# Patient Record
Sex: Female | Born: 1935 | Race: White | Hispanic: No | Smoking: Never smoker
Health system: Southern US, Community
[De-identification: ages and names within clinical notes are randomized; demographics above are authoritative.]

## PROBLEM LIST (undated history)

## (undated) DIAGNOSIS — N183 Chronic kidney disease, stage 3 (moderate): Secondary | ICD-10-CM

## (undated) DIAGNOSIS — I2510 Atherosclerotic heart disease of native coronary artery without angina pectoris: Secondary | ICD-10-CM

## (undated) DIAGNOSIS — M179 Osteoarthritis of knee, unspecified: Secondary | ICD-10-CM

## (undated) DIAGNOSIS — I255 Ischemic cardiomyopathy: Secondary | ICD-10-CM

## (undated) DIAGNOSIS — E119 Type 2 diabetes mellitus without complications: Secondary | ICD-10-CM

## (undated) DIAGNOSIS — D631 Anemia in chronic kidney disease: Secondary | ICD-10-CM

## (undated) DIAGNOSIS — D638 Anemia in other chronic diseases classified elsewhere: Secondary | ICD-10-CM

## (undated) DIAGNOSIS — C9110 Chronic lymphocytic leukemia of B-cell type not having achieved remission: Secondary | ICD-10-CM

## (undated) DIAGNOSIS — Z5189 Encounter for other specified aftercare: Secondary | ICD-10-CM

## (undated) DIAGNOSIS — D649 Anemia, unspecified: Secondary | ICD-10-CM

## (undated) DIAGNOSIS — R413 Other amnesia: Secondary | ICD-10-CM

## (undated) DIAGNOSIS — E039 Hypothyroidism, unspecified: Secondary | ICD-10-CM

## (undated) DIAGNOSIS — E7800 Pure hypercholesterolemia, unspecified: Secondary | ICD-10-CM

## (undated) DIAGNOSIS — IMO0001 Reserved for inherently not codable concepts without codable children: Secondary | ICD-10-CM

## (undated) DIAGNOSIS — I509 Heart failure, unspecified: Secondary | ICD-10-CM

## (undated) DIAGNOSIS — M1710 Unilateral primary osteoarthritis, unspecified knee: Secondary | ICD-10-CM

## (undated) DIAGNOSIS — I10 Essential (primary) hypertension: Secondary | ICD-10-CM

## (undated) DIAGNOSIS — R0602 Shortness of breath: Secondary | ICD-10-CM

## (undated) HISTORY — DX: Hypothyroidism, unspecified: E03.9

## (undated) HISTORY — DX: Anemia, unspecified: D64.9

## (undated) HISTORY — PX: OTHER SURGICAL HISTORY: SHX169

## (undated) HISTORY — DX: Osteoarthritis of knee, unspecified: M17.9

## (undated) HISTORY — DX: Chronic lymphocytic leukemia of B-cell type not having achieved remission: C91.10

## (undated) HISTORY — DX: Ischemic cardiomyopathy: I25.5

## (undated) HISTORY — DX: Type 2 diabetes mellitus without complications: E11.9

## (undated) HISTORY — DX: Other amnesia: R41.3

## (undated) HISTORY — PX: THYROIDECTOMY: SHX17

## (undated) HISTORY — DX: Unilateral primary osteoarthritis, unspecified knee: M17.10

## (undated) HISTORY — DX: Anemia in chronic kidney disease: D63.1

## (undated) HISTORY — DX: Atherosclerotic heart disease of native coronary artery without angina pectoris: I25.10

## (undated) HISTORY — DX: Anemia in other chronic diseases classified elsewhere: D63.8

## (undated) HISTORY — DX: Pure hypercholesterolemia, unspecified: E78.00

## (undated) HISTORY — PX: EYE SURGERY: SHX253

## (undated) HISTORY — PX: CATARACT EXTRACTION, BILATERAL: SHX1313

## (undated) HISTORY — DX: Chronic kidney disease, stage 3 (moderate): N18.3

## (undated) HISTORY — DX: Essential (primary) hypertension: I10

## (undated) HISTORY — PX: REPLACEMENT TOTAL KNEE BILATERAL: SUR1225

---

## 2000-01-27 HISTORY — PX: COLONOSCOPY: SHX174

## 2007-04-02 ENCOUNTER — Ambulatory Visit: Payer: Self-pay | Admitting: Family Medicine

## 2007-04-02 DIAGNOSIS — H269 Unspecified cataract: Secondary | ICD-10-CM | POA: Insufficient documentation

## 2007-04-02 DIAGNOSIS — E1165 Type 2 diabetes mellitus with hyperglycemia: Secondary | ICD-10-CM

## 2007-04-02 DIAGNOSIS — I10 Essential (primary) hypertension: Secondary | ICD-10-CM

## 2007-04-02 DIAGNOSIS — M129 Arthropathy, unspecified: Secondary | ICD-10-CM | POA: Insufficient documentation

## 2007-04-02 DIAGNOSIS — M545 Low back pain: Secondary | ICD-10-CM

## 2007-04-02 DIAGNOSIS — F329 Major depressive disorder, single episode, unspecified: Secondary | ICD-10-CM

## 2007-04-02 DIAGNOSIS — E039 Hypothyroidism, unspecified: Secondary | ICD-10-CM | POA: Insufficient documentation

## 2007-04-02 DIAGNOSIS — E785 Hyperlipidemia, unspecified: Secondary | ICD-10-CM | POA: Insufficient documentation

## 2007-04-02 LAB — CONVERTED CEMR LAB: Hgb A1c MFr Bld: 8 %

## 2007-04-03 ENCOUNTER — Telehealth (INDEPENDENT_AMBULATORY_CARE_PROVIDER_SITE_OTHER): Payer: Self-pay | Admitting: *Deleted

## 2007-04-03 ENCOUNTER — Encounter (INDEPENDENT_AMBULATORY_CARE_PROVIDER_SITE_OTHER): Payer: Self-pay | Admitting: Family Medicine

## 2007-04-03 DIAGNOSIS — C9110 Chronic lymphocytic leukemia of B-cell type not having achieved remission: Secondary | ICD-10-CM

## 2007-04-03 HISTORY — DX: Chronic lymphocytic leukemia of B-cell type not having achieved remission: C91.10

## 2007-04-03 LAB — CONVERTED CEMR LAB
Basophils Absolute: 0 10*3/uL (ref 0.0–0.1)
Basophils Relative: 0 % (ref 0–1)
Eosinophils Absolute: 0.2 10*3/uL (ref 0.0–0.7)
HCT: 32.7 % — ABNORMAL LOW (ref 36.0–46.0)
Lymphocytes Relative: 76 % — ABNORMAL HIGH (ref 12–46)
Lymphs Abs: 9.6 10*3/uL — ABNORMAL HIGH (ref 0.7–3.3)
MCHC: 31.2 g/dL (ref 30.0–36.0)
Monocytes Absolute: 0.2 10*3/uL (ref 0.2–0.7)
Neutro Abs: 2.6 10*3/uL (ref 1.7–7.7)
RDW: 14.4 % — ABNORMAL HIGH (ref 11.5–14.0)
Retic Ct Pct: 0.9 % (ref 0.4–3.1)
TIBC: 313 ug/dL (ref 250–470)
UIBC: 256 ug/dL
WBC: 12.6 10*3/uL — ABNORMAL HIGH (ref 4.0–10.5)

## 2007-04-08 ENCOUNTER — Encounter (INDEPENDENT_AMBULATORY_CARE_PROVIDER_SITE_OTHER): Payer: Self-pay | Admitting: Family Medicine

## 2007-04-09 ENCOUNTER — Encounter (INDEPENDENT_AMBULATORY_CARE_PROVIDER_SITE_OTHER): Payer: Self-pay | Admitting: Family Medicine

## 2007-04-10 ENCOUNTER — Encounter (INDEPENDENT_AMBULATORY_CARE_PROVIDER_SITE_OTHER): Payer: Self-pay | Admitting: Family Medicine

## 2007-04-15 ENCOUNTER — Ambulatory Visit: Payer: Self-pay | Admitting: Family Medicine

## 2007-04-15 ENCOUNTER — Telehealth (INDEPENDENT_AMBULATORY_CARE_PROVIDER_SITE_OTHER): Payer: Self-pay | Admitting: *Deleted

## 2007-05-14 ENCOUNTER — Encounter (HOSPITAL_COMMUNITY): Payer: Self-pay | Admitting: Oncology

## 2007-05-14 ENCOUNTER — Ambulatory Visit (HOSPITAL_COMMUNITY): Payer: Self-pay | Admitting: Oncology

## 2007-05-14 ENCOUNTER — Encounter (INDEPENDENT_AMBULATORY_CARE_PROVIDER_SITE_OTHER): Payer: Self-pay | Admitting: Family Medicine

## 2007-05-14 ENCOUNTER — Encounter (HOSPITAL_COMMUNITY): Admission: RE | Admit: 2007-05-14 | Discharge: 2007-05-28 | Payer: Self-pay | Admitting: Oncology

## 2007-05-27 ENCOUNTER — Ambulatory Visit: Payer: Self-pay | Admitting: Family Medicine

## 2007-05-27 LAB — CONVERTED CEMR LAB: LDL Goal: 100 mg/dL

## 2007-06-06 ENCOUNTER — Telehealth (INDEPENDENT_AMBULATORY_CARE_PROVIDER_SITE_OTHER): Payer: Self-pay | Admitting: Family Medicine

## 2007-06-23 ENCOUNTER — Ambulatory Visit: Payer: Self-pay | Admitting: Family Medicine

## 2007-07-11 ENCOUNTER — Encounter (HOSPITAL_COMMUNITY): Admission: RE | Admit: 2007-07-11 | Discharge: 2007-08-10 | Payer: Self-pay | Admitting: Oncology

## 2007-07-11 ENCOUNTER — Ambulatory Visit (HOSPITAL_COMMUNITY): Payer: Self-pay | Admitting: Oncology

## 2007-07-16 ENCOUNTER — Encounter (INDEPENDENT_AMBULATORY_CARE_PROVIDER_SITE_OTHER): Payer: Self-pay | Admitting: Family Medicine

## 2007-07-24 ENCOUNTER — Ambulatory Visit: Payer: Self-pay | Admitting: Family Medicine

## 2007-08-28 ENCOUNTER — Encounter (INDEPENDENT_AMBULATORY_CARE_PROVIDER_SITE_OTHER): Payer: Self-pay | Admitting: Family Medicine

## 2007-08-29 ENCOUNTER — Telehealth (INDEPENDENT_AMBULATORY_CARE_PROVIDER_SITE_OTHER): Payer: Self-pay | Admitting: *Deleted

## 2007-08-29 ENCOUNTER — Encounter (INDEPENDENT_AMBULATORY_CARE_PROVIDER_SITE_OTHER): Payer: Self-pay | Admitting: Family Medicine

## 2007-08-29 LAB — CONVERTED CEMR LAB
AST: 17 units/L (ref 0–37)
BUN: 21 mg/dL (ref 6–23)
CO2: 22 meq/L (ref 19–32)
Chloride: 106 meq/L (ref 96–112)
Cholesterol: 152 mg/dL (ref 0–200)
LDL Cholesterol: 87 mg/dL (ref 0–99)
Total Bilirubin: 0.4 mg/dL (ref 0.3–1.2)
Total Protein: 6.7 g/dL (ref 6.0–8.3)
Triglycerides: 116 mg/dL (ref <150)

## 2007-09-04 ENCOUNTER — Ambulatory Visit: Payer: Self-pay | Admitting: Family Medicine

## 2007-09-08 ENCOUNTER — Telehealth (INDEPENDENT_AMBULATORY_CARE_PROVIDER_SITE_OTHER): Payer: Self-pay | Admitting: *Deleted

## 2007-09-11 ENCOUNTER — Telehealth (INDEPENDENT_AMBULATORY_CARE_PROVIDER_SITE_OTHER): Payer: Self-pay | Admitting: *Deleted

## 2007-09-11 ENCOUNTER — Encounter (INDEPENDENT_AMBULATORY_CARE_PROVIDER_SITE_OTHER): Payer: Self-pay | Admitting: Family Medicine

## 2007-09-11 LAB — CONVERTED CEMR LAB: Potassium: 4.5 meq/L (ref 3.5–5.3)

## 2007-11-03 ENCOUNTER — Telehealth (INDEPENDENT_AMBULATORY_CARE_PROVIDER_SITE_OTHER): Payer: Self-pay | Admitting: *Deleted

## 2007-11-06 ENCOUNTER — Ambulatory Visit: Payer: Self-pay | Admitting: Family Medicine

## 2007-11-06 LAB — CONVERTED CEMR LAB
Glucose, Bld: 75 mg/dL
Hgb A1c MFr Bld: 7.6 %

## 2008-01-05 ENCOUNTER — Encounter (HOSPITAL_COMMUNITY): Admission: RE | Admit: 2008-01-05 | Discharge: 2008-02-04 | Payer: Self-pay | Admitting: Oncology

## 2008-01-05 ENCOUNTER — Ambulatory Visit (HOSPITAL_COMMUNITY): Payer: Self-pay | Admitting: Oncology

## 2008-01-12 ENCOUNTER — Ambulatory Visit: Payer: Self-pay | Admitting: Family Medicine

## 2008-01-12 LAB — CONVERTED CEMR LAB: Blood Glucose, Fasting: 206 mg/dL

## 2008-01-14 ENCOUNTER — Encounter (INDEPENDENT_AMBULATORY_CARE_PROVIDER_SITE_OTHER): Payer: Self-pay | Admitting: Family Medicine

## 2008-01-20 ENCOUNTER — Ambulatory Visit (HOSPITAL_COMMUNITY): Admission: RE | Admit: 2008-01-20 | Discharge: 2008-01-20 | Payer: Self-pay | Admitting: Ophthalmology

## 2008-02-17 ENCOUNTER — Ambulatory Visit: Payer: Self-pay | Admitting: Family Medicine

## 2008-02-23 ENCOUNTER — Ambulatory Visit: Payer: Self-pay | Admitting: Family Medicine

## 2008-02-23 LAB — CONVERTED CEMR LAB
Glucose, Bld: 117 mg/dL
Hgb A1c MFr Bld: 7.7 %

## 2008-02-24 ENCOUNTER — Encounter (INDEPENDENT_AMBULATORY_CARE_PROVIDER_SITE_OTHER): Payer: Self-pay | Admitting: Family Medicine

## 2008-02-24 LAB — CONVERTED CEMR LAB
ALT: 12 units/L (ref 0–35)
AST: 22 units/L (ref 0–37)
Alkaline Phosphatase: 71 units/L (ref 39–117)
BUN: 20 mg/dL (ref 6–23)
Basophils Absolute: 0 10*3/uL (ref 0.0–0.1)
Basophils Relative: 0 % (ref 0–1)
Calcium: 9.3 mg/dL (ref 8.4–10.5)
Chloride: 106 meq/L (ref 96–112)
Creatinine, Ser: 1.08 mg/dL (ref 0.40–1.20)
Creatinine, Urine: 68.1 mg/dL
Eosinophils Relative: 1 % (ref 0–5)
Lymphocytes Relative: 67 % — ABNORMAL HIGH (ref 12–46)
MCHC: 31 g/dL (ref 30.0–36.0)
Microalb Creat Ratio: 28.2 mg/g (ref 0.0–30.0)
Monocytes Absolute: 0.4 10*3/uL (ref 0.1–1.0)
Neutro Abs: 3.3 10*3/uL (ref 1.7–7.7)
Platelets: 239 10*3/uL (ref 150–400)
Potassium: 4.8 meq/L (ref 3.5–5.3)
RDW: 14.5 % (ref 11.5–15.5)

## 2008-05-03 ENCOUNTER — Encounter (INDEPENDENT_AMBULATORY_CARE_PROVIDER_SITE_OTHER): Payer: Self-pay | Admitting: Family Medicine

## 2008-06-15 ENCOUNTER — Ambulatory Visit: Payer: Self-pay | Admitting: Family Medicine

## 2008-06-15 DIAGNOSIS — R413 Other amnesia: Secondary | ICD-10-CM | POA: Insufficient documentation

## 2008-06-15 DIAGNOSIS — R159 Full incontinence of feces: Secondary | ICD-10-CM | POA: Insufficient documentation

## 2008-06-15 LAB — CONVERTED CEMR LAB: Hgb A1c MFr Bld: 8.1 %

## 2008-06-21 ENCOUNTER — Telehealth (INDEPENDENT_AMBULATORY_CARE_PROVIDER_SITE_OTHER): Payer: Self-pay | Admitting: *Deleted

## 2008-06-23 ENCOUNTER — Encounter (INDEPENDENT_AMBULATORY_CARE_PROVIDER_SITE_OTHER): Payer: Self-pay | Admitting: Family Medicine

## 2008-06-23 ENCOUNTER — Ambulatory Visit (HOSPITAL_COMMUNITY): Admission: RE | Admit: 2008-06-23 | Discharge: 2008-06-23 | Payer: Self-pay | Admitting: Family Medicine

## 2008-07-13 ENCOUNTER — Encounter (INDEPENDENT_AMBULATORY_CARE_PROVIDER_SITE_OTHER): Payer: Self-pay | Admitting: Family Medicine

## 2008-07-13 ENCOUNTER — Ambulatory Visit (HOSPITAL_COMMUNITY): Payer: Self-pay | Admitting: Oncology

## 2008-07-13 ENCOUNTER — Encounter (HOSPITAL_COMMUNITY): Admission: RE | Admit: 2008-07-13 | Discharge: 2008-08-12 | Payer: Self-pay | Admitting: Oncology

## 2008-07-14 ENCOUNTER — Encounter (INDEPENDENT_AMBULATORY_CARE_PROVIDER_SITE_OTHER): Payer: Self-pay | Admitting: Family Medicine

## 2008-09-17 ENCOUNTER — Encounter (INDEPENDENT_AMBULATORY_CARE_PROVIDER_SITE_OTHER): Payer: Self-pay | Admitting: Family Medicine

## 2008-10-21 ENCOUNTER — Encounter (INDEPENDENT_AMBULATORY_CARE_PROVIDER_SITE_OTHER): Payer: Self-pay | Admitting: *Deleted

## 2009-01-07 ENCOUNTER — Encounter (HOSPITAL_COMMUNITY): Admission: RE | Admit: 2009-01-07 | Discharge: 2009-02-06 | Payer: Self-pay | Admitting: Oncology

## 2009-01-07 ENCOUNTER — Ambulatory Visit (HOSPITAL_COMMUNITY): Payer: Self-pay | Admitting: Oncology

## 2009-01-17 ENCOUNTER — Ambulatory Visit: Payer: Self-pay | Admitting: Family Medicine

## 2009-01-17 DIAGNOSIS — M67919 Unspecified disorder of synovium and tendon, unspecified shoulder: Secondary | ICD-10-CM | POA: Insufficient documentation

## 2009-01-17 DIAGNOSIS — M719 Bursopathy, unspecified: Secondary | ICD-10-CM

## 2009-01-17 LAB — CONVERTED CEMR LAB: Hgb A1c MFr Bld: 7.5 %

## 2009-01-19 ENCOUNTER — Encounter (INDEPENDENT_AMBULATORY_CARE_PROVIDER_SITE_OTHER): Payer: Self-pay | Admitting: Family Medicine

## 2009-01-20 LAB — CONVERTED CEMR LAB
ALT: 12 units/L (ref 0–35)
AST: 22 units/L (ref 0–37)
Albumin: 3.9 g/dL (ref 3.5–5.2)
Alkaline Phosphatase: 46 units/L (ref 39–117)
Calcium: 9.4 mg/dL (ref 8.4–10.5)
Chloride: 108 meq/L (ref 96–112)
HDL: 40 mg/dL (ref >39)
LDL Cholesterol: 75 mg/dL (ref 0–99)
Potassium: 4.7 meq/L (ref 3.5–5.3)
Sodium: 142 meq/L (ref 135–145)
TSH: 1.125 microintl units/mL (ref 0.350–4.500)
Total Protein: 6.4 g/dL (ref 6.0–8.3)

## 2009-02-03 ENCOUNTER — Encounter (INDEPENDENT_AMBULATORY_CARE_PROVIDER_SITE_OTHER): Payer: Self-pay | Admitting: Family Medicine

## 2009-06-10 ENCOUNTER — Ambulatory Visit (HOSPITAL_COMMUNITY): Admission: RE | Admit: 2009-06-10 | Discharge: 2009-06-10 | Payer: Self-pay | Admitting: Cardiology

## 2009-06-13 ENCOUNTER — Encounter (INDEPENDENT_AMBULATORY_CARE_PROVIDER_SITE_OTHER): Payer: Self-pay | Admitting: Cardiology

## 2009-06-14 ENCOUNTER — Inpatient Hospital Stay (HOSPITAL_COMMUNITY): Admission: RE | Admit: 2009-06-14 | Discharge: 2009-06-25 | Payer: Self-pay | Admitting: Cardiology

## 2009-06-14 ENCOUNTER — Ambulatory Visit: Payer: Self-pay | Admitting: Thoracic Surgery (Cardiothoracic Vascular Surgery)

## 2009-06-14 HISTORY — PX: CARDIAC CATHETERIZATION: SHX172

## 2009-06-15 ENCOUNTER — Encounter (INDEPENDENT_AMBULATORY_CARE_PROVIDER_SITE_OTHER): Payer: Self-pay | Admitting: Cardiology

## 2009-06-22 HISTORY — PX: CORONARY ARTERY BYPASS GRAFT: SHX141

## 2009-07-11 ENCOUNTER — Encounter
Admission: RE | Admit: 2009-07-11 | Discharge: 2009-07-11 | Payer: Self-pay | Admitting: Thoracic Surgery (Cardiothoracic Vascular Surgery)

## 2009-07-11 ENCOUNTER — Ambulatory Visit: Payer: Self-pay | Admitting: Thoracic Surgery (Cardiothoracic Vascular Surgery)

## 2009-07-25 ENCOUNTER — Encounter (HOSPITAL_COMMUNITY): Admission: RE | Admit: 2009-07-25 | Discharge: 2009-08-24 | Payer: Self-pay | Admitting: Cardiology

## 2009-08-15 ENCOUNTER — Ambulatory Visit (HOSPITAL_COMMUNITY): Payer: Self-pay | Admitting: Oncology

## 2009-08-19 ENCOUNTER — Ambulatory Visit (HOSPITAL_COMMUNITY): Admission: RE | Admit: 2009-08-19 | Discharge: 2009-08-19 | Payer: Self-pay | Admitting: Cardiology

## 2009-08-24 ENCOUNTER — Encounter (HOSPITAL_COMMUNITY): Admission: RE | Admit: 2009-08-24 | Discharge: 2009-09-23 | Payer: Self-pay | Admitting: Cardiology

## 2009-09-11 ENCOUNTER — Emergency Department (HOSPITAL_COMMUNITY): Admission: EM | Admit: 2009-09-11 | Discharge: 2009-09-11 | Payer: Self-pay | Admitting: Emergency Medicine

## 2010-01-31 ENCOUNTER — Ambulatory Visit (HOSPITAL_COMMUNITY): Payer: Self-pay | Admitting: Oncology

## 2010-01-31 ENCOUNTER — Encounter (HOSPITAL_COMMUNITY): Admission: RE | Admit: 2010-01-31 | Discharge: 2010-01-31 | Payer: Self-pay | Admitting: Oncology

## 2010-06-18 ENCOUNTER — Encounter: Payer: Self-pay | Admitting: Family Medicine

## 2010-08-10 LAB — CBC
HCT: 27.2 % — ABNORMAL LOW (ref 36.0–46.0)
Hemoglobin: 9.1 g/dL — ABNORMAL LOW (ref 12.0–15.0)
MCHC: 33.3 g/dL (ref 30.0–36.0)
RBC: 2.98 MIL/uL — ABNORMAL LOW (ref 3.87–5.11)
WBC: 11.9 10*3/uL — ABNORMAL HIGH (ref 4.0–10.5)

## 2010-08-10 LAB — DIFFERENTIAL
Basophils Relative: 0 % (ref 0–1)
Lymphocytes Relative: 76 % — ABNORMAL HIGH (ref 12–46)
Lymphs Abs: 9 10*3/uL — ABNORMAL HIGH (ref 0.7–4.0)
Monocytes Relative: 3 % (ref 3–12)
Neutro Abs: 2.4 10*3/uL (ref 1.7–7.7)

## 2010-08-14 ENCOUNTER — Other Ambulatory Visit (HOSPITAL_COMMUNITY): Payer: Medicare Other

## 2010-08-14 ENCOUNTER — Encounter (HOSPITAL_COMMUNITY): Payer: Medicare Other | Attending: Oncology

## 2010-08-14 DIAGNOSIS — C9110 Chronic lymphocytic leukemia of B-cell type not having achieved remission: Secondary | ICD-10-CM | POA: Insufficient documentation

## 2010-08-14 DIAGNOSIS — D649 Anemia, unspecified: Secondary | ICD-10-CM | POA: Insufficient documentation

## 2010-08-14 LAB — POCT I-STAT, CHEM 8
BUN: 14 mg/dL (ref 6–23)
Calcium, Ion: 1.17 mmol/L (ref 1.12–1.32)
Chloride: 109 mEq/L (ref 96–112)
Chloride: 112 mEq/L (ref 96–112)
Creatinine, Ser: 1.1 mg/dL (ref 0.4–1.2)
Glucose, Bld: 156 mg/dL — ABNORMAL HIGH (ref 70–99)
HCT: 29 % — ABNORMAL LOW (ref 36.0–46.0)
Potassium: 4.4 mEq/L (ref 3.5–5.1)
Sodium: 142 mEq/L (ref 135–145)

## 2010-08-14 LAB — GLUCOSE, CAPILLARY
Glucose-Capillary: 102 mg/dL — ABNORMAL HIGH (ref 70–99)
Glucose-Capillary: 107 mg/dL — ABNORMAL HIGH (ref 70–99)
Glucose-Capillary: 109 mg/dL — ABNORMAL HIGH (ref 70–99)
Glucose-Capillary: 112 mg/dL — ABNORMAL HIGH (ref 70–99)
Glucose-Capillary: 129 mg/dL — ABNORMAL HIGH (ref 70–99)
Glucose-Capillary: 130 mg/dL — ABNORMAL HIGH (ref 70–99)
Glucose-Capillary: 132 mg/dL — ABNORMAL HIGH (ref 70–99)
Glucose-Capillary: 143 mg/dL — ABNORMAL HIGH (ref 70–99)
Glucose-Capillary: 150 mg/dL — ABNORMAL HIGH (ref 70–99)
Glucose-Capillary: 165 mg/dL — ABNORMAL HIGH (ref 70–99)
Glucose-Capillary: 168 mg/dL — ABNORMAL HIGH (ref 70–99)
Glucose-Capillary: 169 mg/dL — ABNORMAL HIGH (ref 70–99)
Glucose-Capillary: 179 mg/dL — ABNORMAL HIGH (ref 70–99)
Glucose-Capillary: 180 mg/dL — ABNORMAL HIGH (ref 70–99)
Glucose-Capillary: 180 mg/dL — ABNORMAL HIGH (ref 70–99)
Glucose-Capillary: 182 mg/dL — ABNORMAL HIGH (ref 70–99)
Glucose-Capillary: 182 mg/dL — ABNORMAL HIGH (ref 70–99)
Glucose-Capillary: 185 mg/dL — ABNORMAL HIGH (ref 70–99)
Glucose-Capillary: 187 mg/dL — ABNORMAL HIGH (ref 70–99)
Glucose-Capillary: 195 mg/dL — ABNORMAL HIGH (ref 70–99)
Glucose-Capillary: 202 mg/dL — ABNORMAL HIGH (ref 70–99)
Glucose-Capillary: 203 mg/dL — ABNORMAL HIGH (ref 70–99)
Glucose-Capillary: 232 mg/dL — ABNORMAL HIGH (ref 70–99)
Glucose-Capillary: 255 mg/dL — ABNORMAL HIGH (ref 70–99)
Glucose-Capillary: 308 mg/dL — ABNORMAL HIGH (ref 70–99)
Glucose-Capillary: 65 mg/dL — ABNORMAL LOW (ref 70–99)
Glucose-Capillary: 69 mg/dL — ABNORMAL LOW (ref 70–99)
Glucose-Capillary: 69 mg/dL — ABNORMAL LOW (ref 70–99)
Glucose-Capillary: 79 mg/dL (ref 70–99)
Glucose-Capillary: 79 mg/dL (ref 70–99)
Glucose-Capillary: 84 mg/dL (ref 70–99)
Glucose-Capillary: 85 mg/dL (ref 70–99)
Glucose-Capillary: 94 mg/dL (ref 70–99)
Glucose-Capillary: 98 mg/dL (ref 70–99)

## 2010-08-14 LAB — APTT
aPTT: 26 s (ref 24–37)
aPTT: 36 seconds (ref 24–37)

## 2010-08-14 LAB — COMPREHENSIVE METABOLIC PANEL WITH GFR
ALT: 12 U/L (ref 0–35)
AST: 24 U/L (ref 0–37)
Albumin: 3.3 g/dL — ABNORMAL LOW (ref 3.5–5.2)
Alkaline Phosphatase: 48 U/L (ref 39–117)
BUN: 19 mg/dL (ref 6–23)
CO2: 22 meq/L (ref 19–32)
Calcium: 8.7 mg/dL (ref 8.4–10.5)
Chloride: 108 meq/L (ref 96–112)
Creatinine, Ser: 1.23 mg/dL — ABNORMAL HIGH (ref 0.4–1.2)
GFR calc non Af Amer: 42 mL/min — ABNORMAL LOW
Glucose, Bld: 284 mg/dL — ABNORMAL HIGH (ref 70–99)
Potassium: 3.7 meq/L (ref 3.5–5.1)
Sodium: 137 meq/L (ref 135–145)
Total Bilirubin: 0.4 mg/dL (ref 0.3–1.2)
Total Protein: 6.1 g/dL (ref 6.0–8.3)

## 2010-08-14 LAB — BASIC METABOLIC PANEL
BUN: 33 mg/dL — ABNORMAL HIGH (ref 6–23)
BUN: 33 mg/dL — ABNORMAL HIGH (ref 6–23)
BUN: 34 mg/dL — ABNORMAL HIGH (ref 6–23)
CO2: 21 mEq/L (ref 19–32)
CO2: 22 mEq/L (ref 19–32)
CO2: 22 mEq/L (ref 19–32)
CO2: 22 mEq/L (ref 19–32)
CO2: 23 mEq/L (ref 19–32)
CO2: 23 mEq/L (ref 19–32)
Calcium: 7.8 mg/dL — ABNORMAL LOW (ref 8.4–10.5)
Calcium: 8.1 mg/dL — ABNORMAL LOW (ref 8.4–10.5)
Calcium: 8.1 mg/dL — ABNORMAL LOW (ref 8.4–10.5)
Calcium: 8.2 mg/dL — ABNORMAL LOW (ref 8.4–10.5)
Calcium: 8.2 mg/dL — ABNORMAL LOW (ref 8.4–10.5)
Calcium: 8.3 mg/dL — ABNORMAL LOW (ref 8.4–10.5)
Calcium: 8.6 mg/dL (ref 8.4–10.5)
Calcium: 8.7 mg/dL (ref 8.4–10.5)
Calcium: 8.8 mg/dL (ref 8.4–10.5)
Chloride: 107 mEq/L (ref 96–112)
Chloride: 107 mEq/L (ref 96–112)
Chloride: 109 mEq/L (ref 96–112)
Chloride: 109 mEq/L (ref 96–112)
Chloride: 111 mEq/L (ref 96–112)
Chloride: 112 mEq/L (ref 96–112)
Creatinine, Ser: 1.07 mg/dL (ref 0.4–1.2)
Creatinine, Ser: 1.46 mg/dL — ABNORMAL HIGH (ref 0.4–1.2)
Creatinine, Ser: 1.66 mg/dL — ABNORMAL HIGH (ref 0.4–1.2)
Creatinine, Ser: 1.8 mg/dL — ABNORMAL HIGH (ref 0.4–1.2)
Creatinine, Ser: 2.14 mg/dL — ABNORMAL HIGH (ref 0.4–1.2)
GFR calc Af Amer: 27 mL/min — ABNORMAL LOW (ref >60)
GFR calc Af Amer: 32 mL/min — ABNORMAL LOW (ref >60)
GFR calc Af Amer: 33 mL/min — ABNORMAL LOW (ref >60)
GFR calc Af Amer: 36 mL/min — ABNORMAL LOW (ref >60)
GFR calc Af Amer: 42 mL/min — ABNORMAL LOW (ref >60)
GFR calc Af Amer: 46 mL/min — ABNORMAL LOW (ref >60)
GFR calc Af Amer: 50 mL/min — ABNORMAL LOW (ref >60)
GFR calc Af Amer: 60 mL/min — ABNORMAL LOW (ref >60)
GFR calc non Af Amer: 22 mL/min — ABNORMAL LOW (ref >60)
GFR calc non Af Amer: 27 mL/min — ABNORMAL LOW (ref >60)
GFR calc non Af Amer: 27 mL/min — ABNORMAL LOW (ref >60)
GFR calc non Af Amer: 30 mL/min — ABNORMAL LOW (ref >60)
GFR calc non Af Amer: 42 mL/min — ABNORMAL LOW (ref >60)
GFR calc non Af Amer: 47 mL/min — ABNORMAL LOW (ref >60)
GFR calc non Af Amer: 49 mL/min — ABNORMAL LOW (ref >60)
Glucose, Bld: 117 mg/dL — ABNORMAL HIGH (ref 70–99)
Glucose, Bld: 122 mg/dL — ABNORMAL HIGH (ref 70–99)
Glucose, Bld: 157 mg/dL — ABNORMAL HIGH (ref 70–99)
Glucose, Bld: 58 mg/dL — ABNORMAL LOW (ref 70–99)
Glucose, Bld: 86 mg/dL (ref 70–99)
Glucose, Bld: 93 mg/dL (ref 70–99)
Potassium: 3.9 mEq/L (ref 3.5–5.1)
Potassium: 3.9 mEq/L (ref 3.5–5.1)
Potassium: 4.1 mEq/L (ref 3.5–5.1)
Potassium: 4.3 mEq/L (ref 3.5–5.1)
Sodium: 135 mEq/L (ref 135–145)
Sodium: 136 mEq/L (ref 135–145)
Sodium: 137 mEq/L (ref 135–145)
Sodium: 137 mEq/L (ref 135–145)
Sodium: 138 mEq/L (ref 135–145)
Sodium: 138 mEq/L (ref 135–145)
Sodium: 139 mEq/L (ref 135–145)
Sodium: 139 mEq/L (ref 135–145)

## 2010-08-14 LAB — ABO/RH: ABO/RH(D): O POS

## 2010-08-14 LAB — URINALYSIS, ROUTINE W REFLEX MICROSCOPIC
Bilirubin Urine: NEGATIVE
Glucose, UA: NEGATIVE mg/dL
Hgb urine dipstick: NEGATIVE
Ketones, ur: NEGATIVE mg/dL
Nitrite: POSITIVE — AB
Protein, ur: NEGATIVE mg/dL
Specific Gravity, Urine: 1.01 (ref 1.005–1.030)
Urobilinogen, UA: 0.2 mg/dL (ref 0.0–1.0)
pH: 6 (ref 5.0–8.0)

## 2010-08-14 LAB — TYPE AND SCREEN
ABO/RH(D): O POS
Antibody Screen: NEGATIVE

## 2010-08-14 LAB — PROTIME-INR
INR: 1.14 (ref 0.00–1.49)
INR: 1.54 — ABNORMAL HIGH (ref 0.00–1.49)
Prothrombin Time: 14.5 s (ref 11.6–15.2)
Prothrombin Time: 18.4 seconds — ABNORMAL HIGH (ref 11.6–15.2)

## 2010-08-14 LAB — CBC
HCT: 26.9 % — ABNORMAL LOW (ref 36.0–46.0)
HCT: 27.8 % — ABNORMAL LOW (ref 36.0–46.0)
HCT: 28.9 % — ABNORMAL LOW (ref 36.0–46.0)
HCT: 30.1 % — ABNORMAL LOW (ref 36.0–46.0)
Hemoglobin: 10.1 g/dL — ABNORMAL LOW (ref 12.0–15.0)
Hemoglobin: 10.3 g/dL — ABNORMAL LOW (ref 12.0–15.0)
Hemoglobin: 7.4 g/dL — ABNORMAL LOW (ref 12.0–15.0)
Hemoglobin: 9 g/dL — ABNORMAL LOW (ref 12.0–15.0)
Hemoglobin: 9 g/dL — ABNORMAL LOW (ref 12.0–15.0)
Hemoglobin: 9.3 g/dL — ABNORMAL LOW (ref 12.0–15.0)
Hemoglobin: 9.6 g/dL — ABNORMAL LOW (ref 12.0–15.0)
Hemoglobin: 9.8 g/dL — ABNORMAL LOW (ref 12.0–15.0)
MCHC: 33.6 g/dL (ref 30.0–36.0)
MCHC: 33.6 g/dL (ref 30.0–36.0)
MCHC: 33.6 g/dL (ref 30.0–36.0)
MCHC: 33.9 g/dL (ref 30.0–36.0)
MCHC: 34 g/dL (ref 30.0–36.0)
MCHC: 34.1 g/dL (ref 30.0–36.0)
MCHC: 34.5 g/dL (ref 30.0–36.0)
MCHC: 34.7 g/dL (ref 30.0–36.0)
MCV: 88.8 fL (ref 78.0–100.0)
MCV: 90 fL (ref 78.0–100.0)
MCV: 90.5 fL (ref 78.0–100.0)
MCV: 90.7 fL (ref 78.0–100.0)
MCV: 90.7 fL (ref 78.0–100.0)
Platelets: 109 10*3/uL — ABNORMAL LOW (ref 150–400)
Platelets: 115 10*3/uL — ABNORMAL LOW (ref 150–400)
Platelets: 127 10*3/uL — ABNORMAL LOW (ref 150–400)
Platelets: 141 10*3/uL — ABNORMAL LOW (ref 150–400)
Platelets: 149 10*3/uL — ABNORMAL LOW (ref 150–400)
RBC: 2.48 MIL/uL — ABNORMAL LOW (ref 3.87–5.11)
RBC: 2.9 MIL/uL — ABNORMAL LOW (ref 3.87–5.11)
RBC: 2.96 MIL/uL — ABNORMAL LOW (ref 3.87–5.11)
RBC: 3.02 MIL/uL — ABNORMAL LOW (ref 3.87–5.11)
RBC: 3.04 MIL/uL — ABNORMAL LOW (ref 3.87–5.11)
RBC: 3.08 MIL/uL — ABNORMAL LOW (ref 3.87–5.11)
RBC: 3.26 MIL/uL — ABNORMAL LOW (ref 3.87–5.11)
RBC: 3.33 MIL/uL — ABNORMAL LOW (ref 3.87–5.11)
RDW: 14.3 % (ref 11.5–15.5)
RDW: 14.4 % (ref 11.5–15.5)
RDW: 14.5 % (ref 11.5–15.5)
RDW: 14.6 % (ref 11.5–15.5)
RDW: 14.6 % (ref 11.5–15.5)
RDW: 14.8 % (ref 11.5–15.5)
WBC: 12.8 10*3/uL — ABNORMAL HIGH (ref 4.0–10.5)
WBC: 19.7 10*3/uL — ABNORMAL HIGH (ref 4.0–10.5)
WBC: 7.9 10*3/uL (ref 4.0–10.5)
WBC: 8 10*3/uL (ref 4.0–10.5)
WBC: 9.5 10*3/uL (ref 4.0–10.5)
WBC: 9.5 10*3/uL (ref 4.0–10.5)

## 2010-08-14 LAB — URINE CULTURE

## 2010-08-14 LAB — POCT I-STAT 3, ART BLOOD GAS (G3+)
Acid-base deficit: 3 mmol/L — ABNORMAL HIGH (ref 0.0–2.0)
Acid-base deficit: 4 mmol/L — ABNORMAL HIGH (ref 0.0–2.0)
Acid-base deficit: 4 mmol/L — ABNORMAL HIGH (ref 0.0–2.0)
Acid-base deficit: 5 mmol/L — ABNORMAL HIGH (ref 0.0–2.0)
Bicarbonate: 20.1 mEq/L (ref 20.0–24.0)
Bicarbonate: 20.3 mEq/L (ref 20.0–24.0)
Bicarbonate: 22.6 meq/L (ref 20.0–24.0)
Bicarbonate: 23.6 mEq/L (ref 20.0–24.0)
Bicarbonate: 23.9 mEq/L (ref 20.0–24.0)
O2 Saturation: 100 %
O2 Saturation: 100 %
O2 Saturation: 97 %
O2 Saturation: 98 %
Patient temperature: 34.8
Patient temperature: 37.4
TCO2: 21 mmol/L (ref 0–100)
TCO2: 21 mmol/L (ref 0–100)
TCO2: 24 mmol/L (ref 0–100)
TCO2: 25 mmol/L (ref 0–100)
TCO2: 25 mmol/L (ref 0–100)
pCO2 arterial: 38.4 mmHg (ref 35.0–45.0)
pCO2 arterial: 39 mmHg (ref 35.0–45.0)
pCO2 arterial: 41 mmHg (ref 35.0–45.0)
pCO2 arterial: 43.9 mmHg (ref 35.0–45.0)
pH, Arterial: 7.319 — ABNORMAL LOW (ref 7.350–7.400)
pH, Arterial: 7.39 (ref 7.350–7.400)
pO2, Arterial: 113 mmHg — ABNORMAL HIGH (ref 80.0–100.0)
pO2, Arterial: 256 mmHg — ABNORMAL HIGH (ref 80.0–100.0)
pO2, Arterial: 530 mmHg — ABNORMAL HIGH (ref 80.0–100.0)

## 2010-08-14 LAB — POCT I-STAT 4, (NA,K, GLUC, HGB,HCT)
Glucose, Bld: 142 mg/dL — ABNORMAL HIGH (ref 70–99)
Glucose, Bld: 149 mg/dL — ABNORMAL HIGH (ref 70–99)
Glucose, Bld: 183 mg/dL — ABNORMAL HIGH (ref 70–99)
Glucose, Bld: 209 mg/dL — ABNORMAL HIGH (ref 70–99)
Glucose, Bld: 218 mg/dL — ABNORMAL HIGH (ref 70–99)
Glucose, Bld: 98 mg/dL (ref 70–99)
HCT: 21 % — ABNORMAL LOW (ref 36.0–46.0)
HCT: 22 % — ABNORMAL LOW (ref 36.0–46.0)
HCT: 24 % — ABNORMAL LOW (ref 36.0–46.0)
HCT: 26 % — ABNORMAL LOW (ref 36.0–46.0)
Hemoglobin: 7.1 g/dL — ABNORMAL LOW (ref 12.0–15.0)
Hemoglobin: 7.5 g/dL — ABNORMAL LOW (ref 12.0–15.0)
Hemoglobin: 7.5 g/dL — ABNORMAL LOW (ref 12.0–15.0)
Hemoglobin: 7.8 g/dL — ABNORMAL LOW (ref 12.0–15.0)
Hemoglobin: 8.8 g/dL — ABNORMAL LOW (ref 12.0–15.0)
Potassium: 3.4 mEq/L — ABNORMAL LOW (ref 3.5–5.1)
Potassium: 3.5 meq/L (ref 3.5–5.1)
Potassium: 3.7 mEq/L (ref 3.5–5.1)
Potassium: 3.8 meq/L (ref 3.5–5.1)
Potassium: 4 mEq/L (ref 3.5–5.1)
Potassium: 4.4 mEq/L (ref 3.5–5.1)
Sodium: 139 mEq/L (ref 135–145)
Sodium: 141 meq/L (ref 135–145)
Sodium: 141 meq/L (ref 135–145)

## 2010-08-14 LAB — BRAIN NATRIURETIC PEPTIDE: Pro B Natriuretic peptide (BNP): 1100 pg/mL — ABNORMAL HIGH (ref 0.0–100.0)

## 2010-08-14 LAB — BLOOD GAS, ARTERIAL
Acid-base deficit: 3.2 mmol/L — ABNORMAL HIGH (ref 0.0–2.0)
Bicarbonate: 20.5 meq/L (ref 20.0–24.0)
Drawn by: 23588
FIO2: 0.21 %
O2 Saturation: 97.1 %
Patient temperature: 98.6
TCO2: 21.5 mmol/L (ref 0–100)
pCO2 arterial: 31.7 mmHg — ABNORMAL LOW (ref 35.0–45.0)
pH, Arterial: 7.427 — ABNORMAL HIGH (ref 7.350–7.400)
pO2, Arterial: 76.4 mmHg — ABNORMAL LOW (ref 80.0–100.0)

## 2010-08-14 LAB — URINE MICROSCOPIC-ADD ON

## 2010-08-14 LAB — POCT I-STAT 3, VENOUS BLOOD GAS (G3P V)
Acid-base deficit: 5 mmol/L — ABNORMAL HIGH (ref 0.0–2.0)
Bicarbonate: 20.9 mEq/L (ref 20.0–24.0)
TCO2: 22 mmol/L (ref 0–100)
pH, Ven: 7.32 — ABNORMAL HIGH (ref 7.250–7.300)

## 2010-08-14 LAB — CREATININE, SERUM
Creatinine, Ser: 0.94 mg/dL (ref 0.4–1.2)
GFR calc Af Amer: >60 mL/min (ref >60)

## 2010-08-14 LAB — HEMOGLOBIN AND HEMATOCRIT, BLOOD
HCT: 21.9 % — ABNORMAL LOW (ref 36.0–46.0)
Hemoglobin: 7.4 g/dL — ABNORMAL LOW (ref 12.0–15.0)

## 2010-08-14 LAB — MRSA PCR SCREENING: MRSA by PCR: NEGATIVE

## 2010-08-14 LAB — MAGNESIUM: Magnesium: 2.2 mg/dL (ref 1.5–2.5)

## 2010-08-14 LAB — HEMOGLOBIN A1C
Hgb A1c MFr Bld: 7.5 % — ABNORMAL HIGH (ref 4.6–6.1)
Mean Plasma Glucose: 169 mg/dL

## 2010-08-15 ENCOUNTER — Other Ambulatory Visit (HOSPITAL_COMMUNITY): Payer: Medicare Other

## 2010-08-15 ENCOUNTER — Encounter (HOSPITAL_COMMUNITY): Payer: Medicare Other | Attending: Oncology

## 2010-08-15 ENCOUNTER — Ambulatory Visit (HOSPITAL_COMMUNITY): Payer: Medicare Other | Admitting: Oncology

## 2010-08-15 DIAGNOSIS — C9110 Chronic lymphocytic leukemia of B-cell type not having achieved remission: Secondary | ICD-10-CM | POA: Insufficient documentation

## 2010-08-15 DIAGNOSIS — D649 Anemia, unspecified: Secondary | ICD-10-CM | POA: Insufficient documentation

## 2010-08-15 DIAGNOSIS — D638 Anemia in other chronic diseases classified elsewhere: Secondary | ICD-10-CM

## 2010-08-15 LAB — GLUCOSE, CAPILLARY: Glucose-Capillary: 144 mg/dL — ABNORMAL HIGH (ref 70–99)

## 2010-08-22 ENCOUNTER — Encounter (HOSPITAL_COMMUNITY): Payer: Medicare Other

## 2010-08-22 DIAGNOSIS — D638 Anemia in other chronic diseases classified elsewhere: Secondary | ICD-10-CM

## 2010-08-22 DIAGNOSIS — I10 Essential (primary) hypertension: Secondary | ICD-10-CM

## 2010-08-22 DIAGNOSIS — C9110 Chronic lymphocytic leukemia of B-cell type not having achieved remission: Secondary | ICD-10-CM

## 2010-08-22 DIAGNOSIS — E119 Type 2 diabetes mellitus without complications: Secondary | ICD-10-CM

## 2010-08-29 ENCOUNTER — Encounter (HOSPITAL_COMMUNITY): Payer: Medicare Other | Attending: Oncology

## 2010-08-29 DIAGNOSIS — C9110 Chronic lymphocytic leukemia of B-cell type not having achieved remission: Secondary | ICD-10-CM | POA: Insufficient documentation

## 2010-08-29 DIAGNOSIS — D638 Anemia in other chronic diseases classified elsewhere: Secondary | ICD-10-CM

## 2010-08-29 DIAGNOSIS — D649 Anemia, unspecified: Secondary | ICD-10-CM | POA: Insufficient documentation

## 2010-09-02 LAB — DIFFERENTIAL
Basophils Absolute: 0 10*3/uL (ref 0.0–0.1)
Eosinophils Relative: 1 % (ref 0–5)
Lymphs Abs: 7.8 10*3/uL — ABNORMAL HIGH (ref 0.7–4.0)
Monocytes Relative: 5 % (ref 3–12)
Neutrophils Relative %: 20 % — ABNORMAL LOW (ref 43–77)

## 2010-09-02 LAB — CBC
Platelets: 175 10*3/uL (ref 150–400)
RDW: 14.1 % (ref 11.5–15.5)
WBC: 10.5 10*3/uL (ref 4.0–10.5)

## 2010-09-05 ENCOUNTER — Encounter (HOSPITAL_COMMUNITY): Payer: Medicare Other | Attending: Oncology

## 2010-09-05 DIAGNOSIS — C9110 Chronic lymphocytic leukemia of B-cell type not having achieved remission: Secondary | ICD-10-CM

## 2010-09-05 DIAGNOSIS — D638 Anemia in other chronic diseases classified elsewhere: Secondary | ICD-10-CM

## 2010-09-12 ENCOUNTER — Encounter (HOSPITAL_COMMUNITY): Payer: Medicare Other

## 2010-09-12 DIAGNOSIS — D638 Anemia in other chronic diseases classified elsewhere: Secondary | ICD-10-CM

## 2010-09-12 DIAGNOSIS — C9110 Chronic lymphocytic leukemia of B-cell type not having achieved remission: Secondary | ICD-10-CM

## 2010-09-12 LAB — CBC
HCT: 30 % — ABNORMAL LOW (ref 36.0–46.0)
Hemoglobin: 10.1 g/dL — ABNORMAL LOW (ref 12.0–15.0)
MCHC: 33.7 g/dL (ref 30.0–36.0)
MCV: 87 fL (ref 78.0–100.0)
RBC: 3.45 MIL/uL — ABNORMAL LOW (ref 3.87–5.11)
WBC: 8.8 10*3/uL (ref 4.0–10.5)

## 2010-09-12 LAB — RETICULOCYTES: RBC.: 3.45 MIL/uL — ABNORMAL LOW (ref 3.87–5.11)

## 2010-09-12 LAB — DIFFERENTIAL
Basophils Absolute: 0.1 10*3/uL (ref 0.0–0.1)
Eosinophils Absolute: 0.1 10*3/uL (ref 0.0–0.7)
Lymphocytes Relative: 71 % — ABNORMAL HIGH (ref 12–46)
Neutro Abs: 2 10*3/uL (ref 1.7–7.7)

## 2010-09-12 LAB — COMPREHENSIVE METABOLIC PANEL
AST: 22 U/L (ref 0–37)
BUN: 16 mg/dL (ref 6–23)
CO2: 26 mEq/L (ref 19–32)
Calcium: 9.5 mg/dL (ref 8.4–10.5)
Chloride: 109 mEq/L (ref 96–112)
Creatinine, Ser: 0.97 mg/dL (ref 0.4–1.2)
GFR calc Af Amer: >60 mL/min (ref >60)
GFR calc non Af Amer: 56 mL/min — ABNORMAL LOW (ref >60)
Glucose, Bld: 138 mg/dL — ABNORMAL HIGH (ref 70–99)
Total Bilirubin: 0.3 mg/dL (ref 0.3–1.2)

## 2010-09-12 LAB — LACTATE DEHYDROGENASE: LDH: 160 U/L (ref 94–250)

## 2010-09-19 ENCOUNTER — Encounter (HOSPITAL_COMMUNITY): Payer: Medicare Other

## 2010-09-19 DIAGNOSIS — E119 Type 2 diabetes mellitus without complications: Secondary | ICD-10-CM

## 2010-09-19 DIAGNOSIS — I10 Essential (primary) hypertension: Secondary | ICD-10-CM

## 2010-09-19 DIAGNOSIS — D638 Anemia in other chronic diseases classified elsewhere: Secondary | ICD-10-CM

## 2010-09-19 DIAGNOSIS — C9110 Chronic lymphocytic leukemia of B-cell type not having achieved remission: Secondary | ICD-10-CM

## 2010-09-26 ENCOUNTER — Encounter (HOSPITAL_COMMUNITY): Payer: Medicare Other | Attending: Oncology

## 2010-09-26 DIAGNOSIS — C9110 Chronic lymphocytic leukemia of B-cell type not having achieved remission: Secondary | ICD-10-CM | POA: Insufficient documentation

## 2010-09-26 DIAGNOSIS — D649 Anemia, unspecified: Secondary | ICD-10-CM | POA: Insufficient documentation

## 2010-09-26 DIAGNOSIS — D638 Anemia in other chronic diseases classified elsewhere: Secondary | ICD-10-CM

## 2010-10-03 ENCOUNTER — Encounter (HOSPITAL_COMMUNITY): Payer: Medicare Other | Attending: Oncology

## 2010-10-03 DIAGNOSIS — D638 Anemia in other chronic diseases classified elsewhere: Secondary | ICD-10-CM

## 2010-10-03 DIAGNOSIS — C9110 Chronic lymphocytic leukemia of B-cell type not having achieved remission: Secondary | ICD-10-CM

## 2010-10-03 DIAGNOSIS — E119 Type 2 diabetes mellitus without complications: Secondary | ICD-10-CM

## 2010-10-10 ENCOUNTER — Encounter (HOSPITAL_COMMUNITY): Payer: Medicare Other

## 2010-10-10 ENCOUNTER — Other Ambulatory Visit (HOSPITAL_COMMUNITY): Payer: Self-pay | Admitting: Oncology

## 2010-10-10 DIAGNOSIS — C9110 Chronic lymphocytic leukemia of B-cell type not having achieved remission: Secondary | ICD-10-CM

## 2010-10-10 DIAGNOSIS — E119 Type 2 diabetes mellitus without complications: Secondary | ICD-10-CM

## 2010-10-10 DIAGNOSIS — D638 Anemia in other chronic diseases classified elsewhere: Secondary | ICD-10-CM

## 2010-10-10 LAB — CBC
HCT: 40.9 % (ref 36.0–46.0)
Hemoglobin: 12.1 g/dL (ref 12.0–15.0)
MCH: 25.1 pg — ABNORMAL LOW (ref 26.0–34.0)
MCHC: 29.6 g/dL — ABNORMAL LOW (ref 30.0–36.0)
RDW: 15.7 % — ABNORMAL HIGH (ref 11.5–15.5)

## 2010-10-10 NOTE — Assessment & Plan Note (Signed)
OFFICE VISIT   Anne [name]  DOB:  [birth year]                                        July 11, 2009  CHART #:  [id number]   HISTORY:  The patient is [name] 77 year old white female status post coronary  artery bypass graft x5 on June 16, 2009, by Dr. Dorris Fetch for  severe three-vessel coronary artery disease.  Postoperatively, she did  well and she did have [name] postoperative atrial fibrillation.  She has  multiple medical problems including diabetes mellitus type 2,  hypertension, dyslipidemia, history of chronic lymphocytic leukemia, and  early dementia.  Currently, she reports that she is using no pain  medication.  She has improved her ambulation and was walking in 12-  minute increments without too much difficulty.  She does have occasional  back pain.  She denies shortness of breath or angina symptoms.  She  denies palpitations.  She denies fevers, chills, or other constitutional  symptoms.   DIAGNOSTIC TESTS:  Chest x-ray was obtained on today's date.  It reveals  [name] small left-sided effusion and some elevation of the left  hemidiaphragm, but no evidence of congestive failure or significant  infiltrates.   PHYSICAL EXAMINATION:  Vital Signs:  Blood pressure 134/66, pulse is 85  and regular, respirations 18 and unlabored, oxygen saturation is 96% on  room air.  General:  This is an elderly white female, no acute distress.  Chest:  Incisions are inspected, healing well without evidence of  infection.  Extremities:  Some minor edema.  Cardiac:  Regular rate and  rhythm.  No murmurs, gallops, or rubs.  Normal S1 and S2.  Pulmonary:  Slightly diminished breath sounds in the left base, otherwise clear.   ASSESSMENT:  The patient is making excellent ongoing recovery following  her surgical revascularization.  We have encouraged to increase her  activities including ambulation as tolerated.  She can return to  driving, and we have given her  instructions on advancing slowly in this  regard.  She continues to have some lifting restrictions which she is  aware of.  She will  continue to follow up with her cardiologist and primary physician.  We  will see her again on [name] p.r.n. basis.   Rowe Clack, P.[name].-C.   Sherryll Burger  D:  07/11/2009  T:  07/12/2009  Job:  [id number]   cc:   Sheliah Mends, MD  Catalina Pizza, M.D.

## 2010-10-17 ENCOUNTER — Ambulatory Visit (HOSPITAL_COMMUNITY): Payer: Medicare Other | Admitting: Oncology

## 2010-10-17 ENCOUNTER — Encounter (HOSPITAL_COMMUNITY): Payer: Medicare Other

## 2010-10-24 ENCOUNTER — Encounter (HOSPITAL_COMMUNITY): Payer: Medicare Other

## 2010-10-24 ENCOUNTER — Other Ambulatory Visit (HOSPITAL_COMMUNITY): Payer: Self-pay | Admitting: Oncology

## 2010-10-24 DIAGNOSIS — C9110 Chronic lymphocytic leukemia of B-cell type not having achieved remission: Secondary | ICD-10-CM

## 2010-10-24 LAB — CBC
MCH: 24.3 pg — ABNORMAL LOW (ref 26.0–34.0)
MCV: 81.6 fL (ref 78.0–100.0)
Platelets: 220 10*3/uL (ref 150–400)
RDW: 16 % — ABNORMAL HIGH (ref 11.5–15.5)
WBC: 14.4 10*3/uL — ABNORMAL HIGH (ref 4.0–10.5)

## 2010-10-31 ENCOUNTER — Encounter (HOSPITAL_COMMUNITY): Payer: Medicare Other

## 2010-11-07 ENCOUNTER — Encounter (HOSPITAL_COMMUNITY): Payer: Medicare Other | Attending: Oncology

## 2010-11-07 ENCOUNTER — Other Ambulatory Visit (HOSPITAL_COMMUNITY): Payer: Self-pay | Admitting: Oncology

## 2010-11-07 DIAGNOSIS — D649 Anemia, unspecified: Secondary | ICD-10-CM | POA: Insufficient documentation

## 2010-11-07 DIAGNOSIS — C9110 Chronic lymphocytic leukemia of B-cell type not having achieved remission: Secondary | ICD-10-CM | POA: Insufficient documentation

## 2010-11-07 LAB — DIFFERENTIAL
Basophils Absolute: 0 10*3/uL (ref 0.0–0.1)
Eosinophils Absolute: 0.1 10*3/uL (ref 0.0–0.7)
Lymphocytes Relative: 80 % — ABNORMAL HIGH (ref 12–46)
Neutrophils Relative %: 17 % — ABNORMAL LOW (ref 43–77)

## 2010-11-07 LAB — CBC
MCHC: 30.6 g/dL (ref 30.0–36.0)
RDW: 17.3 % — ABNORMAL HIGH (ref 11.5–15.5)

## 2010-11-14 ENCOUNTER — Ambulatory Visit (HOSPITAL_COMMUNITY): Payer: Medicare Other | Admitting: Oncology

## 2010-11-14 ENCOUNTER — Encounter (HOSPITAL_COMMUNITY): Payer: Medicare Other | Admitting: Oncology

## 2010-11-14 DIAGNOSIS — C9110 Chronic lymphocytic leukemia of B-cell type not having achieved remission: Secondary | ICD-10-CM

## 2010-11-14 DIAGNOSIS — D638 Anemia in other chronic diseases classified elsewhere: Secondary | ICD-10-CM

## 2011-01-09 ENCOUNTER — Encounter (HOSPITAL_COMMUNITY): Payer: Medicare Other | Attending: Oncology

## 2011-01-09 ENCOUNTER — Other Ambulatory Visit (HOSPITAL_COMMUNITY): Payer: Self-pay | Admitting: Oncology

## 2011-01-09 ENCOUNTER — Encounter (HOSPITAL_BASED_OUTPATIENT_CLINIC_OR_DEPARTMENT_OTHER): Payer: Medicare Other | Admitting: Oncology

## 2011-01-09 DIAGNOSIS — I10 Essential (primary) hypertension: Secondary | ICD-10-CM | POA: Insufficient documentation

## 2011-01-09 DIAGNOSIS — D638 Anemia in other chronic diseases classified elsewhere: Secondary | ICD-10-CM

## 2011-01-09 DIAGNOSIS — E039 Hypothyroidism, unspecified: Secondary | ICD-10-CM | POA: Insufficient documentation

## 2011-01-09 DIAGNOSIS — E785 Hyperlipidemia, unspecified: Secondary | ICD-10-CM

## 2011-01-09 DIAGNOSIS — IMO0001 Reserved for inherently not codable concepts without codable children: Secondary | ICD-10-CM | POA: Insufficient documentation

## 2011-01-09 DIAGNOSIS — C9110 Chronic lymphocytic leukemia of B-cell type not having achieved remission: Secondary | ICD-10-CM

## 2011-01-09 LAB — CBC
Platelets: 217 10*3/uL (ref 150–400)
RDW: 23.4 % — ABNORMAL HIGH (ref 11.5–15.5)
WBC: 16.1 10*3/uL — ABNORMAL HIGH (ref 4.0–10.5)

## 2011-01-09 LAB — DIFFERENTIAL
Blasts: 0 %
Lymphocytes Relative: 73 % — ABNORMAL HIGH (ref 12–46)
Metamyelocytes Relative: 0 %
Monocytes Absolute: 0.2 10*3/uL (ref 0.1–1.0)
Monocytes Relative: 1 % — ABNORMAL LOW (ref 3–12)
nRBC: 0 /100 WBC

## 2011-01-09 MED ORDER — EPOETIN ALFA 40000 UNIT/ML IJ SOLN
INTRAMUSCULAR | Status: AC
  Filled 2011-01-09: qty 1

## 2011-01-09 MED ORDER — EPOETIN ALFA 40000 UNIT/ML IJ SOLN
40000.0000 [IU] | Freq: Once | INTRAMUSCULAR | Status: AC
  Administered 2011-01-09: 40000 [IU] via SUBCUTANEOUS

## 2011-01-09 NOTE — Progress Notes (Signed)
Anne Melena, MD [address] [place] [address]  1. LEUKEMIA, LYMPHOCYTIC, CHRONIC  CBC, Differential, Basic metabolic panel  2. Anemia of chronic disease    3. DIABETES MELLITUS, TYPE II, UNCONTROLLED    4. HYPERTENSION      CURRENT THERAPY: No therapy for CLL presently.  Require Procrit 40,000 U every other day as needed.  INTERVAL HISTORY: Anne [name] 77 y.o. female returns for  regular  visit for followup of Stage 0 CLL and Anemia of chronic disease.  The patient reports that she felt much better when her Hgb was higher, however, she voices some concerns about the cost of the Procrit injections she used to receive.  She reports that she is caring for her ailing husband at home.  Fortunately, she has help with an aid.  She admits to mild fatigue but denies any other complaints.  No past medical history on file.  has LEUKEMIA, LYMPHOCYTIC, CHRONIC; HYPOTHYROIDISM; DIABETES MELLITUS, TYPE II, UNCONTROLLED; HYPERLIPIDEMIA; HYPERKALEMIA; Anemia of chronic disease; DEPRESSION; CATARACTS; HYPERTENSION; BRONCHITIS, ACUTE; ARTHRITIS; LOW BACK PAIN, CHRONIC; BURSITIS, ACROMIOCLAVICULAR, LEFT; MEMORY LOSS; FECAL INCONTINENCE; and LACERATION, HAND, LEFT on her problem list.     is allergic to lisinopril.  Ms. Sandles does not currently have medications on file.  No past surgical history on file.  Denies any headaches, dizziness, double vision, fevers, chills, night sweats, nausea, vomiting, diarrhea, constipation, chest pain, heart palpitations, shortness of breath, blood in stool, black tarry stool, urinary pain, urinary burning, urinary frequency, hematuria.   PHYSICAL EXAMINATION  ECOG PERFORMANCE STATUS: 0 - Asymptomatic  Filed Vitals:   01/09/11 0950  BP: 126/64  Pulse: 103  Temp: 98.5 F (36.9 C)    GENERAL:alert, no distress, well nourished, well developed, comfortable, cooperative and smiling SKIN: skin color, texture, turgor are normal, no rashes or significant  lesions HEAD: Normocephalic, No masses, lesions, tenderness or abnormalities EYES: normal EARS: External ears normal OROPHARYNX:mucous membranes are moist  NECK: supple, no adenopathy, no bruits, no JVD, no stridor, non-tender, trachea midline LYMPH:  no palpable lymphadenopathy, no hepatosplenomegaly BREAST:not examined LUNGS: clear to auscultation and percussion HEART: regular rate & rhythm, no murmurs, no gallops, S1 normal and S2 normal ABDOMEN:abdomen soft, non-tender, normal bowel sounds and no hepatosplenomegaly BACK: Back symmetric, no curvature., No CVA tenderness EXTREMITIES:less then 2 second capillary refill, no joint deformities, effusion, or inflammation, no edema, no skin discoloration, no clubbing, no cyanosis  NEURO: alert & oriented x 3 with fluent speech, no focal motor/sensory deficits, gait normal  LABORATORY DATA: CBC    Component Value Date/Time   WBC 16.1* 01/09/2011 0917   RBC 3.61* 01/09/2011 0917   HGB 9.5* 01/09/2011 0917   HCT 29.5* 01/09/2011 0917   PLT 217 01/09/2011 0917   MCV 81.7 01/09/2011 0917   MCH 26.3 01/09/2011 0917   MCHC 32.2 01/09/2011 0917   RDW 23.4* 01/09/2011 0917   LYMPHSABS 11.7* 01/09/2011 0917   MONOABS 0.2 01/09/2011 0917   EOSABS 0.2 01/09/2011 0917   BASOSABS 0.0 01/09/2011 0917    ASSESSMENT:  1. Stage 0 CLL, not requiring any therapy 2. Anemia of chronic disease with a quick response to Procrit 40,000 units.  This was held because her hemoglobin responded nicely and was 12 or higher.  She is now 9.5 and we will therefore re-initiate Procrit 60454 units every other week. 3. DM, uncontrolled 4. HTN  PLAN:  1. Procrit 40,000 units today and then every other week. 2. Lab work: CBC diff and BMET  in 2 months 3. Return in 2 months for follow-up.   All questions were answered. The patient knows to call the clinic with any problems, questions or concerns. We can certainly see the patient much sooner if necessary.  The patient and plan  discussed with Glenford Peers, MD and he is in agreement with the aforementioned.  I spent 25 minutes counseling the patient face to face. The total time spent in the appointment was 40 minutes.  KEFALAS,THOMAS

## 2011-01-09 NOTE — Patient Instructions (Addendum)
[facility]  Discharge Instructions  RECOMMENDATIONS MADE BY THE CONSULTANT AND ANY TEST RESULTS WILL BE SENT TO YOUR REFERRING DOCTOR.   EXAM FINDINGS BY MD TODAY AND SIGNS AND SYMPTOMS TO REPORT TO CLINIC OR PRIMARY MD: Your Hgb has dropped to 9.5  MEDICATIONS PRESCRIBED: restart procrit every 2 weeks      SPECIAL INSTRUCTIONS/FOLLOW-UP: Appts. As given   I acknowledge that I have been informed and understand all the instructions given to me and received a copy. I do not have any more questions at this time, but understand that I may call the Specialty Clinic at [facility] at [phone] during business hours should I have any further questions or need assistance in obtaining follow-up care.    __________________________________________  _____________  __________ Signature of Patient or Authorized Representative            Date                   Time    __________________________________________ Nurse's Signature

## 2011-01-09 NOTE — Progress Notes (Signed)
Labs drawn today for cbc/diff 

## 2011-01-12 ENCOUNTER — Other Ambulatory Visit (HOSPITAL_COMMUNITY): Payer: Self-pay | Admitting: Internal Medicine

## 2011-01-12 ENCOUNTER — Ambulatory Visit (HOSPITAL_COMMUNITY)
Admission: RE | Admit: 2011-01-12 | Discharge: 2011-01-12 | Disposition: A | Discharge status: Home / Self Care | Payer: Medicare Other | Source: Ambulatory Visit | Attending: Internal Medicine | Admitting: Internal Medicine

## 2011-01-12 DIAGNOSIS — Z01811 Encounter for preprocedural respiratory examination: Secondary | ICD-10-CM

## 2011-01-12 DIAGNOSIS — Z01818 Encounter for other preprocedural examination: Secondary | ICD-10-CM | POA: Insufficient documentation

## 2011-01-12 DIAGNOSIS — Z951 Presence of aortocoronary bypass graft: Secondary | ICD-10-CM | POA: Insufficient documentation

## 2011-01-18 ENCOUNTER — Ambulatory Visit (HOSPITAL_COMMUNITY)
Admission: RE | Admit: 2011-01-18 | Discharge: 2011-01-18 | Disposition: A | Discharge status: Home / Self Care | Payer: Medicare Other | Source: Ambulatory Visit | Attending: Internal Medicine | Admitting: Internal Medicine

## 2011-01-18 DIAGNOSIS — R0602 Shortness of breath: Secondary | ICD-10-CM | POA: Insufficient documentation

## 2011-01-18 DIAGNOSIS — Z951 Presence of aortocoronary bypass graft: Secondary | ICD-10-CM | POA: Insufficient documentation

## 2011-01-18 DIAGNOSIS — I2510 Atherosclerotic heart disease of native coronary artery without angina pectoris: Secondary | ICD-10-CM | POA: Insufficient documentation

## 2011-01-18 HISTORY — PX: CARDIAC CATHETERIZATION: SHX172

## 2011-01-18 LAB — GLUCOSE, CAPILLARY
Glucose-Capillary: 103 mg/dL — ABNORMAL HIGH (ref 70–99)
Glucose-Capillary: 82 mg/dL (ref 70–99)

## 2011-01-18 LAB — POCT I-STAT 3, ART BLOOD GAS (G3+)
Bicarbonate: 22.5 mEq/L (ref 20.0–24.0)
TCO2: 24 mmol/L (ref 0–100)

## 2011-01-18 LAB — POCT I-STAT 3, VENOUS BLOOD GAS (G3P V)
Bicarbonate: 21.1 mEq/L (ref 20.0–24.0)
O2 Saturation: 66 %
TCO2: 22 mmol/L (ref 0–100)
pCO2, Ven: 38.6 mmHg — ABNORMAL LOW (ref 45.0–50.0)
pH, Ven: 7.345 — ABNORMAL HIGH (ref 7.250–7.300)

## 2011-01-19 NOTE — Cardiovascular Report (Signed)
NAMETALLYN, HOLROYD                 ACCOUNT NO.:  [id number]  MEDICAL RECORD NO.:  [id number]  LOCATION:  [facility]                         FACILITY:  [facility]  PHYSICIAN:  [place] Concha Sudol, MD         DATE OF BIRTH:  May 03, 1928  DATE OF PROCEDURE:  01/18/2011 DATE OF DISCHARGE:                           CARDIAC CATHETERIZATION   OPERATOR:  [place] Jazzlin Clements, MD  INDICATION:  Newly-reduced ejection fraction of the setting of coronary artery disease with history of cardiac bypass.  HISTORY OF PRESENT ILLNESS:  Anne [name] is an 77-year-old female with a history of coronary artery disease status post bypass surgery in January 2012.  At that time, the EF was approximately 40% to 45% and she had been doing well until recently.  She has had increasing shortness of breath most notably with exertion.  A repeat echocardiogram demonstrated an EF of approximately 25% to 30% with moderate intraventricular dyssynchrony and elevated right ventricular systolic pressure of 30-40 mmHg.  Given the sudden change in ejection fraction and increasing shortness of breath with exertion, she was referred for right and left heart catheterization.  PROCEDURE:  After informed consent was obtained, the patient was brought to the cardiac catheterization lab, sterilely prepped and draped in the usual fashion.  After procedural and radiation safety time-out, the area around the right femoral artery and vein were identified and anesthetized with 10 mL of 1% lidocaine.  The right femoral artery was first accessed with a straight wire and needle and a right femoral 5- French femoral access catheter was replaced.  Then the right femoral vein was accessed with a straight needle and wire and a 7-French venous sheath was placed.  Subsequently, the patient underwent right and left heart catheterization with a Swan-Ganz catheter and 5-French pigtail catheter, JL-4 catheter, JL-5 catheter, and AR-1 catheter.  ESTIMATED BLOOD LOSS:  Less than  10 mL.  COMPLICATIONS:  There were no acute complications.  FINDINGS:  Native: 1. LAD - 70% to 80% midvessel stenosis with competitive flow noted     distally.  There was an 80% proximal D1 lesion bypassed with a     sequential graft. 2. Left circumflex - mid left circumflex demonstrate 80% to 90%     stenosis at the first OM1 bifurcation. 3. Right coronary artery.  There was ostial 50% stenosis and mid 70%     RCA lesion as well as a distal 90% stenosis, which was noted to be     bypassed distally with competitive flow in the PDA. 4. LVEDP = 11 mmHg.  Graft: 1. LIMA to LAD - patent with good distal runoff. 2. SVG to D1/OM1 - sequential graft noted to be patent with good     distal runoff. 3. SVG to PDA - patent with a sharp inferior takeoff engaged only with     an AR-1 catheter.  This demonstrated good distal runoff with 2     large valves noted in the vein graft.  Right heart catheterization: 1. RA - 2. 2. RV - 24/6. 3. PCWP - 8. 4. PA - 26/9 (16). 5. Fick cardiac output/cardiac index - 5.75/3.36. 6. Thermodilution cardiac output/cardiac index -  4.04/2.36. 7. PA sat percent - 66%. 8. AO sat percent - 96%  IMPRESSION: 1. Mildly reduced cardiac output with no signs of decompensated heart     failure and normal right heart filling pressures. 2. Patent bypass grafts with underlying three-vessel coronary artery     disease.  PLAN:  Ms. Shidler apparently has had a reduction in ejection fraction which may be due to intraventricular dyssynchrony.  There are no new acute coronary stenosis or signs of ischemia that could be improved to improve her ejection fraction. I will likely decrease her lasix due to  low RA pressure. I feel she will also tolerate a mild increase in her B-blocker. We will plan to continue medical therapy with a repeat  echocardiogram in approximately 3-6 months to again reassess her  ejection fraction.  If it remains low, she may be a candidate for a BiV    AICD or if she is not interested in a fibrillator perhaps she may improve  symptomatically from a BiV pacemaker.     [place] Geralyn Figiel, MD     CH/MEDQ  D:  01/18/2011  T:  01/18/2011  Job:  [id number]  cc:   Catalina Pizza, M.D.  Electronically Signed by Kirtland Bouchard. Osmond Steckman M.D. on 01/19/2011 10:53:16 AM

## 2011-01-24 ENCOUNTER — Encounter (HOSPITAL_BASED_OUTPATIENT_CLINIC_OR_DEPARTMENT_OTHER): Payer: Medicare Other

## 2011-01-24 DIAGNOSIS — D638 Anemia in other chronic diseases classified elsewhere: Secondary | ICD-10-CM

## 2011-01-24 DIAGNOSIS — C9110 Chronic lymphocytic leukemia of B-cell type not having achieved remission: Secondary | ICD-10-CM

## 2011-01-24 MED ORDER — EPOETIN ALFA 40000 UNIT/ML IJ SOLN
INTRAMUSCULAR | Status: AC
  Administered 2011-01-24: 40000 [IU] via SUBCUTANEOUS
  Filled 2011-01-24: qty 1

## 2011-01-24 NOTE — Progress Notes (Signed)
Anne [name] presents today for injection per MD orders. Procrit 40,000 administered SQ in right Abdomen. Administration without incident. Patient tolerated well.  

## 2011-02-06 ENCOUNTER — Telehealth (HOSPITAL_COMMUNITY): Payer: Self-pay

## 2011-02-06 ENCOUNTER — Encounter (HOSPITAL_BASED_OUTPATIENT_CLINIC_OR_DEPARTMENT_OTHER): Payer: Medicare Other | Admitting: Oncology

## 2011-02-06 ENCOUNTER — Encounter (HOSPITAL_COMMUNITY): Payer: Medicare Other | Attending: Oncology

## 2011-02-06 ENCOUNTER — Encounter (HOSPITAL_BASED_OUTPATIENT_CLINIC_OR_DEPARTMENT_OTHER): Payer: Medicare Other

## 2011-02-06 ENCOUNTER — Other Ambulatory Visit (HOSPITAL_COMMUNITY): Payer: Self-pay | Admitting: Oncology

## 2011-02-06 DIAGNOSIS — R058 Other specified cough: Secondary | ICD-10-CM

## 2011-02-06 DIAGNOSIS — R05 Cough: Secondary | ICD-10-CM

## 2011-02-06 DIAGNOSIS — E785 Hyperlipidemia, unspecified: Secondary | ICD-10-CM | POA: Insufficient documentation

## 2011-02-06 DIAGNOSIS — IMO0001 Reserved for inherently not codable concepts without codable children: Secondary | ICD-10-CM | POA: Insufficient documentation

## 2011-02-06 DIAGNOSIS — C9110 Chronic lymphocytic leukemia of B-cell type not having achieved remission: Secondary | ICD-10-CM

## 2011-02-06 DIAGNOSIS — E875 Hyperkalemia: Secondary | ICD-10-CM

## 2011-02-06 DIAGNOSIS — M2550 Pain in unspecified joint: Secondary | ICD-10-CM | POA: Insufficient documentation

## 2011-02-06 DIAGNOSIS — J3489 Other specified disorders of nose and nasal sinuses: Secondary | ICD-10-CM

## 2011-02-06 DIAGNOSIS — R059 Cough, unspecified: Secondary | ICD-10-CM

## 2011-02-06 DIAGNOSIS — J069 Acute upper respiratory infection, unspecified: Secondary | ICD-10-CM

## 2011-02-06 DIAGNOSIS — E039 Hypothyroidism, unspecified: Secondary | ICD-10-CM | POA: Insufficient documentation

## 2011-02-06 DIAGNOSIS — I10 Essential (primary) hypertension: Secondary | ICD-10-CM | POA: Insufficient documentation

## 2011-02-06 DIAGNOSIS — D638 Anemia in other chronic diseases classified elsewhere: Secondary | ICD-10-CM

## 2011-02-06 LAB — BASIC METABOLIC PANEL
CO2: 24 mEq/L (ref 19–32)
Calcium: 9.7 mg/dL (ref 8.4–10.5)
Creatinine, Ser: 1.43 mg/dL — ABNORMAL HIGH (ref 0.50–1.10)
GFR calc non Af Amer: 35 mL/min — ABNORMAL LOW (ref >60)
Sodium: 133 mEq/L — ABNORMAL LOW (ref 135–145)

## 2011-02-06 LAB — CBC
HCT: 31.1 % — ABNORMAL LOW (ref 36.0–46.0)
MCHC: 31.5 g/dL (ref 30.0–36.0)
MCV: 91.7 fL (ref 78.0–100.0)
Platelets: 353 10*3/uL (ref 150–400)
RDW: 19.3 % — ABNORMAL HIGH (ref 11.5–15.5)
WBC: 21.6 10*3/uL — ABNORMAL HIGH (ref 4.0–10.5)

## 2011-02-06 LAB — C-REACTIVE PROTEIN: CRP: 15.63 mg/dL — ABNORMAL HIGH (ref <0.60)

## 2011-02-06 LAB — DIFFERENTIAL
Basophils Absolute: 0 10*3/uL (ref 0.0–0.1)
Basophils Relative: 0 % (ref 0–1)
Eosinophils Absolute: 0 10*3/uL (ref 0.0–0.7)
Eosinophils Relative: 0 % (ref 0–5)
Neutrophils Relative %: 43 % (ref 43–77)

## 2011-02-06 LAB — SEDIMENTATION RATE: Sed Rate: 85 mm/hr — ABNORMAL HIGH (ref 0–22)

## 2011-02-06 MED ORDER — EPOETIN ALFA 40000 UNIT/ML IJ SOLN
INTRAMUSCULAR | Status: AC
  Filled 2011-02-06: qty 1

## 2011-02-06 MED ORDER — EPOETIN ALFA 40000 UNIT/ML IJ SOLN
40000.0000 [IU] | Freq: Once | INTRAMUSCULAR | Status: AC
  Administered 2011-02-06: 40000 [IU] via SUBCUTANEOUS

## 2011-02-06 NOTE — Progress Notes (Signed)
Anne [name] presents today for injection per MD orders. Procrit 40,000 units administered SQ in left Abdomen. Administration without incident. Patient tolerated well.  

## 2011-02-06 NOTE — Telephone Encounter (Signed)
Daughter instructed to stop potassium and to increase fluid intake.

## 2011-02-06 NOTE — Progress Notes (Signed)
S: The patient reports that she has been ill for 3 weeks.  She reports a cough productive of yellowish-green sputum, sore throat, and post-nasal drip.  The patient reports that her husband was sick recently and he was treated in the emergency room with antibiotics.    She has been taking over the counter cough syrup, but she has not been getting better.  O: General: Seen in a chemotherapy chair.  Ill looking.   HEENT: Posterior pharynx erythema and tender palpation of the neck. Cardiac: RRR Lungs: CTA B/L  Assessment: 1. URI 2. Cough productive of yellowish green sputum 3. Sore throat  Plan: 1. Augmentin 875/125 PO q12 hours x 7 days 2. I initially prescribed a Z-Pak, but the pharmacist fortunately called me to let me know that there is QT elongation with Azithromycin and Celexa.  Therefore Augmentin was prescribed instead.

## 2011-02-06 NOTE — Progress Notes (Signed)
Labs drawn today for cbc/diff,bmp,esr,c-reactive protein

## 2011-02-16 LAB — PROTEIN ELECTROPHORESIS, SERUM
Alpha-1-Globulin: 5 — ABNORMAL HIGH
Alpha-2-Globulin: 15.5 — ABNORMAL HIGH
Beta Globulin: 6
Total Protein ELP: 6.9

## 2011-02-16 LAB — DIFFERENTIAL
Basophils Absolute: 0
Eosinophils Absolute: 0.1
Lymphocytes Relative: 71 — ABNORMAL HIGH
Lymphs Abs: 7.8 — ABNORMAL HIGH
Neutro Abs: 2.7

## 2011-02-16 LAB — CBC
Hemoglobin: 9.5 — ABNORMAL LOW
MCHC: 34.4
Platelets: 235
RDW: 14

## 2011-02-16 LAB — IMMUNOFIXATION ELECTROPHORESIS: IgG (Immunoglobin G), Serum: 676 — ABNORMAL LOW

## 2011-02-20 ENCOUNTER — Encounter (HOSPITAL_BASED_OUTPATIENT_CLINIC_OR_DEPARTMENT_OTHER): Payer: Medicare Other

## 2011-02-20 DIAGNOSIS — E785 Hyperlipidemia, unspecified: Secondary | ICD-10-CM

## 2011-02-20 DIAGNOSIS — C9110 Chronic lymphocytic leukemia of B-cell type not having achieved remission: Secondary | ICD-10-CM

## 2011-02-20 DIAGNOSIS — D638 Anemia in other chronic diseases classified elsewhere: Secondary | ICD-10-CM

## 2011-02-20 DIAGNOSIS — I10 Essential (primary) hypertension: Secondary | ICD-10-CM

## 2011-02-20 DIAGNOSIS — E039 Hypothyroidism, unspecified: Secondary | ICD-10-CM

## 2011-02-20 DIAGNOSIS — IMO0001 Reserved for inherently not codable concepts without codable children: Secondary | ICD-10-CM

## 2011-02-20 DIAGNOSIS — E875 Hyperkalemia: Secondary | ICD-10-CM

## 2011-02-20 LAB — CBC
Hemoglobin: 10.4 g/dL — ABNORMAL LOW (ref 12.0–15.0)
MCH: 29.8 pg (ref 26.0–34.0)
MCHC: 31.6 g/dL (ref 30.0–36.0)
MCV: 94.3 fL (ref 78.0–100.0)
RBC: 3.49 MIL/uL — ABNORMAL LOW (ref 3.87–5.11)

## 2011-02-20 MED ORDER — EPOETIN ALFA 40000 UNIT/ML IJ SOLN
INTRAMUSCULAR | Status: AC
  Administered 2011-02-20: 40000 [IU] via SUBCUTANEOUS
  Filled 2011-02-20: qty 1

## 2011-02-20 MED ORDER — EPOETIN ALFA 40000 UNIT/ML IJ SOLN
40000.0000 [IU] | Freq: Once | INTRAMUSCULAR | Status: AC
  Administered 2011-02-20: 40000 [IU] via SUBCUTANEOUS

## 2011-02-21 ENCOUNTER — Other Ambulatory Visit (HOSPITAL_COMMUNITY): Payer: Self-pay | Admitting: Oncology

## 2011-02-21 ENCOUNTER — Telehealth (HOSPITAL_COMMUNITY): Payer: Self-pay

## 2011-02-21 NOTE — Telephone Encounter (Signed)
Wanting to know of Potassium was checked when Mrs. Anne [name] came in on 9/24 for procrit and whether or not to restart Potassium.  Discussed with Anne Seashore, PA - BMET not done on 9/24 but is scheduled to be done on 10/10 with other labs and injection. To continue to hold Potassium until then. Information relayed to Ms. Elige Radon.

## 2011-02-23 LAB — DIFFERENTIAL
Basophils Absolute: 0
Basophils Relative: 1
Eosinophils Absolute: 0.1
Eosinophils Relative: 1

## 2011-02-23 LAB — IRON AND TIBC
Iron: 55
Saturation Ratios: 19 — ABNORMAL LOW
TIBC: 291
UIBC: 236

## 2011-02-23 LAB — CBC
HCT: 28.8 — ABNORMAL LOW
Hemoglobin: 9.6 — ABNORMAL LOW
MCHC: 33.3
MCV: 86.7
Platelets: 215
RBC: 3.32 — ABNORMAL LOW
RDW: 14.2
WBC: 8.6

## 2011-03-02 LAB — DIFFERENTIAL
Basophils Relative: 0
Eosinophils Absolute: 0.1 — ABNORMAL LOW
Monocytes Absolute: 0.5
Neutro Abs: 2.9
Neutrophils Relative %: 22 — ABNORMAL LOW

## 2011-03-02 LAB — CBC
MCHC: 33.4
MCV: 84.9
RBC: 3.3 — ABNORMAL LOW

## 2011-03-07 ENCOUNTER — Encounter (HOSPITAL_COMMUNITY): Payer: Medicare Other | Attending: Oncology

## 2011-03-07 DIAGNOSIS — D638 Anemia in other chronic diseases classified elsewhere: Secondary | ICD-10-CM | POA: Insufficient documentation

## 2011-03-07 DIAGNOSIS — C9110 Chronic lymphocytic leukemia of B-cell type not having achieved remission: Secondary | ICD-10-CM

## 2011-03-07 DIAGNOSIS — E875 Hyperkalemia: Secondary | ICD-10-CM | POA: Insufficient documentation

## 2011-03-07 LAB — CBC
MCH: 29.3 pg (ref 26.0–34.0)
MCHC: 31.1 g/dL (ref 30.0–36.0)
Platelets: 266 10*3/uL (ref 150–400)
RBC: 3.69 MIL/uL — ABNORMAL LOW (ref 3.87–5.11)

## 2011-03-07 LAB — DIFFERENTIAL
Basophils Absolute: 0 10*3/uL (ref 0.0–0.1)
Basophils Relative: 0 % (ref 0–1)
Eosinophils Absolute: 0.1 10*3/uL (ref 0.0–0.7)
Neutrophils Relative %: 31 % — ABNORMAL LOW (ref 43–77)

## 2011-03-07 LAB — BASIC METABOLIC PANEL
Calcium: 9.3 mg/dL (ref 8.4–10.5)
GFR calc non Af Amer: 46 mL/min — ABNORMAL LOW (ref >90)
Sodium: 136 mEq/L (ref 135–145)

## 2011-03-07 MED ORDER — EPOETIN ALFA 40000 UNIT/ML IJ SOLN
INTRAMUSCULAR | Status: AC
  Administered 2011-03-07: 40000 [IU] via SUBCUTANEOUS
  Filled 2011-03-07: qty 1

## 2011-03-07 MED ORDER — EPOETIN ALFA 20000 UNIT/ML IJ SOLN: 40000.0000 [IU] | Freq: Once | INTRAMUSCULAR | Status: DC

## 2011-03-07 NOTE — Progress Notes (Signed)
Specimen from left ac for labs. Procrit 96045 units given subcutaneously to right lower abd.

## 2011-03-08 ENCOUNTER — Other Ambulatory Visit (HOSPITAL_COMMUNITY): Payer: Medicare Other

## 2011-03-13 ENCOUNTER — Ambulatory Visit (HOSPITAL_COMMUNITY): Payer: Medicare Other | Admitting: Oncology

## 2011-03-16 ENCOUNTER — Ambulatory Visit (HOSPITAL_COMMUNITY): Payer: Medicare Other | Admitting: Oncology

## 2011-03-22 ENCOUNTER — Encounter (HOSPITAL_COMMUNITY): Payer: Self-pay | Admitting: Oncology

## 2011-03-22 ENCOUNTER — Encounter (HOSPITAL_BASED_OUTPATIENT_CLINIC_OR_DEPARTMENT_OTHER): Payer: Medicare Other

## 2011-03-22 ENCOUNTER — Ambulatory Visit (HOSPITAL_COMMUNITY): Payer: Medicare Other

## 2011-03-22 ENCOUNTER — Encounter (HOSPITAL_BASED_OUTPATIENT_CLINIC_OR_DEPARTMENT_OTHER): Payer: Medicare Other | Admitting: Oncology

## 2011-03-22 DIAGNOSIS — E119 Type 2 diabetes mellitus without complications: Secondary | ICD-10-CM

## 2011-03-22 DIAGNOSIS — C9110 Chronic lymphocytic leukemia of B-cell type not having achieved remission: Secondary | ICD-10-CM

## 2011-03-22 DIAGNOSIS — D638 Anemia in other chronic diseases classified elsewhere: Secondary | ICD-10-CM

## 2011-03-22 DIAGNOSIS — I10 Essential (primary) hypertension: Secondary | ICD-10-CM

## 2011-03-22 DIAGNOSIS — E039 Hypothyroidism, unspecified: Secondary | ICD-10-CM

## 2011-03-22 DIAGNOSIS — E785 Hyperlipidemia, unspecified: Secondary | ICD-10-CM

## 2011-03-22 DIAGNOSIS — E875 Hyperkalemia: Secondary | ICD-10-CM

## 2011-03-22 MED ORDER — EPOETIN ALFA 40000 UNIT/ML IJ SOLN
40000.0000 [IU] | Freq: Once | INTRAMUSCULAR | Status: AC
  Administered 2011-03-22: 40000 [IU] via SUBCUTANEOUS

## 2011-03-22 MED ORDER — EPOETIN ALFA 40000 UNIT/ML IJ SOLN
INTRAMUSCULAR | Status: AC
  Filled 2011-03-22: qty 1

## 2011-03-22 NOTE — Patient Instructions (Signed)
[facility]  Discharge Instructions  RECOMMENDATIONS MADE BY THE CONSULTANT AND ANY TEST RESULTS WILL BE SENT TO YOUR REFERRING DOCTOR.   INSTRUCTIONS GIVEN AND DISCUSSED: Return to clinic as scheduled for Procrit injections and labs.   SPECIAL INSTRUCTIONS/FOLLOW-UP: Return to clinic in 2 1/2 to 3 months to see MD.    I acknowledge that I have been informed and understand all the instructions given to me and received a copy. I do not have any more questions at this time, but understand that I may call the Specialty Clinic at [facility] at [phone] during business hours should I have any further questions or need assistance in obtaining follow-up care.    __________________________________________  _____________  __________ Signature of Patient or Authorized Representative            Date                   Time    __________________________________________ Nurse's Signature

## 2011-03-22 NOTE — Progress Notes (Signed)
Anne Melena, MD [address] [place] [address]  1. LEUKEMIA, LYMPHOCYTIC, CHRONIC  CBC, Differential, Basic metabolic panel, Lactate dehydrogenase  2. HYPERKALEMIA  Basic metabolic panel  3. Anemia of chronic disease  CBC, Differential, Iron and TIBC    INTERVAL HISTORY: Anne [name] 77 y.o. female returns for  regular  visit for followup of Stage 0 CLL and Anemia of chronic disease.  I last saw the patient on a day she was receiving her Procrit.  At that time, she had a URI.  I treated her with Augmentin.  She reports that she feels much better and the medication resolved her infection.   The patient explains that she will be undergoing a pacemaker insertion procedure.   The patient continues to complain about fatigue, but this is likely secondary to her cardiac issues.  She is caring for her husband who has alzheimer's disease and has a history of mini strokes.  Fortunately, her children help in his care.  I personally reviewed and went over laboratory results with the patient.  She denies any B-symptoms including fevers, chills, nigh sweats, change in weight, and change in appetite.  She denies any new lumps or bumps.  Past Medical History  Diagnosis Date  . CLL (chronic lymphocytic leukemia)   . Anemia   . DM (diabetes mellitus)   . Hypercholesteremia   . DJD (degenerative joint disease) of knee     bilat  . Hypertension     has LEUKEMIA, LYMPHOCYTIC, CHRONIC; HYPOTHYROIDISM; DIABETES MELLITUS, TYPE II, UNCONTROLLED; HYPERLIPIDEMIA; HYPERKALEMIA; Anemia of chronic disease; DEPRESSION; CATARACTS; HYPERTENSION; BRONCHITIS, ACUTE; ARTHRITIS; LOW BACK PAIN, CHRONIC; BURSITIS, ACROMIOCLAVICULAR, LEFT; MEMORY LOSS; FECAL INCONTINENCE; and LACERATION, HAND, LEFT on her problem list.     is allergic to lisinopril.  Anne [name] does not currently have medications on file.  Past Surgical History  Procedure Date  . Coronary artery bypass graft   . Goiter resection   .  Thyroidectomy   . Cataract extraction, bilateral   . Rectal abscess   . Replacement total knee bilateral     Denies any headaches, dizziness, double vision, fevers, chills, night sweats, nausea, vomiting, diarrhea, constipation, chest pain, heart palpitations, shortness of breath, blood in stool, black tarry stool, urinary pain, urinary burning, urinary frequency, hematuria.   PHYSICAL EXAMINATION  ECOG PERFORMANCE STATUS: 1 - Symptomatic but completely ambulatory  Filed Vitals:   03/22/11 1035  BP: 129/68  Pulse: 87  Temp: 97.3 F (36.3 C)    GENERAL:alert, no distress, well nourished, well developed, comfortable, cooperative and smiling SKIN: skin color, texture, turgor are normal HEAD: Normocephalic EYES: normal EARS: External ears normal OROPHARYNX:mucous membranes are moist  NECK: supple, no adenopathy, no bruits, no JVD, thyroid normal size, non-tender, without nodularity, no stridor, non-tender, trachea midline LYMPH:  no palpable lymphadenopathy, no hepatosplenomegaly BREAST:not examined LUNGS: clear to auscultation and percussion HEART: regular rate & rhythm, no murmurs, no gallops, S1 normal and S2 normal ABDOMEN:abdomen soft, non-tender, normal bowel sounds and no hepatosplenomegaly BACK: Back symmetric, no curvature., No CVA tenderness EXTREMITIES:less then 2 second capillary refill, no joint deformities, effusion, or inflammation, no edema, no skin discoloration, no clubbing, no cyanosis  NEURO: alert & oriented x 3 with fluent speech, no focal motor/sensory deficits, gait normal   LABORATORY DATA: CBC    Component Value Date/Time   WBC 8.7 03/07/2011 1136   RBC 3.69* 03/07/2011 1136   HGB 10.8* 03/07/2011 1136   HCT 34.7* 03/07/2011 1136   PLT 266  03/07/2011 1136   MCV 94.0 03/07/2011 1136   MCH 29.3 03/07/2011 1136   MCHC 31.1 03/07/2011 1136   RDW 15.6* 03/07/2011 1136   LYMPHSABS 5.4* 03/07/2011 1136   MONOABS 0.4 03/07/2011 1136   EOSABS 0.1  03/07/2011 1136   BASOSABS 0.0 03/07/2011 1136      ASSESSMENT:  1. Stage 0 CLL, not requiring any therapy  2. Anemia of chronic disease with a quick response to Procrit 40,000 units. This was held because her hemoglobin responded nicely and was 12 or higher. She is now 10.8 and we will therefore give her an injection today.  We will hold when hemoglobin is 11 or greater. 3. DM, uncontrolled  4. HTN  PLAN:  1. Lab work in 8-10 weeks: CBC diff, CMET, LDH, Iron/TIBC 2. Return in 10 weeks for follow-up. 3. I personally reviewed and went over laboratory results with the patient.  All questions were answered. The patient knows to call the clinic with any problems, questions or concerns. We can certainly see the patient much sooner if necessary.  Anne [name]

## 2011-04-04 ENCOUNTER — Ambulatory Visit: Payer: Medicare Other | Admitting: Internal Medicine

## 2011-04-04 ENCOUNTER — Encounter (HOSPITAL_COMMUNITY): Payer: Medicare Other | Attending: Oncology

## 2011-04-04 DIAGNOSIS — C9110 Chronic lymphocytic leukemia of B-cell type not having achieved remission: Secondary | ICD-10-CM | POA: Insufficient documentation

## 2011-04-04 DIAGNOSIS — D638 Anemia in other chronic diseases classified elsewhere: Secondary | ICD-10-CM | POA: Insufficient documentation

## 2011-04-04 LAB — CBC
Platelets: 242 10*3/uL (ref 150–400)
RBC: 3.96 MIL/uL (ref 3.87–5.11)
WBC: 8.9 10*3/uL (ref 4.0–10.5)

## 2011-04-04 NOTE — Progress Notes (Signed)
Anne [name] presented for [employer]. Labs per MD order drawn via Peripheral Line 25 gauge needle inserted in lt arm.  Good blood return present. Procedure without incident.  Needle removed intact. Patient tolerated procedure well.

## 2011-04-04 NOTE — Progress Notes (Signed)
Addended by: Edythe Lynn A on: 04/04/2011 11:16 AM   Modules accepted: Orders

## 2011-04-05 ENCOUNTER — Encounter: Payer: Self-pay | Admitting: *Deleted

## 2011-04-05 ENCOUNTER — Ambulatory Visit (INDEPENDENT_AMBULATORY_CARE_PROVIDER_SITE_OTHER): Payer: Medicare Other | Admitting: Internal Medicine

## 2011-04-05 ENCOUNTER — Encounter: Payer: Self-pay | Admitting: Internal Medicine

## 2011-04-05 DIAGNOSIS — I255 Ischemic cardiomyopathy: Secondary | ICD-10-CM | POA: Insufficient documentation

## 2011-04-05 DIAGNOSIS — I519 Heart disease, unspecified: Secondary | ICD-10-CM

## 2011-04-05 DIAGNOSIS — I2589 Other forms of chronic ischemic heart disease: Secondary | ICD-10-CM

## 2011-04-05 DIAGNOSIS — I10 Essential (primary) hypertension: Secondary | ICD-10-CM

## 2011-04-05 NOTE — Assessment & Plan Note (Signed)
As above.

## 2011-04-05 NOTE — Progress Notes (Signed)
Primary Care Physician: [name], MD Referring Physician:  Dr Alveda Reasons is a pleasant 77 y.o. patient with a h/o CAD s/p CABG, ischemic CM (EF25-30%), and LBBB who presents today for EP consultation regarding possible CRT implant.  The patient underwent CABG 1/11.  She reports gradual but progressive decline since that time.  She reports fatigue and decreased energy chronically.  She reports SOB with moderate activity.  She reports compliance with medication and is presently on an optimal medication regimen.  Today, she denies symptoms of palpitations, chest pain, orthopnea, PND, lower extremity edema, dizziness, presyncope, syncope, or neurologic sequela. The patient is tolerating medications without difficulties and is otherwise without complaint today.   Past Medical History  Diagnosis Date  . CLL (chronic lymphocytic leukemia)     stable  . Anemia   . DM (diabetes mellitus)   . Hypercholesteremia   . DJD (degenerative joint disease) of knee     bilat  . Hypertension   . Coronary artery disease     three-vessel CAD with CABG -- x5 -- left internal mammary artery to LAD, sequential saphenous vein graft to first diagonal and first obtuse marginal, sequential saphenous vein graft to posterior descending and obtuse marginal 2 (posterolateral), endoscopic vein harvest of righ leg   Past Surgical History  Procedure Date  . Coronary artery bypass graft 06/22/2009    x5 -- left internal mammary artery to LAD, sequential saphenous vein graft to first diagonal and first obtuse marginal, sequential saphenous vein graft to posterior descending and obtuse marginal 2 (posterolateral), endoscopic vein harvest of righ leg  . Goiter resection   . Thyroidectomy 1970s  . Cataract extraction, bilateral   . Rectal abscess   . Replacement total knee bilateral     bilateral  . Cardiac catheterization 01/18/2011    Est. EF of 25% to 30 - Mildly reduced cardiac output with no signs of  decompensated heart failure and normal right heart filling pressures -- Patent bypass grafts with underlying three-vessel coronary artery disease  . Cardiac catheterization 06/14/2009    Est. EF of of 45% - Multivessel coronary artery disease with high-grade lesions in the LAD, left circumflex, and RCA -- Mildly reduced left ventricular function -- No significant aortic stenosis or mitral regurgitation    Current Outpatient Prescriptions  Medication Sig Dispense Refill  . aspirin 81 MG EC tablet Take 81 mg by mouth daily.        . carvedilol (COREG) 6.25 MG tablet Take 6.25 mg by mouth 2 (two) times daily with a meal.        . citalopram (CELEXA) 10 MG tablet Take 10 mg by mouth 2 (two) times daily.       . Coenzyme Q10 (CO Q-10 PO) Take by mouth daily.        Marland Kitchen DIOVAN 160 MG tablet Take 160 mg by mouth daily. 1/2 at night      . Docusate Calcium (STOOL SOFTENER PO) Take by mouth.        . furosemide (LASIX) 20 MG tablet Take 40 mg by mouth daily.       Marland Kitchen glimepiride (AMARYL) 4 MG tablet Take 4 mg by mouth daily before breakfast.       . KLOR-CON M20 20 MEQ tablet Take 20 mEq by mouth daily.       Marland Kitchen LORazepam (ATIVAN) 0.5 MG tablet Take 0.5 mg by mouth every 8 (eight) hours.        . Multiple  Vitamins-Minerals (CENTRUM SILVER PO) Take 1 tablet by mouth daily.        Marland Kitchen NAMENDA 10 MG tablet Take 10 mg by mouth 2 (two) times daily.       . TRADJENTA 5 MG TABS tablet       . traMADol (ULTRAM) 50 MG tablet Take 50 mg by mouth every 6 (six) hours as needed.         Allergies  Allergen Reactions  . Lisinopril     REACTION: Dry Cough    History   Social History  . Marital Status: Married    Spouse Name: N/A    Number of Children: N/A  . Years of Education: N/A   Occupational History  . Not on file.   Social History Main Topics  . Smoking status: Never Smoker   . Smokeless tobacco: Never Used  . Alcohol Use: No  . Drug Use: No  . Sexually Active: No   Other Topics Concern  .  Not on file   Social History Narrative   [employer] in [place].  Engineer, petroleum prior to retiring.    No family history on file.  ROS- All systems are reviewed and negative except as per the HPI above  Physical Exam: Filed Vitals:   04/05/11 1620  BP: 130/78  Pulse: 88  Height: 5\' 5"  (1.651 m)  Weight: 144 lb 12.8 oz (65.681 kg)    GEN- The patient is thin and elderly appearing, alert and oriented x 3 today.   Head- normocephalic, atraumatic Eyes-  Sclera clear, conjunctiva pink Ears- hearing intact Oropharynx- clear Neck- supple, no JVP Lymph- no cervical lymphadenopathy Lungs- Clear to ausculation bilaterally, normal work of breathing Heart- Regular rate and rhythm, no murmurs, rubs or gallops, PMI not laterally displaced GI- soft, NT, ND, + BS Extremities- no clubbing, cyanosis, trace edema MS- age appropriate muscle atrophy Skin- no rash or lesion Psych- euthymic mood, full affect Neuro- strength and sensation are intact  EKG today reveals sinus rhythm 89 bpm, LBBB (QRS ) Cath 8/12 reviewed and reveals patient grafts  Assessment and Plan:

## 2011-04-05 NOTE — Assessment & Plan Note (Signed)
Stable No change required today  

## 2011-04-05 NOTE — Assessment & Plan Note (Signed)
The patient has an ischemic CM (EF 25-30%), NYHA Class III CHF, and CAD.  She has a LBBB with QRS .  At this time, she meets MADIT II/ SCD-HeFT criteria for ICD implantation for primary prevention of sudden death.  However, given her advanced age, mild dementia, and comorbidities, she would be a poor defibrillator candidate.  In addition, she is very clear that her life preference to not have a defibrillator implanted.   She does however have a Class Ia indication for cardiac resynchronization therapy (with a biventricular pacemaker).  CRT may offer significant improvement in CHF symptoms with improved quality of life and also improved mortality with resynchronization therapy alone. Risks, benefits, alternatives to biventricular pacemaker implantation were discussed in detail with the patient and her daughter today. The patient  understands that the risks include but are not limited to bleeding, infection, pneumothorax, perforation, tamponade, vascular damage, renal failure, MI, stroke, death, inability to place an LV lead, and lead dislodgement.  She accepts these risks and wishes to proceed.  We will therefore schedule biventricular pacemaker implantation at the next available time.  No medication changes are made today.

## 2011-04-05 NOTE — Patient Instructions (Signed)

## 2011-04-09 ENCOUNTER — Telehealth (HOSPITAL_COMMUNITY): Payer: Self-pay | Admitting: *Deleted

## 2011-04-09 NOTE — Telephone Encounter (Signed)
Daughter notified that we will do cbc 11/26 and poss procrit

## 2011-04-10 ENCOUNTER — Other Ambulatory Visit: Payer: Self-pay | Admitting: Internal Medicine

## 2011-04-10 ENCOUNTER — Encounter (HOSPITAL_COMMUNITY): Payer: Self-pay | Admitting: Pharmacy Technician

## 2011-04-11 ENCOUNTER — Other Ambulatory Visit: Payer: Self-pay | Admitting: Internal Medicine

## 2011-04-11 DIAGNOSIS — I5022 Chronic systolic (congestive) heart failure: Secondary | ICD-10-CM

## 2011-04-11 LAB — PROTIME-INR
INR: 1.1 (ref <1.50)
Prothrombin Time: 14.6 seconds (ref 11.6–15.2)

## 2011-04-11 LAB — CBC
HCT: 37.9 % (ref 36.0–46.0)
Hemoglobin: 11.3 g/dL — ABNORMAL LOW (ref 12.0–15.0)
RBC: 4.29 MIL/uL (ref 3.87–5.11)

## 2011-04-11 LAB — BASIC METABOLIC PANEL
CO2: 25 mEq/L (ref 19–32)
Chloride: 106 mEq/L (ref 96–112)
Potassium: 4.6 mEq/L (ref 3.5–5.3)

## 2011-04-11 NOTE — H&P (Signed)
Primary Care Physician: HALL,ZACK, MD  Referring Physician: Dr Hilty  Anne [name] is a pleasant 77 y.o. patient with a h/o CAD s/p CABG, ischemic CM (EF25-30%), and LBBB who presents today for EP consultation regarding possible CRT implant. The patient underwent CABG 1/11. She reports gradual but progressive decline since that time. She reports fatigue and decreased energy chronically. She reports SOB with moderate activity. She reports compliance with medication and is presently on an optimal medication regimen.  Today, she denies symptoms of palpitations, chest pain, orthopnea, PND, lower extremity edema, dizziness, presyncope, syncope, or neurologic sequela. The patient is tolerating medications without difficulties and is otherwise without complaint today.  Past Medical History   Diagnosis  Date   .  CLL (chronic lymphocytic leukemia)      stable   .  Anemia    .  DM (diabetes mellitus)    .  Hypercholesteremia    .  DJD (degenerative joint disease) of knee      bilat   .  Hypertension    .  Coronary artery disease      three-vessel CAD with CABG -- x5 -- left internal mammary artery to LAD, sequential saphenous vein graft to first diagonal and first obtuse marginal, sequential saphenous vein graft to posterior descending and obtuse marginal 2 (posterolateral), endoscopic vein harvest of righ leg    Past Surgical History   Procedure  Date   .  Coronary artery bypass graft  06/22/2009     x5 -- left internal mammary artery to LAD, sequential saphenous vein graft to first diagonal and first obtuse marginal, sequential saphenous vein graft to posterior descending and obtuse marginal 2 (posterolateral), endoscopic vein harvest of righ leg   .  Goiter resection    .  Thyroidectomy  1970s   .  Cataract extraction, bilateral    .  Rectal abscess    .  Replacement total knee bilateral      bilateral   .  Cardiac catheterization  01/18/2011     Est. EF of 25% to 30 - Mildly reduced cardiac  output with no signs of decompensated heart failure and normal right heart filling pressures -- Patent bypass grafts with underlying three-vessel coronary artery disease   .  Cardiac catheterization  06/14/2009     Est. EF of of 45% - Multivessel coronary artery disease with high-grade lesions in the LAD, left circumflex, and RCA -- Mildly reduced left ventricular function -- No significant aortic stenosis or mitral regurgitation    Current Outpatient Prescriptions   Medication  Sig  Dispense  Refill   .  aspirin 81 MG EC tablet  Take 81 mg by mouth daily.     .  carvedilol (COREG) 6.25 MG tablet  Take 6.25 mg by mouth 2 (two) times daily with a meal.     .  citalopram (CELEXA) 10 MG tablet  Take 10 mg by mouth 2 (two) times daily.     .  Coenzyme Q10 (CO Q-10 PO)  Take by mouth daily.     .  DIOVAN 160 MG tablet  Take 160 mg by mouth daily. 1/2 at night     .  Docusate Calcium (STOOL SOFTENER PO)  Take by mouth.     .  furosemide (LASIX) 20 MG tablet  Take 40 mg by mouth daily.     .  glimepiride (AMARYL) 4 MG tablet  Take 4 mg by mouth daily before breakfast.     .    KLOR-CON M20 20 MEQ tablet  Take 20 mEq by mouth daily.     .  LORazepam (ATIVAN) 0.5 MG tablet  Take 0.5 mg by mouth every 8 (eight) hours.     .  Multiple Vitamins-Minerals (CENTRUM SILVER PO)  Take 1 tablet by mouth daily.     .  NAMENDA 10 MG tablet  Take 10 mg by mouth 2 (two) times daily.     .  TRADJENTA 5 MG TABS tablet      .  traMADol (ULTRAM) 50 MG tablet  Take 50 mg by mouth every 6 (six) hours as needed.      Allergies   Allergen  Reactions   .  Lisinopril      REACTION: Dry Cough    History    Social History   .  Marital Status:  Married     Spouse Name:  N/A     Number of Children:  N/A   .  Years of Education:  N/A    Occupational History   .  Not on file.    Social History Main Topics   .  Smoking status:  Never Smoker   .  Smokeless tobacco:  Never Used   .  Alcohol Use:  No   .  Drug Use:  No    .  Sexually Active:  No    Other Topics  Concern   .  Not on file    Social History Narrative    [employer] in [place]. Cafeteria worker prior to retiring.    No family history on file.  ROS- All systems are reviewed and negative except as per the HPI above  Physical Exam:  Filed Vitals:    04/05/11 1620   BP:  130/78   Pulse:  88   Height:  5' 5" (1.651 m)   Weight:  144 lb 12.8 oz (65.681 kg)    GEN- The patient is thin and elderly appearing, alert and oriented x 3 today.  Head- normocephalic, atraumatic  Eyes- Sclera clear, conjunctiva pink  Ears- hearing intact  Oropharynx- clear  Neck- supple, no JVP  Lymph- no cervical lymphadenopathy  Lungs- Clear to ausculation bilaterally, normal work of breathing  Heart- Regular rate and rhythm, no murmurs, rubs or gallops, PMI not laterally displaced  GI- soft, NT, ND, + BS  Extremities- no clubbing, cyanosis, trace edema  MS- age appropriate muscle atrophy  Skin- no rash or lesion  Psych- euthymic mood, full affect  Neuro- strength and sensation are intact  EKG today reveals sinus rhythm 89 bpm, LBBB (QRS 160ms)  Cath 8/12 reviewed and reveals patient grafts  Assessment and Plan:   Chronic systolic dysfunction of left ventricle - [name], MD 04/05/2011 8:18 PM Signed  The patient has an ischemic CM (EF 25-30%), NYHA Class III CHF, and CAD. She has a LBBB with QRS 160ms. At this time, she meets MADIT II/ SCD-HeFT criteria for ICD implantation for primary prevention of sudden death. However, given her advanced age, mild dementia, and comorbidities, she would be a poor defibrillator candidate. In addition, she is very clear that her life preference to not have a defibrillator implanted.  She does however have a Class Ia indication for cardiac resynchronization therapy (with a biventricular pacemaker). CRT may offer significant improvement in CHF symptoms with improved quality of life and also improved mortality with  resynchronization therapy alone.  Risks, benefits, alternatives to biventricular pacemaker implantation were discussed in detail with the   patient and her daughter today. The patient understands that the risks include but are not limited to bleeding, infection, pneumothorax, perforation, tamponade, vascular damage, renal failure, MI, stroke, death, inability to place an LV lead, and lead dislodgement. She accepts these risks and wishes to proceed. We will therefore schedule biventricular pacemaker implantation at the next available time.  No medication changes are made today. Ischemic cardiomyopathy - Neev Mcmains, MD 04/05/2011 8:18 PM Signed  As above HYPERTENSION - Jermaine Neuharth, MD 04/05/2011 8:18 PM Signed  Stable  No change required today     

## 2011-04-15 MED ORDER — SODIUM CHLORIDE 0.9 % IR SOLN
80.0000 mg | Status: DC
  Filled 2011-04-15 (×3): qty 2

## 2011-04-16 ENCOUNTER — Ambulatory Visit (HOSPITAL_COMMUNITY)
Admission: RE | Admit: 2011-04-16 | Discharge: 2011-04-17 | Disposition: A | Discharge status: Home / Self Care | Payer: Medicare Other | Source: Ambulatory Visit | Attending: Internal Medicine | Admitting: Internal Medicine

## 2011-04-16 ENCOUNTER — Encounter (HOSPITAL_COMMUNITY): Payer: Self-pay | Admitting: General Practice

## 2011-04-16 ENCOUNTER — Encounter (HOSPITAL_COMMUNITY)
Admission: RE | Disposition: A | Discharge status: Home / Self Care | Payer: Self-pay | Source: Ambulatory Visit | Attending: Internal Medicine

## 2011-04-16 DIAGNOSIS — I509 Heart failure, unspecified: Secondary | ICD-10-CM | POA: Insufficient documentation

## 2011-04-16 DIAGNOSIS — D630 Anemia in neoplastic disease: Secondary | ICD-10-CM | POA: Insufficient documentation

## 2011-04-16 DIAGNOSIS — IMO0001 Reserved for inherently not codable concepts without codable children: Secondary | ICD-10-CM | POA: Diagnosis present

## 2011-04-16 DIAGNOSIS — I10 Essential (primary) hypertension: Secondary | ICD-10-CM | POA: Diagnosis present

## 2011-04-16 DIAGNOSIS — I428 Other cardiomyopathies: Secondary | ICD-10-CM | POA: Insufficient documentation

## 2011-04-16 DIAGNOSIS — I5022 Chronic systolic (congestive) heart failure: Secondary | ICD-10-CM | POA: Insufficient documentation

## 2011-04-16 DIAGNOSIS — I447 Left bundle-branch block, unspecified: Secondary | ICD-10-CM | POA: Insufficient documentation

## 2011-04-16 DIAGNOSIS — I2510 Atherosclerotic heart disease of native coronary artery without angina pectoris: Secondary | ICD-10-CM | POA: Insufficient documentation

## 2011-04-16 DIAGNOSIS — I519 Heart disease, unspecified: Secondary | ICD-10-CM | POA: Diagnosis present

## 2011-04-16 DIAGNOSIS — E119 Type 2 diabetes mellitus without complications: Secondary | ICD-10-CM | POA: Insufficient documentation

## 2011-04-16 DIAGNOSIS — I255 Ischemic cardiomyopathy: Secondary | ICD-10-CM | POA: Diagnosis present

## 2011-04-16 DIAGNOSIS — C9110 Chronic lymphocytic leukemia of B-cell type not having achieved remission: Secondary | ICD-10-CM | POA: Insufficient documentation

## 2011-04-16 HISTORY — DX: Reserved for inherently not codable concepts without codable children: IMO0001

## 2011-04-16 HISTORY — DX: Heart failure, unspecified: I50.9

## 2011-04-16 HISTORY — PX: PACEMAKER INSERTION: SHX728

## 2011-04-16 HISTORY — PX: BI-VENTRICULAR PACEMAKER INSERTION: SHX5462

## 2011-04-16 HISTORY — DX: Shortness of breath: R06.02

## 2011-04-16 HISTORY — DX: Encounter for other specified aftercare: Z51.89

## 2011-04-16 LAB — GLUCOSE, CAPILLARY
Glucose-Capillary: 117 mg/dL — ABNORMAL HIGH (ref 70–99)
Glucose-Capillary: 159 mg/dL — ABNORMAL HIGH (ref 70–99)
Glucose-Capillary: 175 mg/dL — ABNORMAL HIGH (ref 70–99)

## 2011-04-16 LAB — SURGICAL PCR SCREEN: Staphylococcus aureus: NEGATIVE

## 2011-04-16 SURGERY — BI-VENTRICULAR PACEMAKER INSERTION (CRT-P)
Anesthesia: LOCAL

## 2011-04-16 MED ORDER — SODIUM CHLORIDE 0.45 % IV SOLN: INTRAVENOUS | Status: DC

## 2011-04-16 MED ORDER — ONDANSETRON HCL 4 MG/2ML IJ SOLN: 4.0000 mg | Freq: Four times a day (QID) | INTRAMUSCULAR | Status: DC | PRN

## 2011-04-16 MED ORDER — SODIUM CHLORIDE 0.9 % IJ SOLN
3.0000 mL | Freq: Two times a day (BID) | INTRAMUSCULAR | Status: DC
  Administered 2011-04-16: 3 mL via INTRAVENOUS

## 2011-04-16 MED ORDER — CEFAZOLIN SODIUM 1-5 GM-% IV SOLN: 1.0000 g | INTRAVENOUS | Status: DC

## 2011-04-16 MED ORDER — CEFAZOLIN SODIUM 1-5 GM-% IV SOLN
1.0000 g | Freq: Four times a day (QID) | INTRAVENOUS | Status: AC
  Administered 2011-04-16 – 2011-04-17 (×3): 1 g via INTRAVENOUS
  Filled 2011-04-16 (×3): qty 50

## 2011-04-16 MED ORDER — HEPARIN (PORCINE) IN NACL 2-0.9 UNIT/ML-% IJ SOLN
INTRAMUSCULAR | Status: AC
  Filled 2011-04-16: qty 1000

## 2011-04-16 MED ORDER — POTASSIUM CHLORIDE CRYS ER 20 MEQ PO TBCR
20.0000 meq | EXTENDED_RELEASE_TABLET | Freq: Every day | ORAL | Status: DC
  Administered 2011-04-17: 20 meq via ORAL
  Filled 2011-04-16: qty 1

## 2011-04-16 MED ORDER — CITALOPRAM HYDROBROMIDE 10 MG PO TABS
10.0000 mg | ORAL_TABLET | Freq: Two times a day (BID) | ORAL | Status: DC
  Administered 2011-04-16 – 2011-04-17 (×2): 10 mg via ORAL
  Filled 2011-04-16 (×4): qty 1

## 2011-04-16 MED ORDER — CEFAZOLIN SODIUM 1-5 GM-% IV SOLN
INTRAVENOUS | Status: AC
  Administered 2011-04-16: 1 g via INTRAVENOUS
  Filled 2011-04-16: qty 50

## 2011-04-16 MED ORDER — ACETAMINOPHEN 325 MG PO TABS: 325.0000 mg | ORAL_TABLET | ORAL | Status: DC | PRN

## 2011-04-16 MED ORDER — ACETAMINOPHEN 500 MG PO TABS
1000.0000 mg | ORAL_TABLET | Freq: Four times a day (QID) | ORAL | Status: DC
  Administered 2011-04-16 – 2011-04-17 (×3): 1000 mg via ORAL
  Filled 2011-04-16 (×4): qty 2

## 2011-04-16 MED ORDER — LORAZEPAM 0.5 MG PO TABS
0.5000 mg | ORAL_TABLET | Freq: Three times a day (TID) | ORAL | Status: DC | PRN
  Administered 2011-04-17: 0.5 mg via ORAL
  Filled 2011-04-16: qty 1

## 2011-04-16 MED ORDER — TRAMADOL HCL 50 MG PO TABS
50.0000 mg | ORAL_TABLET | Freq: Every day | ORAL | Status: DC
  Administered 2011-04-16: 50 mg via ORAL
  Filled 2011-04-16 (×2): qty 1

## 2011-04-16 MED ORDER — MIDAZOLAM HCL 5 MG/5ML IJ SOLN
INTRAMUSCULAR | Status: AC
  Filled 2011-04-16: qty 5

## 2011-04-16 MED ORDER — CARVEDILOL 6.25 MG PO TABS
9.3750 mg | ORAL_TABLET | Freq: Two times a day (BID) | ORAL | Status: DC
  Administered 2011-04-16 – 2011-04-17 (×2): 9.375 mg via ORAL
  Filled 2011-04-16 (×4): qty 1

## 2011-04-16 MED ORDER — SODIUM CHLORIDE 0.9 % IV SOLN: 250.0000 mL | INTRAVENOUS | Status: DC

## 2011-04-16 MED ORDER — MUPIROCIN 2 % EX OINT
TOPICAL_OINTMENT | CUTANEOUS | Status: AC
  Administered 2011-04-16: 1 via NASAL
  Filled 2011-04-16: qty 22

## 2011-04-16 MED ORDER — LIDOCAINE HCL (PF) 1 % IJ SOLN
INTRAMUSCULAR | Status: AC
  Filled 2011-04-16: qty 60

## 2011-04-16 MED ORDER — LINAGLIPTIN 5 MG PO TABS
2.5000 mg | ORAL_TABLET | Freq: Every day | ORAL | Status: DC
  Administered 2011-04-16 – 2011-04-17 (×2): 2.5 mg via ORAL
  Filled 2011-04-16 (×3): qty 1

## 2011-04-16 MED ORDER — HYDROCODONE-ACETAMINOPHEN 5-325 MG PO TABS: 1.0000 | ORAL_TABLET | ORAL | Status: DC | PRN

## 2011-04-16 MED ORDER — OLMESARTAN 10 MG HALF TABLET
10.0000 mg | ORAL_TABLET | Freq: Every day | ORAL | Status: DC
  Administered 2011-04-16 – 2011-04-17 (×2): 10 mg via ORAL
  Filled 2011-04-16 (×2): qty 1

## 2011-04-16 MED ORDER — SODIUM CHLORIDE 0.9 % IJ SOLN: 3.0000 mL | INTRAMUSCULAR | Status: DC | PRN

## 2011-04-16 MED ORDER — GLIMEPIRIDE 4 MG PO TABS
4.0000 mg | ORAL_TABLET | Freq: Every day | ORAL | Status: DC
  Administered 2011-04-16 – 2011-04-17 (×2): 4 mg via ORAL
  Filled 2011-04-16 (×3): qty 1

## 2011-04-16 MED ORDER — FUROSEMIDE 20 MG PO TABS
20.0000 mg | ORAL_TABLET | Freq: Every day | ORAL | Status: DC
  Administered 2011-04-17: 20 mg via ORAL
  Filled 2011-04-16 (×2): qty 1

## 2011-04-16 MED ORDER — ASPIRIN EC 81 MG PO TBEC
81.0000 mg | DELAYED_RELEASE_TABLET | Freq: Every day | ORAL | Status: DC
  Administered 2011-04-16: 81 mg via ORAL
  Filled 2011-04-16 (×2): qty 1

## 2011-04-16 MED ORDER — TRAMADOL HCL 50 MG PO TABS
50.0000 mg | ORAL_TABLET | Freq: Every day | ORAL | Status: DC
  Filled 2011-04-16: qty 1

## 2011-04-16 MED ORDER — FENTANYL CITRATE 0.05 MG/ML IJ SOLN
INTRAMUSCULAR | Status: AC
  Filled 2011-04-16: qty 2

## 2011-04-16 MED ORDER — MEMANTINE HCL 10 MG PO TABS
10.0000 mg | ORAL_TABLET | Freq: Two times a day (BID) | ORAL | Status: DC
  Administered 2011-04-16 – 2011-04-17 (×2): 10 mg via ORAL
  Filled 2011-04-16 (×4): qty 1

## 2011-04-16 MED ORDER — SODIUM CHLORIDE 0.9 % IV SOLN
INTRAVENOUS | Status: DC
  Administered 2011-04-16: 10:00:00 via INTRAVENOUS

## 2011-04-16 NOTE — Interval H&P Note (Signed)
History and Physical Interval Note:   04/16/2011   10:36 AM   Anne [name]  has presented today for surgery, with the diagnosis of ischemic cardiomyopathy  The various methods of treatment have been discussed with the patient and family. After consideration of risks, benefits and other options for treatment, the patient has consented to  Procedure(s): BI-VENTRICULAR PACEMAKER INSERTION (CRT-P) as a surgical intervention .  The patients' history has been reviewed, patient re-examined, no change in status, stable for surgery.  I have reviewed the patients' chart and labs.  Questions were answered to the patient's satisfaction.     Hillis Range  MD 04/16/2011 10:37 AM

## 2011-04-16 NOTE — H&P (View-Only) (Signed)
Primary Care Physician: [name], MD  Referring Physician: Dr Alveda Reasons is a pleasant 77 y.o. patient with a h/o CAD s/p CABG, ischemic CM (EF25-30%), and LBBB who presents today for EP consultation regarding possible CRT implant. The patient underwent CABG 1/11. She reports gradual but progressive decline since that time. She reports fatigue and decreased energy chronically. She reports SOB with moderate activity. She reports compliance with medication and is presently on an optimal medication regimen.  Today, she denies symptoms of palpitations, chest pain, orthopnea, PND, lower extremity edema, dizziness, presyncope, syncope, or neurologic sequela. The patient is tolerating medications without difficulties and is otherwise without complaint today.  Past Medical History   Diagnosis  Date   .  CLL (chronic lymphocytic leukemia)      stable   .  Anemia    .  DM (diabetes mellitus)    .  Hypercholesteremia    .  DJD (degenerative joint disease) of knee      bilat   .  Hypertension    .  Coronary artery disease      three-vessel CAD with CABG -- x5 -- left internal mammary artery to LAD, sequential saphenous vein graft to first diagonal and first obtuse marginal, sequential saphenous vein graft to posterior descending and obtuse marginal 2 (posterolateral), endoscopic vein harvest of righ leg    Past Surgical History   Procedure  Date   .  Coronary artery bypass graft  06/22/2009     x5 -- left internal mammary artery to LAD, sequential saphenous vein graft to first diagonal and first obtuse marginal, sequential saphenous vein graft to posterior descending and obtuse marginal 2 (posterolateral), endoscopic vein harvest of righ leg   .  Goiter resection    .  Thyroidectomy  1970s   .  Cataract extraction, bilateral    .  Rectal abscess    .  Replacement total knee bilateral      bilateral   .  Cardiac catheterization  01/18/2011     Est. EF of 25% to 30 - Mildly reduced cardiac  output with no signs of decompensated heart failure and normal right heart filling pressures -- Patent bypass grafts with underlying three-vessel coronary artery disease   .  Cardiac catheterization  06/14/2009     Est. EF of of 45% - Multivessel coronary artery disease with high-grade lesions in the LAD, left circumflex, and RCA -- Mildly reduced left ventricular function -- No significant aortic stenosis or mitral regurgitation    Current Outpatient Prescriptions   Medication  Sig  Dispense  Refill   .  aspirin 81 MG EC tablet  Take 81 mg by mouth daily.     .  carvedilol (COREG) 6.25 MG tablet  Take 6.25 mg by mouth 2 (two) times daily with a meal.     .  citalopram (CELEXA) 10 MG tablet  Take 10 mg by mouth 2 (two) times daily.     .  Coenzyme Q10 (CO Q-10 PO)  Take by mouth daily.     Marland Kitchen  DIOVAN 160 MG tablet  Take 160 mg by mouth daily. 1/2 at night     .  Docusate Calcium (STOOL SOFTENER PO)  Take by mouth.     .  furosemide (LASIX) 20 MG tablet  Take 40 mg by mouth daily.     Marland Kitchen  glimepiride (AMARYL) 4 MG tablet  Take 4 mg by mouth daily before breakfast.     .  KLOR-CON M20 20 MEQ tablet  Take 20 mEq by mouth daily.     Marland Kitchen  LORazepam (ATIVAN) 0.5 MG tablet  Take 0.5 mg by mouth every 8 (eight) hours.     .  Multiple Vitamins-Minerals (CENTRUM SILVER PO)  Take 1 tablet by mouth daily.     Marland Kitchen  NAMENDA 10 MG tablet  Take 10 mg by mouth 2 (two) times daily.     .  TRADJENTA 5 MG TABS tablet      .  traMADol (ULTRAM) 50 MG tablet  Take 50 mg by mouth every 6 (six) hours as needed.      Allergies   Allergen  Reactions   .  Lisinopril      REACTION: Dry Cough    History    Social History   .  Marital Status:  Married     Spouse Name:  N/A     Number of Children:  N/A   .  Years of Education:  N/A    Occupational History   .  Not on file.    Social History Main Topics   .  Smoking status:  Never Smoker   .  Smokeless tobacco:  Never Used   .  Alcohol Use:  No   .  Drug Use:  No    .  Sexually Active:  No    Other Topics  Concern   .  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    No family history on file.  ROS- All systems are reviewed and negative except as per the HPI above  Physical Exam:  Filed Vitals:    04/05/11 1620   BP:  130/78   Pulse:  88   Height:  5\' 5"  (1.651 m)   Weight:  144 lb 12.8 oz (65.681 kg)    GEN- The patient is thin and elderly appearing, alert and oriented x 3 today.  Head- normocephalic, atraumatic  Eyes- Sclera clear, conjunctiva pink  Ears- hearing intact  Oropharynx- clear  Neck- supple, no JVP  Lymph- no cervical lymphadenopathy  Lungs- Clear to ausculation bilaterally, normal work of breathing  Heart- Regular rate and rhythm, no murmurs, rubs or gallops, PMI not laterally displaced  GI- soft, NT, ND, + BS  Extremities- no clubbing, cyanosis, trace edema  MS- age appropriate muscle atrophy  Skin- no rash or lesion  Psych- euthymic mood, full affect  Neuro- strength and sensation are intact  EKG today reveals sinus rhythm 89 bpm, LBBB (QRS )  Cath 8/12 reviewed and reveals patient grafts  Assessment and Plan:   Chronic systolic dysfunction of left ventricle - Hillis Range, MD 04/05/2011 8:18 PM Signed  The patient has an ischemic CM (EF 25-30%), NYHA Class III CHF, and CAD. She has a LBBB with QRS . At this time, she meets MADIT II/ SCD-HeFT criteria for ICD implantation for primary prevention of sudden death. However, given her advanced age, mild dementia, and comorbidities, she would be a poor defibrillator candidate. In addition, she is very clear that her life preference to not have a defibrillator implanted.  She does however have a Class Ia indication for cardiac resynchronization therapy (with a biventricular pacemaker). CRT may offer significant improvement in CHF symptoms with improved quality of life and also improved mortality with  resynchronization therapy alone.  Risks, benefits, alternatives to biventricular pacemaker implantation were discussed in detail with the  patient and her daughter today. The patient understands that the risks include but are not limited to bleeding, infection, pneumothorax, perforation, tamponade, vascular damage, renal failure, MI, stroke, death, inability to place an LV lead, and lead dislodgement. She accepts these risks and wishes to proceed. We will therefore schedule biventricular pacemaker implantation at the next available time.  No medication changes are made today. Ischemic cardiomyopathy - Hillis Range, MD 04/05/2011 8:18 PM Signed  As above HYPERTENSION - Hillis Range, MD 04/05/2011 8:18 PM Signed  Stable  No change required today

## 2011-04-16 NOTE — Brief Op Note (Signed)
04/16/2011  1:00 PM  PATIENT:  Anne [name]  77 y.o. female  PRE-OPERATIVE DIAGNOSIS:  ischemic cardiomyopathy  POST-OPERATIVE DIAGNOSIS:  same  PROCEDURE:  Procedure(s): BI-VENTRICULAR PACEMAKER INSERTION (CRT-P)  SURGEON:  Surgeon(s): Gardiner Rhyme, MD   ANESTHESIA:   local  EBL:   5cc  BLOOD ADMINISTERED:none  DRAINS: none   LOCAL MEDICATIONS USED:  LIDOCAINE 8CC  SPECIMEN:  No Specimen  DISPOSITION OF SPECIMEN:  N/A  COUNTS:  YES  TOURNIQUET:  * No tourniquets in log *  DICTATION: .Note written in EPIC  PLAN OF CARE: Admit for overnight observation  PATIENT DISPOSITION:  PACU - hemodynamically stable.   Delay start of Pharmacological VTE agent (>24hrs) due to surgical blood loss or risk of bleeding:  {YES/NO/NOT APPLICABLE:20182

## 2011-04-16 NOTE — Op Note (Signed)
SURGEON:  Hillis Range, MD      PREPROCEDURE DIAGNOSES:   1. Nonischemic cardiomyopathy.   2. New York Heart Association class III, heart failure chronically.   3. Left bundle-branch block.      POSTPROCEDURE DIAGNOSES:   1. Nonischemic cardiomyopathy.   2. New York Heart Association class III heart failure chronically.   3. Left bundle-branch block.      PROCEDURES:    1. Left upper extremity venography  2. Biventricular pacemaker implantation.     INTRODUCTION:  Anne [name] is a 77 y.o. female with an ischemic CM, NYHA Class III CHF, and LBBB QRS morophology. At this time, she  Has significant symptoms of CHF despite an optimal medical regimen.  Given LBBB, the patient may also be expected to benefit from resynchronization therapy.  She is very clear in her decision to decline defibrillator implantation, but would like to proceed with biventricular pacemaker implantation for symptomatic treatment of her heart failure.   she therefore  presents today for a biventricular pacemaker implantation.      DESCRIPTION OF PROCEDURE:  Informed written consent was obtained and the patient was brought to the electrophysiology lab in the fasting state. The patient was adequately sedated with intravenous Versed, and fentanyl as outlined in the nursing report.  The patient's left chest was prepped and draped in the usual sterile fashion by the EP lab staff.  The skin overlying the left deltopectoral region was infiltrated with lidocaine for local analgesia.  A 5-cm incision was made over the left deltopectoral region.  A left subcutaneous pacemaker pocket was fashioned using a combination of sharp and blunt dissection.  Electrocautery was used to assure hemostasis.   Left Upper extremity Venography:  A venogram of the left upper extremity was performed which revealed a moderate sized left axillary vein which emptied into a moderate sized left subclavian vein.    RA/RV Lead Placement: The left axillary  vein was cannulated with fluoroscopic visualization.  Through the left axillary vein, a St. Jude Medical Tendril STS, model 7829FA-21  (serial # [id number] ) right atrial lead and a St. Jude Medical Tendril STS, model [phone] number [id number]) right ventricular pacemaker lead were advanced with fluoroscopic visualization into the right atrial appendage and right ventricular apex positions respectively.  Initial atrial lead P-waves measured 1.9 mV with an impedance of 433 ohms and a threshold of 1.0 volts at 0.4 milliseconds.  The right ventricular lead R-wave measured 10.4 mV with impedance of 614 ohms and a threshold of 1.4 volts at 0.4 milliseconds.   LV Lead Placement: A Medtronic MB-2 guide was advanced through the left axillary vein into the low lateral right atrium.  A Bard curved Damato catheter was introduced through the MB-2 guide and used to cannulate the coronary sinus.  Coronary sinus cannulation was confirmed with electrogram recording from the hexapolar catheter.  A coronary sinus selective venography balloon was advanced through the MB- 2 guide and advanced into the proximal portion of the coronary sinus.  A selective coronary sinus venogram was performed by hand injection of nonionic contrast.  This demonstrated a moderate sized coronary sinus.  A moderate sized proximal posterolateral branch was visualized in its proximal segment only.  The distal posterolateral branch could not be visualized.  No other posterior branches were identified.   A Whisper CSJ wire was introduced through the transseptal sheath and advanced into a distal posterolateral branch.  A St. Jude Medical QuickFlex Micro model [phone] - [id number] (serial number  [id number]) lead was advanced through the MB-2 into the distal posterolateral branch.   This was  approximately one-thirds from the base to the apex in a lateral position.  In this location, the left ventricular lead R-waves measured  10 mV with impedance of 671 ohms and a  threshold of  0.6 volt at 0.4  milliseconds in the bipolar configuration.  Diaphragmatic stimulation was observed with pacing at 4 volts and above.  No diaphragmatic  Stimulation was observed when pacing at 3 volts output or below.  The MB-2 guide was  therefore removed.     All three leads were secured to the pectoralis  fascia using #2 silk suture over the suture sleeves.  The pocket then  irrigated with copious gentamicin solution.  The leads were then  connected to a St. Jude Medical Anthem RF Model [phone] (serial  Number [id number]) biventricular pacemaker.  The pacemaker was placed into the  pocket.  The pocket was then closed in 2 layers with 2.0 Vicryl suture  for the subcutaneous and subcuticular layers.  Steri-Strips and a  sterile dressing were then applied.  There were no early apparent complications.     CONCLUSIONS:   1. Ischemic cardiomyopathy with Left bundle-branch block and chronic New York Heart Association class III heart failure.   2. Successful biventricular pacemaker implantation (CRT-P).   3. No early apparent complications.   Hillis Range, MD 04/16/2011 1:11 PM

## 2011-04-17 ENCOUNTER — Encounter (HOSPITAL_COMMUNITY): Payer: Self-pay

## 2011-04-17 ENCOUNTER — Ambulatory Visit (HOSPITAL_COMMUNITY): Payer: Medicare Other

## 2011-04-17 ENCOUNTER — Other Ambulatory Visit: Payer: Self-pay

## 2011-04-17 NOTE — Discharge Summary (Signed)
Physician Discharge Summary  Patient ID: Anne [name],  MRN: [id number], DOB/AGE: 07/08/1927 77 y.o.  Admit date: 04/16/2011 Discharge date: 04/17/2011  Primary Discharge Diagnosis:  Non-ischemic cardiomyopathy with LBBB and NYHA class III heart failure status post cardiac resychronization therapy pacemaker implant this admission  Secondary Discharge Diagnosis:  1. CLL (chronic lymphocytic leukemia)- stable 2. Anemia 3. Diabetes 4. Degenerative joint disease 5. Hypertension 6. Coronary artery disease s/p CABG  PROCEDURES THIS ADMISSION: CRT-P implantation on 04-16-11 by Dr. Johney Frame.  The patient received a [employer] house [employer] model number 3210 device with model number [phone] right atrial and right ventricular lead and model number 1258T left ventricular lead.  The patient had no early apparent complications.   Hospital Course: The patient was admitted on 04-16-11 for planned CRT-P implantation.  This was carried out by Dr. Johney Frame with details as outlined above.  She was monitored on telemetry overnight which demonstrated sinus rhythm with CRT pacing.  Her left chest was without hematoma or ecchymosis.  CXR demonstrated no pneumothorax status post device implant.  Her device was interrogated and found to be functioning normally.  Dr. Johney Frame examined the patient on 04-17-11 and considered her stable for discharge to home.  Discharge Vitals: Blood pressure 142/88, pulse 85, temperature 98.2 F (36.8 C), temperature source Oral, resp. rate 20, height 5\' 5"  (1.651 m), weight 143 lb 9.6 oz (65.137 kg), SpO2 95.00%.      Discharge Medications:  Current Discharge Medication List    CONTINUE these medications which have NOT CHANGED   Details  acetaminophen (TYLENOL) 325 MG tablet Take 650 mg by mouth every 6 (six) hours as needed. For pain     aspirin 81 MG EC tablet Take 81 mg by mouth daily.     carvedilol (COREG) 6.25 MG tablet Take 9.375 mg by mouth 2 (two) times daily with a meal.       citalopram (CELEXA) 10 MG tablet Take 10 mg by mouth 2 (two) times daily.     Coenzyme Q10 (CO Q-10 PO) Take 1 tablet by mouth daily. CO-Q 10 100mg     DIOVAN 160 MG tablet Take 160 mg by mouth 2 (two) times daily. 1 160mg  tablet each morning and 1/2 tablet at night    !! Docusate Calcium (STOOL SOFTENER PO) Take 2 capsules by mouth daily.     !! docusate sodium (COLACE) 100 MG capsule Take 200 mg by mouth daily. Two capsules at night     furosemide (LASIX) 20 MG tablet Take 20 mg by mouth daily.      glimepiride (AMARYL) 4 MG tablet Take 4 mg by mouth at bedtime.     KLOR-CON M20 20 MEQ tablet Take 20 mEq by mouth daily.     metFORMIN (GLUCOPHAGE) 1000 MG tablet Take 1,000 mg by mouth 2 (two) times daily with a meal. Take 1 tablet in the morning and 1/2 a tablet in the evening     Multiple Vitamins-Minerals (CENTRUM SILVER PO) Take 1 tablet by mouth daily.     NAMENDA 10 MG tablet Take 10 mg by mouth 2 (two) times daily.     polysaccharide iron (NIFEREX) 150 MG CAPS capsule Take 150 mg by mouth every Monday, Wednesday, and Friday.     pravastatin (PRAVACHOL) 40 MG tablet Take 40 mg by mouth Daily.    TRADJENTA 5 MG TABS tablet Take 2.5 mg by mouth daily. Take one-half tablet at night    traMADol (ULTRAM) 50 MG tablet Take 50 mg  by mouth daily.     HYDROcodone-acetaminophen (VICODIN) 5-500 MG per tablet Take 1 tablet by mouth at bedtime. For pain     LORazepam (ATIVAN) 0.5 MG tablet Take 0.5 mg by mouth every 8 (eight) hours as needed. FOR NERVES     !! - Potential duplicate medications found. Please discuss with provider.      Disposition:  Discharge Orders    Future Appointments: Provider: Department: Dept Phone: Center:   04/23/2011 9:50 AM [facility] [phone] None   04/23/2011 11:30 AM [facility] [phone] None   04/26/2011 4:30 PM Vella Kohler [facility] [phone] [facility]   06/22/2011 9:00 AM  [facility] [phone] None   07/25/2011 4:15 PM Gardiner Rhyme, MD [facility] [phone] [facility]   09/05/2011 9:30 AM Dellis Anes, PA [facility] [phone] None     Future Orders Please Complete By Expires   Diet - low sodium heart healthy      Increase activity slowly        Follow-up Information    Follow up with Hillis Range, MD. (November 29th at 4:30pm)    Contact information:   [address] [place], [address] [place] [address] [phone]          Duration of Discharge Encounter: Greater than 30 minutes including physician time.  Signed, Gypsy Balsam 04/17/2011, 10:42 AM   I have seen, examined the patient, and reviewed the above discharge assessment and plan.   Co Sign: Hillis Range, MD 04/17/2011 11:02 AM

## 2011-04-17 NOTE — Progress Notes (Signed)
Patient Name: Anne [name] Date of Encounter: 04-17-11    SUBJECTIVE:Pt s/p CRT-P (STJ) implant on 04-16-11 with Dr. Johney Frame.  Feels well this morning.  No chest pain or shortness of breath.  No incisional pain.  TELEMETRY: Reviewed telemetry- sinus with BiV pacing, no arrhythmias  Filed Vitals:   04/16/11 2007 04/17/11 0022 04/17/11 0100 04/17/11 0500  BP: 132/75 154/75  147/81  Pulse: 91 103 92 87  Temp: 98.7 F (37.1 C) 97.8 F (36.6 C)  97.2 F (36.2 C)  TempSrc: Oral Oral  Oral  Resp: 20 16  22   Height:      Weight:      SpO2: 94% 91% 88% 99%    Intake/Output Summary (Last 24 hours) at 04/17/11 0749 Last data filed at 04/17/11 0531  Gross per 24 hour  Intake    660 ml  Output   1425 ml  Net   -765 ml   GEN- The patient is well appearing, alert and oriented x 3 today.   Head- normocephalic, atraumatic Ears- hearing intact Oropharynx- clear Neck-  no JVP Chest- pacemaker pocket is without hematoma Heart- Regular rate and rhythm, Extremities- no clubbing, cyanosis, or edema  Radiology/Studies:  Final result pending, leads in stable position.  No obvious ptx  DEVICE INTERROGATION: Device interrogated by industry.  Lead values including impedence, sensing, threshold within normal values.     A/P  Doing well s/p BiV pacemaker  Routine wound care and follow-up Resume home medications DC to home.  Hillis Range, MD 04/17/2011 8:15 AM

## 2011-04-17 NOTE — Progress Notes (Signed)
Pt CBG this AM was 226.  Dr. Johney Frame notified in person, and I was told to give Tradjenta and Amaryl doses for 0800.  Order was read back to MD to verify.

## 2011-04-23 ENCOUNTER — Other Ambulatory Visit (HOSPITAL_COMMUNITY): Payer: Self-pay | Admitting: Oncology

## 2011-04-23 ENCOUNTER — Encounter (HOSPITAL_BASED_OUTPATIENT_CLINIC_OR_DEPARTMENT_OTHER): Payer: Medicare Other

## 2011-04-23 DIAGNOSIS — D638 Anemia in other chronic diseases classified elsewhere: Secondary | ICD-10-CM

## 2011-04-23 LAB — DIFFERENTIAL
Basophils Absolute: 0.1 10*3/uL (ref 0.0–0.1)
Basophils Relative: 1 % (ref 0–1)
Eosinophils Absolute: 0.1 10*3/uL (ref 0.0–0.7)
Eosinophils Relative: 1 % (ref 0–5)
Lymphocytes Relative: 70 % — ABNORMAL HIGH (ref 12–46)
Lymphs Abs: 7.6 10*3/uL — ABNORMAL HIGH (ref 0.7–4.0)
Monocytes Absolute: 0.5 10*3/uL (ref 0.1–1.0)
Monocytes Relative: 4 % (ref 3–12)
Neutro Abs: 2.7 10*3/uL (ref 1.7–7.7)
Neutrophils Relative %: 24 % — ABNORMAL LOW (ref 43–77)

## 2011-04-23 LAB — CBC
HCT: 33.5 % — ABNORMAL LOW (ref 36.0–46.0)
MCH: 26.8 pg (ref 26.0–34.0)
MCHC: 30.7 g/dL (ref 30.0–36.0)
MCV: 87 fL (ref 78.0–100.0)
Platelets: 219 10*3/uL (ref 150–400)
RDW: 16.1 % — ABNORMAL HIGH (ref 11.5–15.5)
WBC: 10.9 10*3/uL — ABNORMAL HIGH (ref 4.0–10.5)

## 2011-04-23 MED ORDER — EPOETIN ALFA 40000 UNIT/ML IJ SOLN
INTRAMUSCULAR | Status: AC
  Filled 2011-04-23: qty 1

## 2011-04-23 MED ORDER — EPOETIN ALFA 20000 UNIT/ML IJ SOLN: 40000.0000 [IU] | Freq: Once | INTRAMUSCULAR | Status: DC

## 2011-04-23 MED ORDER — EPOETIN ALFA 40000 UNIT/ML IJ SOLN
40000.0000 [IU] | Freq: Once | INTRAMUSCULAR | Status: AC
  Administered 2011-04-23: 40000 [IU] via SUBCUTANEOUS

## 2011-04-23 NOTE — Progress Notes (Signed)
Anne [name] presents today for injection per MD orders. Procrit 40,000 units administered SQ in left Abdomen. Administration without incident. Patient tolerated well.  

## 2011-04-23 NOTE — Progress Notes (Signed)
Labs drawn today for cbc/diff 

## 2011-04-26 ENCOUNTER — Ambulatory Visit: Payer: Medicare Other | Admitting: *Deleted

## 2011-04-26 ENCOUNTER — Encounter: Payer: Self-pay | Admitting: Internal Medicine

## 2011-04-26 ENCOUNTER — Ambulatory Visit (INDEPENDENT_AMBULATORY_CARE_PROVIDER_SITE_OTHER): Payer: Medicare Other | Admitting: *Deleted

## 2011-04-26 DIAGNOSIS — I255 Ischemic cardiomyopathy: Secondary | ICD-10-CM

## 2011-04-26 DIAGNOSIS — I519 Heart disease, unspecified: Secondary | ICD-10-CM

## 2011-04-26 DIAGNOSIS — I2589 Other forms of chronic ischemic heart disease: Secondary | ICD-10-CM

## 2011-04-26 LAB — PACEMAKER DEVICE OBSERVATION
AL IMPEDENCE PM: 362.5 Ohm
ATRIAL PACING PM: 1
BAMS-0001: 150 {beats}/min
BAMS-0003: 70 {beats}/min
BATTERY VOLTAGE: 3.0982 V
DEVICE MODEL PM: 2707573
RV LEAD AMPLITUDE: 11.5 mv
RV LEAD IMPEDENCE PM: 487.5 Ohm

## 2011-04-26 NOTE — Progress Notes (Signed)
Wound check-PPM 

## 2011-05-07 ENCOUNTER — Encounter (HOSPITAL_COMMUNITY): Payer: Medicare Other | Attending: Oncology

## 2011-05-07 ENCOUNTER — Encounter (HOSPITAL_BASED_OUTPATIENT_CLINIC_OR_DEPARTMENT_OTHER): Payer: Medicare Other

## 2011-05-07 DIAGNOSIS — D638 Anemia in other chronic diseases classified elsewhere: Secondary | ICD-10-CM | POA: Insufficient documentation

## 2011-05-07 LAB — CBC
HCT: 34.9 % — ABNORMAL LOW (ref 36.0–46.0)
MCH: 27.3 pg (ref 26.0–34.0)
MCHC: 31.2 g/dL (ref 30.0–36.0)
MCV: 87.5 fL (ref 78.0–100.0)
Platelets: 192 10*3/uL (ref 150–400)
RDW: 17.3 % — ABNORMAL HIGH (ref 11.5–15.5)

## 2011-05-07 MED ORDER — EPOETIN ALFA 40000 UNIT/ML IJ SOLN
40000.0000 [IU] | Freq: Once | INTRAMUSCULAR | Status: AC
  Administered 2011-05-07: 40000 [IU] via SUBCUTANEOUS

## 2011-05-07 MED ORDER — EPOETIN ALFA 40000 UNIT/ML IJ SOLN
INTRAMUSCULAR | Status: AC
  Filled 2011-05-07: qty 1

## 2011-05-07 NOTE — Progress Notes (Signed)
Labs drawn today for cbc 

## 2011-05-07 NOTE — Progress Notes (Signed)
Tolerated injection well. 

## 2011-05-23 ENCOUNTER — Encounter (HOSPITAL_BASED_OUTPATIENT_CLINIC_OR_DEPARTMENT_OTHER): Payer: Medicare Other

## 2011-05-23 DIAGNOSIS — D638 Anemia in other chronic diseases classified elsewhere: Secondary | ICD-10-CM

## 2011-05-23 LAB — CBC
Hemoglobin: 11.5 g/dL — ABNORMAL LOW (ref 12.0–15.0)
MCH: 26.4 pg (ref 26.0–34.0)
Platelets: 209 10*3/uL (ref 150–400)
RBC: 4.35 MIL/uL (ref 3.87–5.11)
WBC: 10.6 10*3/uL — ABNORMAL HIGH (ref 4.0–10.5)

## 2011-05-23 NOTE — Progress Notes (Signed)
Anne [name] presented for [employer]. Labs per MD order drawn via Peripheral Line 25 gauge needle inserted in rt arm per M. Deatherage RN  Good blood return present. Procedure without incident.  Needle removed intact. Patient tolerated procedure well.  Procrit held due to hgb >11

## 2011-06-06 ENCOUNTER — Encounter (HOSPITAL_COMMUNITY): Payer: Medicare Other | Attending: Oncology

## 2011-06-06 DIAGNOSIS — D638 Anemia in other chronic diseases classified elsewhere: Secondary | ICD-10-CM | POA: Insufficient documentation

## 2011-06-06 DIAGNOSIS — C9110 Chronic lymphocytic leukemia of B-cell type not having achieved remission: Secondary | ICD-10-CM

## 2011-06-06 DIAGNOSIS — E875 Hyperkalemia: Secondary | ICD-10-CM

## 2011-06-06 LAB — BASIC METABOLIC PANEL
BUN: 25 mg/dL — ABNORMAL HIGH (ref 6–23)
CO2: 28 mEq/L (ref 19–32)
Chloride: 105 mEq/L (ref 96–112)
Creatinine, Ser: 1.16 mg/dL — ABNORMAL HIGH (ref 0.50–1.10)
GFR calc Af Amer: 50 mL/min — ABNORMAL LOW (ref >90)
Potassium: 4.7 mEq/L (ref 3.5–5.1)

## 2011-06-06 LAB — DIFFERENTIAL
Eosinophils Relative: 1 % (ref 0–5)
Lymphocytes Relative: 77 % — ABNORMAL HIGH (ref 12–46)
Lymphs Abs: 8.3 10*3/uL — ABNORMAL HIGH (ref 0.7–4.0)
Monocytes Absolute: 0.2 10*3/uL (ref 0.1–1.0)
Monocytes Relative: 2 % — ABNORMAL LOW (ref 3–12)
Neutro Abs: 2.1 10*3/uL (ref 1.7–7.7)

## 2011-06-06 LAB — IRON AND TIBC
Saturation Ratios: 18 % — ABNORMAL LOW (ref 20–55)
TIBC: 338 ug/dL (ref 250–470)
UIBC: 278 ug/dL (ref 125–400)

## 2011-06-06 LAB — CBC
HCT: 34.7 % — ABNORMAL LOW (ref 36.0–46.0)
Hemoglobin: 10.8 g/dL — ABNORMAL LOW (ref 12.0–15.0)
MCV: 85.5 fL (ref 78.0–100.0)
RBC: 4.06 MIL/uL (ref 3.87–5.11)
WBC: 10.8 10*3/uL — ABNORMAL HIGH (ref 4.0–10.5)

## 2011-06-06 MED ORDER — EPOETIN ALFA 40000 UNIT/ML IJ SOLN
40000.0000 [IU] | Freq: Once | INTRAMUSCULAR | Status: AC
  Administered 2011-06-06: 40000 [IU] via SUBCUTANEOUS

## 2011-06-06 MED ORDER — EPOETIN ALFA 40000 UNIT/ML IJ SOLN
INTRAMUSCULAR | Status: AC
  Administered 2011-06-06: 40000 [IU] via SUBCUTANEOUS
  Filled 2011-06-06: qty 1

## 2011-06-06 NOTE — Progress Notes (Unsigned)
Tolerated inj well.

## 2011-06-22 ENCOUNTER — Encounter (HOSPITAL_BASED_OUTPATIENT_CLINIC_OR_DEPARTMENT_OTHER): Payer: Medicare Other

## 2011-06-22 DIAGNOSIS — D638 Anemia in other chronic diseases classified elsewhere: Secondary | ICD-10-CM

## 2011-06-22 DIAGNOSIS — C9110 Chronic lymphocytic leukemia of B-cell type not having achieved remission: Secondary | ICD-10-CM

## 2011-06-22 LAB — CBC
HCT: 36.8 % (ref 36.0–46.0)
Hemoglobin: 11.4 g/dL — ABNORMAL LOW (ref 12.0–15.0)
MCH: 26.7 pg (ref 26.0–34.0)
MCHC: 31 g/dL (ref 30.0–36.0)
RDW: 18.3 % — ABNORMAL HIGH (ref 11.5–15.5)

## 2011-06-22 NOTE — Progress Notes (Signed)
Labs drawn today for cbc 

## 2011-07-06 ENCOUNTER — Encounter (HOSPITAL_BASED_OUTPATIENT_CLINIC_OR_DEPARTMENT_OTHER): Payer: Medicare Other

## 2011-07-06 ENCOUNTER — Encounter: Payer: Medicare Other | Admitting: Internal Medicine

## 2011-07-06 ENCOUNTER — Encounter (HOSPITAL_COMMUNITY): Payer: Medicare Other | Attending: Oncology

## 2011-07-06 DIAGNOSIS — C9110 Chronic lymphocytic leukemia of B-cell type not having achieved remission: Secondary | ICD-10-CM

## 2011-07-06 DIAGNOSIS — D638 Anemia in other chronic diseases classified elsewhere: Secondary | ICD-10-CM

## 2011-07-06 LAB — CBC
MCH: 26.5 pg (ref 26.0–34.0)
MCHC: 30.1 g/dL (ref 30.0–36.0)
MCV: 88 fL (ref 78.0–100.0)
Platelets: 200 10*3/uL (ref 150–400)
RDW: 17.6 % — ABNORMAL HIGH (ref 11.5–15.5)

## 2011-07-06 MED ORDER — EPOETIN ALFA 40000 UNIT/ML IJ SOLN
INTRAMUSCULAR | Status: AC
  Administered 2011-07-06: 40000 [IU] via SUBCUTANEOUS
  Filled 2011-07-06: qty 1

## 2011-07-06 MED ORDER — EPOETIN ALFA 20000 UNIT/ML IJ SOLN: 40000.0000 [IU] | Freq: Once | INTRAMUSCULAR | Status: DC

## 2011-07-06 NOTE — Progress Notes (Signed)
Anne [name] presents today for injection per MD orders. Procrit 16109 units administered SQ in right Abdomen. Administration without incident. Patient tolerated well.

## 2011-07-06 NOTE — Progress Notes (Signed)
Labs drawn today for cbc 

## 2011-07-19 ENCOUNTER — Encounter (HOSPITAL_BASED_OUTPATIENT_CLINIC_OR_DEPARTMENT_OTHER): Payer: Medicare Other

## 2011-07-19 DIAGNOSIS — C9110 Chronic lymphocytic leukemia of B-cell type not having achieved remission: Secondary | ICD-10-CM

## 2011-07-19 LAB — CBC
MCHC: 31.2 g/dL (ref 30.0–36.0)
Platelets: 196 10*3/uL (ref 150–400)
RDW: 18.2 % — ABNORMAL HIGH (ref 11.5–15.5)
WBC: 11.5 10*3/uL — ABNORMAL HIGH (ref 4.0–10.5)

## 2011-07-19 NOTE — Progress Notes (Signed)
Labs drawn today for cbc 

## 2011-07-25 ENCOUNTER — Encounter: Payer: Medicare Other | Admitting: Internal Medicine

## 2011-08-02 ENCOUNTER — Encounter: Payer: Self-pay | Admitting: Internal Medicine

## 2011-08-02 ENCOUNTER — Ambulatory Visit (INDEPENDENT_AMBULATORY_CARE_PROVIDER_SITE_OTHER): Payer: Medicare Other | Admitting: Internal Medicine

## 2011-08-02 ENCOUNTER — Encounter (HOSPITAL_COMMUNITY): Payer: Medicare Other | Attending: Oncology

## 2011-08-02 VITALS — BP 151/80 | HR 88 | Resp 18 | Ht 65.0 in | Wt 146.0 lb

## 2011-08-02 DIAGNOSIS — I255 Ischemic cardiomyopathy: Secondary | ICD-10-CM

## 2011-08-02 DIAGNOSIS — C9110 Chronic lymphocytic leukemia of B-cell type not having achieved remission: Secondary | ICD-10-CM | POA: Insufficient documentation

## 2011-08-02 DIAGNOSIS — I10 Essential (primary) hypertension: Secondary | ICD-10-CM

## 2011-08-02 DIAGNOSIS — I519 Heart disease, unspecified: Secondary | ICD-10-CM

## 2011-08-02 DIAGNOSIS — I2589 Other forms of chronic ischemic heart disease: Secondary | ICD-10-CM

## 2011-08-02 DIAGNOSIS — D638 Anemia in other chronic diseases classified elsewhere: Secondary | ICD-10-CM | POA: Insufficient documentation

## 2011-08-02 LAB — DIFFERENTIAL
Basophils Absolute: 0 10*3/uL (ref 0.0–0.1)
Lymphocytes Relative: 76 % — ABNORMAL HIGH (ref 12–46)
Lymphs Abs: 8.8 10*3/uL — ABNORMAL HIGH (ref 0.7–4.0)
Monocytes Absolute: 0.2 10*3/uL (ref 0.1–1.0)
Neutro Abs: 2.5 10*3/uL (ref 1.7–7.7)

## 2011-08-02 LAB — CBC
HCT: 34.7 % — ABNORMAL LOW (ref 36.0–46.0)
Platelets: 183 10*3/uL (ref 150–400)
RBC: 3.91 MIL/uL (ref 3.87–5.11)
RDW: 17.3 % — ABNORMAL HIGH (ref 11.5–15.5)
WBC: 11.6 10*3/uL — ABNORMAL HIGH (ref 4.0–10.5)

## 2011-08-02 NOTE — Progress Notes (Signed)
PCP:  [name], MD, MD Primary Cardiologist:  Dr Rennis Golden  The patient presents today for routine electrophysiology followup.  Since her recent BIV pacemaker implantation, the patient reports doing very well.  Her energy has improved.  She suspects that her exercise tolerance is also improved, though she has not been very active due to the cold weather and her continuous care for her debilitated husband. Today, she denies symptoms of palpitations, chest pain, shortness of breath, orthopnea, PND, lower extremity edema, dizziness, presyncope, syncope, or neurologic sequela.  She is pleased with results of her CRT-P implant. The patient feels that she is tolerating medications without difficulties and is otherwise without complaint today.   Past Medical History  Diagnosis Date  . CLL (chronic lymphocytic leukemia)     stable  . Anemia   . DM (diabetes mellitus)   . Hypercholesteremia   . DJD (degenerative joint disease) of knee     bilat  . Hypertension   . Coronary artery disease     three-vessel CAD with CABG -- x5 -- left internal mammary artery to LAD, sequential saphenous vein graft to first diagonal and first obtuse marginal, sequential saphenous vein graft to posterior descending and obtuse marginal 2 (posterolateral), endoscopic vein harvest of righ leg  . Shortness of breath   . CHF (congestive heart failure)     [place] Class 3 Heart Failure  . Blood transfusion    Past Surgical History  Procedure Date  . Coronary artery bypass graft 06/22/2009    x5 -- left internal mammary artery to LAD, sequential saphenous vein graft to first diagonal and first obtuse marginal, sequential saphenous vein graft to posterior descending and obtuse marginal 2 (posterolateral), endoscopic vein harvest of righ leg  . Goiter resection   . Thyroidectomy 1970s  . Cataract extraction, bilateral   . Rectal abscess   . Replacement total knee bilateral     bilateral  . Cardiac catheterization 01/18/2011   Est. EF of 25% to 30 - Mildly reduced cardiac output with no signs of decompensated heart failure and normal right heart filling pressures -- Patent bypass grafts with underlying three-vessel coronary artery disease  . Cardiac catheterization 06/14/2009    Est. EF of of 45% - Multivessel coronary artery disease with high-grade lesions in the LAD, left circumflex, and RCA -- Mildly reduced left ventricular function -- No significant aortic stenosis or mitral regurgitation  . Pacemaker insertion 04/16/2011    biventricular pacemaker insertion  . Eye surgery     Current Outpatient Prescriptions  Medication Sig Dispense Refill  . acetaminophen (TYLENOL) 325 MG tablet Take 650 mg by mouth every 6 (six) hours as needed. For pain       . aspirin 81 MG EC tablet Take 81 mg by mouth daily.       . carvedilol (COREG) 6.25 MG tablet Take 9.375 mg by mouth 2 (two) times daily with a meal.       . citalopram (CELEXA) 10 MG tablet Take 10 mg by mouth 2 (two) times daily.       . Coenzyme Q10 (CO Q-10 PO) Take 1 tablet by mouth daily. CO-Q 10 100mg       . DIOVAN 160 MG tablet Take 160 mg by mouth 2 (two) times daily. 1 160mg  tablet each morning and 1/2 tablet at night      . Docusate Calcium (STOOL SOFTENER PO) Take 2 capsules by mouth daily.       Marland Kitchen docusate sodium (COLACE) 100 MG  capsule Take 200 mg by mouth daily. Two capsules at night       . furosemide (LASIX) 20 MG tablet Take 20 mg by mouth daily.        Marland Kitchen glimepiride (AMARYL) 4 MG tablet Take 4 mg by mouth at bedtime.       Marland Kitchen HYDROcodone-acetaminophen (VICODIN) 5-500 MG per tablet Take 1 tablet by mouth at bedtime. For pain       . KLOR-CON M20 20 MEQ tablet Take 20 mEq by mouth daily.       Marland Kitchen LORazepam (ATIVAN) 0.5 MG tablet Take 0.5 mg by mouth every 8 (eight) hours as needed. FOR NERVES      . metFORMIN (GLUCOPHAGE) 1000 MG tablet Take 1,000 mg by mouth 2 (two) times daily with a meal. Take 1 tablet in the morning and 1/2 a tablet in the evening        . Multiple Vitamins-Minerals (CENTRUM SILVER PO) Take 1 tablet by mouth daily.       Marland Kitchen NAMENDA 10 MG tablet Take 10 mg by mouth 2 (two) times daily.       . polysaccharide iron (NIFEREX) 150 MG CAPS capsule Take 150 mg by mouth every Monday, Wednesday, and Friday.       . pravastatin (PRAVACHOL) 40 MG tablet Take 40 mg by mouth Daily.      . TRADJENTA 5 MG TABS tablet Take 5 mg by mouth daily. Take one-half tablet at night      . traMADol (ULTRAM) 50 MG tablet Take 50 mg by mouth daily.         Allergies  Allergen Reactions  . Lisinopril     REACTION: Dry Cough    History   Social History  . Marital Status: Married    Spouse Name: N/A    Number of Children: N/A  . Years of Education: N/A   Occupational History  . Not on file.   Social History Main Topics  . Smoking status: Never Smoker   . Smokeless tobacco: Never Used  . Alcohol Use: No  . Drug Use: No  . Sexually Active: No   Other Topics Concern  . Not on file   Social History Narrative   [employer] in [place].  Engineer, petroleum prior to retiring.    Physical Exam: Filed Vitals:   08/02/11 1652  BP: 151/80  Pulse: 88  Resp: 18  Height: 5\' 5"  (1.651 m)  Weight: 146 lb (66.225 kg)    GEN- The patient is elderly appearing, alert and oriented x 3 today.   Head- normocephalic, atraumatic Eyes-  Sclera clear, conjunctiva pink Ears- hearing intact Oropharynx- clear Neck- supple Lungs- Clear to ausculation bilaterally, normal work of breathing Chest- pacemaker pocket is well healed Heart- Regular rate and rhythm  GI- soft, NT, ND, + BS Extremities- no clubbing, cyanosis, or edema MS- age appropriate atrophy Skin- no rash or lesion Psych- euthymic mood, full affect Neuro- strength and sensation are intact  Pacemaker interrogation- reviewed in detail today,  See PACEART report  Assessment and Plan:

## 2011-08-02 NOTE — Assessment & Plan Note (Signed)
Normal biventricular pacemaker function See Pace Art report No changes today  I have informed her that she may have her pacemaker followed by Dr [facility] office and that I could see her as needed.

## 2011-08-02 NOTE — Assessment & Plan Note (Signed)
No ischemic symptoms CHF improved with CRT-P  Follow-up with Dr Rennis Golden.

## 2011-08-02 NOTE — Assessment & Plan Note (Signed)
Above goal today She will journal her blood pressure readings and follow-up with Drs Margo Aye and [facility]. If her BP remains elevated, I would recommend increasing coreg to 12.5mg  BID

## 2011-08-02 NOTE — Patient Instructions (Addendum)
Your physician wants you to follow-up in:Nov 2013 with Dr Jacquiline Doe will receive a reminder letter in the mail two months in advance. If you don't receive a letter, please call our office to schedule the follow-up appointment.

## 2011-08-15 ENCOUNTER — Other Ambulatory Visit (HOSPITAL_COMMUNITY): Payer: Self-pay | Admitting: Oncology

## 2011-08-15 DIAGNOSIS — C9110 Chronic lymphocytic leukemia of B-cell type not having achieved remission: Secondary | ICD-10-CM

## 2011-08-16 ENCOUNTER — Encounter (HOSPITAL_BASED_OUTPATIENT_CLINIC_OR_DEPARTMENT_OTHER): Payer: Medicare Other

## 2011-08-16 DIAGNOSIS — D638 Anemia in other chronic diseases classified elsewhere: Secondary | ICD-10-CM

## 2011-08-16 DIAGNOSIS — C9110 Chronic lymphocytic leukemia of B-cell type not having achieved remission: Secondary | ICD-10-CM

## 2011-08-16 LAB — CBC
MCH: 27.9 pg (ref 26.0–34.0)
MCHC: 31.6 g/dL (ref 30.0–36.0)
MCV: 88.5 fL (ref 78.0–100.0)
Platelets: 166 10*3/uL (ref 150–400)
RDW: 16.5 % — ABNORMAL HIGH (ref 11.5–15.5)

## 2011-08-16 LAB — COMPREHENSIVE METABOLIC PANEL
AST: 22 U/L (ref 0–37)
CO2: 27 mEq/L (ref 19–32)
Calcium: 10.1 mg/dL (ref 8.4–10.5)
Creatinine, Ser: 1.15 mg/dL — ABNORMAL HIGH (ref 0.50–1.10)
GFR calc non Af Amer: 43 mL/min — ABNORMAL LOW (ref >90)
Total Protein: 6.5 g/dL (ref 6.0–8.3)

## 2011-08-16 LAB — DIFFERENTIAL
Basophils Absolute: 0 10*3/uL (ref 0.0–0.1)
Basophils Relative: 0 % (ref 0–1)
Eosinophils Absolute: 0.1 10*3/uL (ref 0.0–0.7)
Eosinophils Relative: 1 % (ref 0–5)
Lymphs Abs: 10.2 10*3/uL — ABNORMAL HIGH (ref 0.7–4.0)

## 2011-08-16 MED ORDER — EPOETIN ALFA 40000 UNIT/ML IJ SOLN
40000.0000 [IU] | Freq: Once | INTRAMUSCULAR | Status: AC
  Administered 2011-08-16: 40000 [IU] via SUBCUTANEOUS

## 2011-08-16 MED ORDER — EPOETIN ALFA 40000 UNIT/ML IJ SOLN
INTRAMUSCULAR | Status: AC
  Administered 2011-08-16: 40000 [IU] via SUBCUTANEOUS
  Filled 2011-08-16: qty 1

## 2011-08-16 NOTE — Progress Notes (Signed)
Anne [name] presents today for injection per MD orders. Procrit 40,000 units administered SQ in right Abdomen. Administration without incident. Patient tolerated well.  

## 2011-08-30 ENCOUNTER — Encounter (HOSPITAL_COMMUNITY): Payer: Medicare Other | Attending: Oncology

## 2011-08-30 ENCOUNTER — Encounter (HOSPITAL_BASED_OUTPATIENT_CLINIC_OR_DEPARTMENT_OTHER): Payer: Medicare Other

## 2011-08-30 DIAGNOSIS — D638 Anemia in other chronic diseases classified elsewhere: Secondary | ICD-10-CM

## 2011-08-30 DIAGNOSIS — C9110 Chronic lymphocytic leukemia of B-cell type not having achieved remission: Secondary | ICD-10-CM

## 2011-08-30 LAB — CBC
Platelets: 178 10*3/uL (ref 150–400)
RDW: 17.3 % — ABNORMAL HIGH (ref 11.5–15.5)
WBC: 11.8 10*3/uL — ABNORMAL HIGH (ref 4.0–10.5)

## 2011-08-30 MED ORDER — EPOETIN ALFA 40000 UNIT/ML IJ SOLN
40000.0000 [IU] | Freq: Once | INTRAMUSCULAR | Status: AC
  Administered 2011-08-30: 40000 [IU] via SUBCUTANEOUS

## 2011-08-30 MED ORDER — EPOETIN ALFA 40000 UNIT/ML IJ SOLN
INTRAMUSCULAR | Status: AC
  Filled 2011-08-30: qty 1

## 2011-08-30 NOTE — Progress Notes (Signed)
Anne [name] presents today for injection per MD orders. Procrit 40,2000 units administered SQ in right Abdomen. Administration without incident. Patient tolerated well.

## 2011-08-31 NOTE — Progress Notes (Signed)
Lab draw

## 2011-09-05 ENCOUNTER — Ambulatory Visit (HOSPITAL_COMMUNITY): Payer: Medicare Other | Admitting: Oncology

## 2011-09-13 ENCOUNTER — Encounter: Payer: Self-pay | Admitting: Internal Medicine

## 2011-09-14 NOTE — Progress Notes (Signed)
Labs drawn

## 2011-09-17 ENCOUNTER — Emergency Department (HOSPITAL_COMMUNITY): Payer: Medicare Other

## 2011-09-17 ENCOUNTER — Encounter (HOSPITAL_COMMUNITY): Payer: Self-pay | Admitting: *Deleted

## 2011-09-17 ENCOUNTER — Emergency Department (HOSPITAL_COMMUNITY)
Admission: EM | Admit: 2011-09-17 | Discharge: 2011-09-17 | Disposition: A | Discharge status: Home / Self Care | Payer: Medicare Other | Attending: Emergency Medicine | Admitting: Emergency Medicine

## 2011-09-17 DIAGNOSIS — I509 Heart failure, unspecified: Secondary | ICD-10-CM | POA: Insufficient documentation

## 2011-09-17 DIAGNOSIS — I2510 Atherosclerotic heart disease of native coronary artery without angina pectoris: Secondary | ICD-10-CM | POA: Insufficient documentation

## 2011-09-17 DIAGNOSIS — M1990 Unspecified osteoarthritis, unspecified site: Secondary | ICD-10-CM | POA: Insufficient documentation

## 2011-09-17 DIAGNOSIS — C9110 Chronic lymphocytic leukemia of B-cell type not having achieved remission: Secondary | ICD-10-CM | POA: Insufficient documentation

## 2011-09-17 DIAGNOSIS — Z950 Presence of cardiac pacemaker: Secondary | ICD-10-CM | POA: Insufficient documentation

## 2011-09-17 DIAGNOSIS — M25559 Pain in unspecified hip: Secondary | ICD-10-CM | POA: Insufficient documentation

## 2011-09-17 DIAGNOSIS — Z951 Presence of aortocoronary bypass graft: Secondary | ICD-10-CM | POA: Insufficient documentation

## 2011-09-17 DIAGNOSIS — I10 Essential (primary) hypertension: Secondary | ICD-10-CM | POA: Insufficient documentation

## 2011-09-17 DIAGNOSIS — E119 Type 2 diabetes mellitus without complications: Secondary | ICD-10-CM | POA: Insufficient documentation

## 2011-09-17 DIAGNOSIS — E7800 Pure hypercholesterolemia, unspecified: Secondary | ICD-10-CM | POA: Insufficient documentation

## 2011-09-17 DIAGNOSIS — M25551 Pain in right hip: Secondary | ICD-10-CM

## 2011-09-17 MED ORDER — HYDROCODONE-ACETAMINOPHEN 5-325 MG PO TABS: 1.0000 | ORAL_TABLET | ORAL | Status: AC | PRN

## 2011-09-17 MED ORDER — HYDROCODONE-ACETAMINOPHEN 5-325 MG PO TABS
1.0000 | ORAL_TABLET | Freq: Once | ORAL | Status: AC
  Administered 2011-09-17: 1 via ORAL
  Filled 2011-09-17: qty 1

## 2011-09-17 NOTE — Discharge Instructions (Signed)
Hip Pain  The hips join the upper legs to the lower pelvis. The bones, cartilage, tendons, and muscles of the hip joint perform a lot of work each day holding your body weight and allowing you to move around.  Hip pain is a common symptom. It can range from a minor ache to severe pain on 1 or both hips. Pain may be felt on the inside of the hip joint near the groin, or the outside near the buttocks and upper thigh. There may be swelling or stiffness as well. It occurs more often when a person walks or performs activity. There are many reasons hip pain can develop.  CAUSES   It is important to work with your caregiver to identify the cause since many conditions can impact the bones, cartilage, muscles, and tendons of the hips. Causes for hip pain include:   Broken (fractured) bones.   Separation of the thighbone from the hip socket (dislocation).   Torn cartilage of the hip joint.   Swelling (inflammation) of a tendon (tendonitis), the sac within the hip joint (bursitis), or a joint.   A weakening in the abdominal wall (hernia), affecting the nerves to the hip.   Arthritis in the hip joint or lining of the hip joint.   Pinched nerves in the back, hip, or upper thigh.   A bulging disc in the spine (herniated disc).   Rarely, bone infection or cancer.  DIAGNOSIS   The location of your hip pain will help your caregiver understand what may be causing the pain. A diagnosis is based on your medical history, your symptoms, results from your physical exam, and results from diagnostic tests. Diagnostic tests may include X-ray exams, a computerized magnetic scan (magnetic resonance imaging, MRI), or bone scan.  TREATMENT   Treatment will depend on the cause of your hip pain. Treatment may include:   Limiting activities and resting until symptoms improve.   Crutches or other walking supports (a cane or brace).   Ice, elevation, and compression.   Physical therapy or home exercises.   Shoe inserts or special  shoes.   Losing weight.   Medications to reduce pain.   Undergoing surgery.  HOME CARE INSTRUCTIONS    Only take over-the-counter or prescription medicines for pain, discomfort, or fever as directed by your caregiver.   Put ice on the injured area:   Put ice in a plastic bag.   Place a towel between your skin and the bag.   Leave the ice on for 15 to 20 minutes at a time, 3 to 4 times a day.   Keep your leg raised (elevated) when possible to lessen swelling.   Avoid activities that cause pain.   Follow specific exercises as directed by your caregiver.   Sleep with a pillow between your legs on your most comfortable side.   Record how often you have hip pain, the location of the pain, and what it feels like. This information may be helpful to you and your caregiver.   Ask your caregiver about returning to work or sports and whether you should drive.   Follow up with your caregiver for further exams, therapy, or testing as directed.  SEEK MEDICAL CARE IF:    Your pain or swelling continues or worsens after 1 week.   You are feeling unwell or have chills.   You have increasing difficulty with walking.   You have a loss of sensation or other new symptoms.   You have questions   or concerns.  SEEK IMMEDIATE MEDICAL CARE IF:    You cannot put weight on the affected hip.   You have fallen.   You have a sudden increase in pain and swelling in your hip.   You have a fever.  MAKE SURE YOU:    Understand these instructions.   Will watch your condition.   Will get help right away if you are not doing well or get worse.  Document Released: 11/01/2009 Document Revised: 05/03/2011 Document Reviewed: 11/01/2009  [employer] Patient Information 2012 [employer].

## 2011-09-17 NOTE — ED Notes (Signed)
Rt hip pain since this am, No injury

## 2011-09-17 NOTE — ED Provider Notes (Signed)
History     CSN: [id number]  Arrival date & time 09/17/11  1556   First MD Initiated Contact with Patient 09/17/11 1854      Chief Complaint  Patient presents with  . Hip Pain    (Consider location/radiation/quality/duration/timing/severity/associated sxs/prior treatment) Patient is a 78 y.o. female presenting with hip pain. The history is provided by the patient.  Hip Pain This is a new problem. Pertinent negatives include no chest pain, no abdominal pain, no headaches and no shortness of breath.   patient woke with   pain in her right hip this morning. It is worse with movement. She states she's been able to walk. There is no trauma. No fevers. No numbness or weakness. No relief with her Ultram. No dysuria. She's not had pain like this before. There is no rash. No abdominal pain. Past Medical History  Diagnosis Date  . CLL (chronic lymphocytic leukemia)     stable  . Anemia   . DM (diabetes mellitus)   . Hypercholesteremia   . DJD (degenerative joint disease) of knee     bilat  . Hypertension   . Coronary artery disease     three-vessel CAD with CABG -- x5 -- left internal mammary artery to LAD, sequential saphenous vein graft to first diagonal and first obtuse marginal, sequential saphenous vein graft to posterior descending and obtuse marginal 2 (posterolateral), endoscopic vein harvest of righ leg  . Shortness of breath   . CHF (congestive heart failure)     [place] Class 3 Heart Failure  . Blood transfusion   . CLL (chronic lymphocytic leukemia)     Past Surgical History  Procedure Date  . Coronary artery bypass graft 06/22/2009    x5 -- left internal mammary artery to LAD, sequential saphenous vein graft to first diagonal and first obtuse marginal, sequential saphenous vein graft to posterior descending and obtuse marginal 2 (posterolateral), endoscopic vein harvest of righ leg  . Goiter resection   . Thyroidectomy 1970s  . Cataract extraction, bilateral   . Rectal  abscess   . Replacement total knee bilateral     bilateral  . Cardiac catheterization 01/18/2011    Est. EF of 25% to 30 - Mildly reduced cardiac output with no signs of decompensated heart failure and normal right heart filling pressures -- Patent bypass grafts with underlying three-vessel coronary artery disease  . Cardiac catheterization 06/14/2009    Est. EF of of 45% - Multivessel coronary artery disease with high-grade lesions in the LAD, left circumflex, and RCA -- Mildly reduced left ventricular function -- No significant aortic stenosis or mitral regurgitation  . Pacemaker insertion 04/16/2011    biventricular pacemaker insertion  . Eye surgery     History reviewed. No pertinent family history.  History  Substance Use Topics  . Smoking status: Never Smoker   . Smokeless tobacco: Never Used  . Alcohol Use: No    OB History    Grav Para Term Preterm Abortions TAB SAB Ect Mult Living                  Review of Systems  Constitutional: Negative for activity change and appetite change.  HENT: Negative for neck stiffness.   Eyes: Negative for pain.  Respiratory: Negative for chest tightness and shortness of breath.   Cardiovascular: Negative for chest pain and leg swelling.  Gastrointestinal: Negative for nausea, vomiting, abdominal pain and diarrhea.  Genitourinary: Negative for flank pain.  Musculoskeletal: Negative for myalgias, back pain  and joint swelling.  Skin: Negative for rash.  Neurological: Negative for weakness, numbness and headaches.  Psychiatric/Behavioral: Negative for behavioral problems.    Allergies  Lisinopril  Home Medications   Current Outpatient Rx  Name Route Sig Dispense Refill  . ACETAMINOPHEN 325 MG PO TABS Oral Take 650 mg by mouth every 6 (six) hours as needed. For pain     . ASPIRIN 81 MG PO TBEC Oral Take 81 mg by mouth daily.     Marland Kitchen CARVEDILOL 6.25 MG PO TABS Oral Take 9.375 mg by mouth 2 (two) times daily with a meal.     .  CITALOPRAM HYDROBROMIDE 10 MG PO TABS Oral Take 10 mg by mouth 2 (two) times daily.     . CO Q-10 PO Oral Take 1 tablet by mouth daily. CO-Q 10 100mg     . DIOVAN 160 MG PO TABS Oral Take 160 mg by mouth 2 (two) times daily. 1 160mg  tablet each morning and 1/2 tablet at night    . STOOL SOFTENER PO Oral Take 2 capsules by mouth daily.     Marland Kitchen DOCUSATE SODIUM 100 MG PO CAPS Oral Take 200 mg by mouth daily. Two capsules at night     . FUROSEMIDE 20 MG PO TABS Oral Take 20 mg by mouth daily.      Marland Kitchen GLIMEPIRIDE 4 MG PO TABS Oral Take 4 mg by mouth at bedtime.     Marland Kitchen HYDROCODONE-ACETAMINOPHEN 5-325 MG PO TABS Oral Take 1 tablet by mouth every 4 (four) hours as needed for pain. 10 tablet 0  . HYDROCODONE-ACETAMINOPHEN 5-500 MG PO TABS Oral Take 1 tablet by mouth at bedtime. For pain     . KLOR-CON M20 20 MEQ PO TBCR Oral Take 20 mEq by mouth daily.     Marland Kitchen LORAZEPAM 0.5 MG PO TABS Oral Take 0.5 mg by mouth every 8 (eight) hours as needed. FOR NERVES    . METFORMIN HCL 1000 MG PO TABS Oral Take 1,000 mg by mouth 2 (two) times daily with a meal. Take 1 tablet in the morning and 1/2 a tablet in the evening     . CENTRUM SILVER PO Oral Take 1 tablet by mouth daily.     Marland Kitchen NAMENDA 10 MG PO TABS Oral Take 10 mg by mouth 2 (two) times daily.     Marland Kitchen POLYSACCHARIDE IRON 150 MG PO CAPS Oral Take 150 mg by mouth every Monday, Wednesday, and Friday.     Marland Kitchen PRAVASTATIN SODIUM 40 MG PO TABS Oral Take 40 mg by mouth Daily.    . TRADJENTA 5 MG PO TABS Oral Take 5 mg by mouth daily. Take one-half tablet at night    . TRAMADOL HCL 50 MG PO TABS Oral Take 50 mg by mouth daily.       BP 132/57  Pulse 77  Temp(Src) 98 F (36.7 C) (Oral)  Ht 5\' 5"  (1.651 m)  Wt 140 lb (63.504 kg)  BMI 23.30 kg/m2  SpO2 99%  Physical Exam  Nursing note and vitals reviewed. Constitutional: She is oriented to person, place, and time. She appears well-developed and well-nourished.  HENT:  Head: Normocephalic and atraumatic.    Cardiovascular: Normal rate, regular rhythm and normal heart sounds.   No murmur heard. Pulmonary/Chest: Effort normal and breath sounds normal. No respiratory distress. She has no wheezes. She has no rales.  Abdominal: Soft. Bowel sounds are normal. She exhibits no distension. There is no tenderness. There is no rebound  and no guarding.  Musculoskeletal: Normal range of motion. She exhibits tenderness. She exhibits no edema.       Tenderness in the area of the SI joint on the right side. No rash. No mass. Range of motion intact right hip. No crepitance. Neurovascular intact distally.  Neurological: She is alert and oriented to person, place, and time. No cranial nerve deficit.  Skin: Skin is warm and dry.  Psychiatric: She has a normal mood and affect. Her speech is normal.    ED Course  Procedures (including critical care time)  Labs Reviewed - No data to display Dg Hip Complete Right  09/17/2011  *RADIOLOGY REPORT*  Clinical Data: Right hip pain.  RIGHT HIP - COMPLETE 2+ VIEW  Comparison: None  Findings: There are advanced hip joint degenerative changes, left greater than right.  No acute fracture or plain film evidence of avascular necrosis.  Pubic symphysis and SI joints are intact. Vascular calcifications are noted.  Advanced degenerative changes noted in the lower lumbar spine.  IMPRESSION:  1.  Bilateral hip joint degenerative changes, left greater than right. 2.  No acute bony findings.  Original Report Authenticated By: P. Loralie Champagne, M.D.     1. Right hip pain       MDM   right hip/pelvis pain. Tender in the area of the SI joint. Negative x-ray for fracture. There is some degenerative changes of the right hip. No fevers. No change in urinary symptoms with the tenderness I think it is likely musculoskeletal pain. Facial be started on hydrocodone. She be discharged home and followup as needed.        Juliet Rude. Rubin Payor, MD 09/17/11 [phone]

## 2011-09-18 ENCOUNTER — Telehealth: Payer: Self-pay | Admitting: Internal Medicine

## 2011-09-18 DIAGNOSIS — I509 Heart failure, unspecified: Secondary | ICD-10-CM

## 2011-09-18 NOTE — Telephone Encounter (Signed)
I know this patient and her daughter very well. I would like to review her echo. Please forward to me.

## 2011-09-18 NOTE — Telephone Encounter (Signed)
Spoke with daughter and her Mom had an echo on Thurs at Dr [employer] office.  They also wanted her to have a stress test because her SOB is no better.  She agreed to have the echo but would not agree to the stress test until after echo was done to see if she truly needed teh test.  Dr Hilt's office advised her daughter that Dr Johney Frame needed to review her echo as he follows her for her device.  I let the daughter know we have not received the echo as of yet. She was going to call and ask them to send it again.  I let her know he would be here tomorrow to look at study.

## 2011-09-18 NOTE — Telephone Encounter (Signed)
Please return call to patient daughter Meriam Sprague regarding ECHO(incoming from Dr. Rennis Golden) , she can be reached at [phone].

## 2011-09-19 NOTE — Telephone Encounter (Signed)
Dr Johney Frame reviewed echo and will set patient up for AV opt echo to optimize her device  Anne [name] is going to call her daughter and let her know

## 2011-09-19 NOTE — Telephone Encounter (Signed)
Follow-up:     Returned your phone call to say that she called again and to let her know if you have not received it today.

## 2011-09-19 NOTE — Telephone Encounter (Signed)
Called daughter and lmom that I still do not have echo from Dr [facility] office

## 2011-09-27 ENCOUNTER — Encounter (HOSPITAL_BASED_OUTPATIENT_CLINIC_OR_DEPARTMENT_OTHER): Payer: Medicare Other

## 2011-09-27 ENCOUNTER — Encounter (HOSPITAL_COMMUNITY): Payer: Self-pay | Admitting: Oncology

## 2011-09-27 ENCOUNTER — Encounter (HOSPITAL_COMMUNITY): Payer: Medicare Other | Attending: Oncology | Admitting: Oncology

## 2011-09-27 DIAGNOSIS — C9110 Chronic lymphocytic leukemia of B-cell type not having achieved remission: Secondary | ICD-10-CM

## 2011-09-27 DIAGNOSIS — D638 Anemia in other chronic diseases classified elsewhere: Secondary | ICD-10-CM | POA: Insufficient documentation

## 2011-09-27 DIAGNOSIS — I509 Heart failure, unspecified: Secondary | ICD-10-CM

## 2011-09-27 DIAGNOSIS — E119 Type 2 diabetes mellitus without complications: Secondary | ICD-10-CM

## 2011-09-27 LAB — DIFFERENTIAL
Eosinophils Absolute: 0.1 10*3/uL (ref 0.0–0.7)
Eosinophils Relative: 1 % (ref 0–5)
Lymphocytes Relative: 74 % — ABNORMAL HIGH (ref 12–46)
Lymphs Abs: 9.6 10*3/uL — ABNORMAL HIGH (ref 0.7–4.0)
Monocytes Absolute: 0.4 10*3/uL (ref 0.1–1.0)
Monocytes Relative: 3 % (ref 3–12)

## 2011-09-27 LAB — COMPREHENSIVE METABOLIC PANEL
AST: 27 U/L (ref 0–37)
BUN: 29 mg/dL — ABNORMAL HIGH (ref 6–23)
CO2: 27 mEq/L (ref 19–32)
Chloride: 96 mEq/L (ref 96–112)
Creatinine, Ser: 1.1 mg/dL (ref 0.50–1.10)
GFR calc Af Amer: 53 mL/min — ABNORMAL LOW (ref >90)
GFR calc non Af Amer: 46 mL/min — ABNORMAL LOW (ref >90)
Glucose, Bld: 304 mg/dL — ABNORMAL HIGH (ref 70–99)
Total Bilirubin: 0.4 mg/dL (ref 0.3–1.2)

## 2011-09-27 LAB — CBC
HCT: 34 % — ABNORMAL LOW (ref 36.0–46.0)
Hemoglobin: 10.8 g/dL — ABNORMAL LOW (ref 12.0–15.0)
MCH: 28.2 pg (ref 26.0–34.0)
MCV: 88.8 fL (ref 78.0–100.0)
RBC: 3.83 MIL/uL — ABNORMAL LOW (ref 3.87–5.11)
WBC: 13 10*3/uL — ABNORMAL HIGH (ref 4.0–10.5)

## 2011-09-27 MED ORDER — EPOETIN ALFA 40000 UNIT/ML IJ SOLN
INTRAMUSCULAR | Status: AC
  Filled 2011-09-27: qty 1

## 2011-09-27 MED ORDER — EPOETIN ALFA 20000 UNIT/ML IJ SOLN
40000.0000 [IU] | Freq: Once | INTRAMUSCULAR | Status: AC
  Administered 2011-09-27: 40000 [IU] via SUBCUTANEOUS

## 2011-09-27 NOTE — Progress Notes (Signed)
Lab today.

## 2011-09-27 NOTE — Progress Notes (Signed)
Anne [name]'s reason for visit today is for an injection and labs as scheduled per MD orders.  Labs were drawn prior to administration of ordered medication.  Venipuncture performed with a 23 gauge butterfly needle to R Antecubital.  Anne [name] also received Procrit per MD orders; see [facility] for administration details.  Anne [name] tolerated all procedures well and without incident; questions were answered and patient was discharged.  Lab Results  Component Value Date   HGB 10.8* 09/27/2011

## 2011-09-27 NOTE — Progress Notes (Signed)
Anne Melena, MD, MD [address] [place] [address]  1. Anemia of chronic disease  CBC, Differential, CBC, Differential  2. LEUKEMIA, LYMPHOCYTIC, CHRONIC      CURRENT THERAPY:Procrit 40,000 units every 14 days  INTERVAL HISTORY: Anne [name] 78 y.o. female returns for  regular  visit for followup of Stage 0 CLL and Anemia of chronic disease.  The patient is doing very well.  She had a visit to the ER for hip pain.  She reports that the day prior, she was performing a lot of weeding and yard work.  The next morning she noted some right hip pain and had difficulty ambulating as a result.  The patient's daughter took her to the ER.  In the ER, an x-ray was performed which revealed B/L hip joint degenerative changes, left is greater than right.  The patient patient was given some Vicodin and her pain has been well controlled.   Otherwise, the patient denies any B symptoms including fevers, chills, night sweats, loss of appetite, and weight loss.  She is very busy caring for her bed bound, demented husband.  Fortunately, the patient has an aid who helps her 3 hours daily in the AM and in the PM.  I personally reviewed and went over laboratory results with the patient.    Past Medical History  Diagnosis Date  . CLL (chronic lymphocytic leukemia)     stable  . Anemia   . DM (diabetes mellitus)   . Hypercholesteremia   . DJD (degenerative joint disease) of knee     bilat  . Hypertension   . Coronary artery disease     three-vessel CAD with CABG -- x5 -- left internal mammary artery to LAD, sequential saphenous vein graft to first diagonal and first obtuse marginal, sequential saphenous vein graft to posterior descending and obtuse marginal 2 (posterolateral), endoscopic vein harvest of righ leg  . Shortness of breath   . CHF (congestive heart failure)     [place] Class 3 Heart Failure  . Blood transfusion   . CLL (chronic lymphocytic leukemia)   . CLL (chronic lymphocytic leukemia)  04/03/2007    Stage 0 CLL      has CLL (chronic lymphocytic leukemia); HYPOTHYROIDISM; DIABETES MELLITUS, TYPE II, UNCONTROLLED; HYPERLIPIDEMIA; HYPERKALEMIA; Anemia of chronic disease; DEPRESSION; CATARACTS; HYPERTENSION; BRONCHITIS, ACUTE; ARTHRITIS; LOW BACK PAIN, CHRONIC; BURSITIS, ACROMIOCLAVICULAR, LEFT; MEMORY LOSS; FECAL INCONTINENCE; LACERATION, HAND, LEFT; Ischemic cardiomyopathy; and Chronic systolic dysfunction of left ventricle on her problem list.     is allergic to lisinopril.  Ms. Cavanaugh does not currently have medications on file.  Past Surgical History  Procedure Date  . Coronary artery bypass graft 06/22/2009    x5 -- left internal mammary artery to LAD, sequential saphenous vein graft to first diagonal and first obtuse marginal, sequential saphenous vein graft to posterior descending and obtuse marginal 2 (posterolateral), endoscopic vein harvest of righ leg  . Goiter resection   . Thyroidectomy 1970s  . Cataract extraction, bilateral   . Rectal abscess   . Replacement total knee bilateral     bilateral  . Cardiac catheterization 01/18/2011    Est. EF of 25% to 30 - Mildly reduced cardiac output with no signs of decompensated heart failure and normal right heart filling pressures -- Patent bypass grafts with underlying three-vessel coronary artery disease  . Cardiac catheterization 06/14/2009    Est. EF of of 45% - Multivessel coronary artery disease with high-grade lesions in the LAD, left circumflex, and  RCA -- Mildly reduced left ventricular function -- No significant aortic stenosis or mitral regurgitation  . Pacemaker insertion 04/16/2011    biventricular pacemaker insertion  . Eye surgery     Denies any headaches, dizziness, double vision, fevers, chills, night sweats, nausea, vomiting, diarrhea, constipation, chest pain, heart palpitations, shortness of breath, blood in stool, black tarry stool, urinary pain, urinary burning, urinary frequency, hematuria.     PHYSICAL EXAMINATION  ECOG PERFORMANCE STATUS: 1 - Symptomatic but completely ambulatory  Filed Vitals:   09/27/11 1022  BP: 144/77  Pulse: 83  Temp: 97.2 F (36.2 C)    GENERAL:alert, no distress, well nourished, well developed, comfortable, cooperative and smiling SKIN: skin color, texture, turgor are normal, no rashes or significant lesions HEAD: Normocephalic, No masses, lesions, tenderness or abnormalities EYES: normal, EOMI, Conjunctiva are pink and non-injected EARS: External ears normal OROPHARYNX:lips, buccal mucosa, and tongue normal and mucous membranes are moist  NECK: supple, trachea midline, B/L angle of the jaw lymph nodes measuring 1 cm. LYMPH:  no hepatosplenomegaly BREAST:not examined LUNGS: clear to auscultation and percussion HEART: regular rate & rhythm, no murmurs, no gallops, S1 normal and S2 normal ABDOMEN:abdomen soft, non-tender, normal bowel sounds, no masses or organomegaly and no hepatosplenomegaly BACK: Back symmetric, no curvature., No CVA tenderness EXTREMITIES:less then 2 second capillary refill, no joint deformities, effusion, or inflammation, no edema, no skin discoloration, no clubbing, no cyanosis  NEURO: alert & oriented x 3 with fluent speech, no focal motor/sensory deficits, gait normal with assistance of cane.   LABORATORY DATA: CBC    Component Value Date/Time   WBC 13.0* 09/27/2011 1026   RBC 3.83* 09/27/2011 1026   HGB 10.8* 09/27/2011 1026   HCT 34.0* 09/27/2011 1026   PLT 194 09/27/2011 1026   MCV 88.8 09/27/2011 1026   MCH 28.2 09/27/2011 1026   MCHC 31.8 09/27/2011 1026   RDW 15.5 09/27/2011 1026   LYMPHSABS 9.6* 09/27/2011 1026   MONOABS 0.4 09/27/2011 1026   EOSABS 0.1 09/27/2011 1026   BASOSABS 0.0 09/27/2011 1026     PENDING LABS: LDH, CMET   RADIOGRAPHIC STUDIES:  09/17/2011  *RADIOLOGY REPORT*  Clinical Data: Right hip pain.  RIGHT HIP - COMPLETE 2+ VIEW  Comparison: None  Findings: There are advanced hip joint degenerative  changes, left  greater than right. No acute fracture or plain film evidence of  avascular necrosis. Pubic symphysis and SI joints are intact.  Vascular calcifications are noted. Advanced degenerative changes  noted in the lower lumbar spine.  IMPRESSION:  1. Bilateral hip joint degenerative changes, left greater than  right.  2. No acute bony findings.  Original Report Authenticated By: P. Loralie Champagne, M.D.     ASSESSMENT:  1. Stage 0 CLL, not requiring any therapy  2. Anemia of chronic disease with a quick response to Procrit 40,000 units. This was held because her hemoglobin responded nicely and was 12 or higher. She is now 10.8 and we will therefore give her an injection today. We will hold when hemoglobin is 11 or greater.  3. DM, uncontrolled  4. HTN   PLAN:  1. I personally reviewed and went over laboratory results with the patient. 2. I personally reviewed and went over radiographic studies with the patient. 3. Encouraged the patient to take her Vicodin for discomfort as needed only.  4. Continue lab work with each Procrit injection: CBC diff. 5. Lab work in 3 months: CBC diff, CMET, LDH 6. Return in 3 months for  follow-up.   All questions were answered. The patient knows to call the clinic with any problems, questions or concerns. We can certainly see the patient much sooner if necessary.  Renaud Celli

## 2011-09-27 NOTE — Patient Instructions (Signed)
Anne [name]  [id number] 07/01/1927 Dr. Glenford Peers   [facility]  Discharge Instructions  RECOMMENDATIONS MADE BY THE CONSULTANT AND ANY TEST RESULTS WILL BE SENT TO YOUR REFERRING DOCTOR.   EXAM FINDINGS BY MD TODAY AND SIGNS AND SYMPTOMS TO REPORT TO CLINIC OR PRIMARY MD: Exam findings as discussed by T. Kefalas, PA-C.  SPECIAL INSTRUCTIONS/FOLLOW-UP: 1.  Continue to come for labs and possible procrit injections every 2 weeks as discussed. 2.  Return in 3 months to see Dr. Mariel Sleet.   I acknowledge that I have been informed and understand all the instructions given to me and received a copy. I do not have any more questions at this time, but understand that I may call the Specialty Clinic at [facility] at [phone] during business hours should I have any further questions or need assistance in obtaining follow-up care.    __________________________________________  _____________  __________ Signature of Patient or Authorized Representative            Date                   Time    __________________________________________ Nurse's Signature

## 2011-10-10 ENCOUNTER — Encounter: Payer: Self-pay | Admitting: Internal Medicine

## 2011-10-10 ENCOUNTER — Ambulatory Visit (HOSPITAL_COMMUNITY): Payer: Medicare Other | Attending: Cardiology

## 2011-10-10 DIAGNOSIS — I509 Heart failure, unspecified: Secondary | ICD-10-CM

## 2011-10-10 DIAGNOSIS — I10 Essential (primary) hypertension: Secondary | ICD-10-CM | POA: Insufficient documentation

## 2011-10-10 DIAGNOSIS — I2510 Atherosclerotic heart disease of native coronary artery without angina pectoris: Secondary | ICD-10-CM | POA: Insufficient documentation

## 2011-10-10 DIAGNOSIS — E119 Type 2 diabetes mellitus without complications: Secondary | ICD-10-CM | POA: Insufficient documentation

## 2011-10-10 DIAGNOSIS — Z951 Presence of aortocoronary bypass graft: Secondary | ICD-10-CM | POA: Insufficient documentation

## 2011-10-11 ENCOUNTER — Encounter (HOSPITAL_BASED_OUTPATIENT_CLINIC_OR_DEPARTMENT_OTHER): Payer: Medicare Other

## 2011-10-11 DIAGNOSIS — C9110 Chronic lymphocytic leukemia of B-cell type not having achieved remission: Secondary | ICD-10-CM

## 2011-10-11 DIAGNOSIS — D638 Anemia in other chronic diseases classified elsewhere: Secondary | ICD-10-CM

## 2011-10-11 LAB — CBC
MCV: 88.7 fL (ref 78.0–100.0)
Platelets: 196 10*3/uL (ref 150–400)
RDW: 16.2 % — ABNORMAL HIGH (ref 11.5–15.5)
WBC: 12.5 10*3/uL — ABNORMAL HIGH (ref 4.0–10.5)

## 2011-10-11 NOTE — Progress Notes (Signed)
Anne [name] presented for labs and possible procrit injection.  Venipuncture performed by A. Nance.  Today's procrit was not administered.  Lab Results  Component Value Date   HGB 11.5* 10/11/2011

## 2011-10-25 ENCOUNTER — Encounter (HOSPITAL_BASED_OUTPATIENT_CLINIC_OR_DEPARTMENT_OTHER): Payer: Medicare Other

## 2011-10-25 DIAGNOSIS — C9200 Acute myeloblastic leukemia, not having achieved remission: Secondary | ICD-10-CM

## 2011-10-25 LAB — CBC
Hemoglobin: 10.8 g/dL — ABNORMAL LOW (ref 12.0–15.0)
MCHC: 32 g/dL (ref 30.0–36.0)
RDW: 15.8 % — ABNORMAL HIGH (ref 11.5–15.5)
WBC: 15 10*3/uL — ABNORMAL HIGH (ref 4.0–10.5)

## 2011-10-25 MED ORDER — EPOETIN ALFA 20000 UNIT/ML IJ SOLN: 40000.0000 [IU] | Freq: Once | INTRAMUSCULAR | Status: DC

## 2011-10-25 NOTE — Progress Notes (Signed)
Anne [name]'s reason for visit today are for labs as scheduled per MD orders.  Venipuncture performed with a 23 gauge butterfly needle to R Antecubital.  Anne [name] tolerated venipuncture well and without incident.  Procrit was not administered; questions were answered and patient was discharged.   Lab Results  Component Value Date   HGB 10.8* 10/25/2011

## 2011-10-30 ENCOUNTER — Other Ambulatory Visit (HOSPITAL_COMMUNITY): Payer: Self-pay | Admitting: Oncology

## 2011-10-30 ENCOUNTER — Encounter (HOSPITAL_COMMUNITY): Payer: Self-pay | Admitting: Oncology

## 2011-10-30 DIAGNOSIS — D631 Anemia in chronic kidney disease: Secondary | ICD-10-CM

## 2011-10-30 DIAGNOSIS — D638 Anemia in other chronic diseases classified elsewhere: Secondary | ICD-10-CM

## 2011-10-30 DIAGNOSIS — N183 Chronic kidney disease, stage 3 unspecified: Secondary | ICD-10-CM

## 2011-10-30 HISTORY — DX: Anemia in other chronic diseases classified elsewhere: D63.8

## 2011-10-30 HISTORY — DX: Anemia in chronic kidney disease: D63.1

## 2011-10-30 HISTORY — DX: Chronic kidney disease, stage 3 unspecified: N18.30

## 2011-11-07 ENCOUNTER — Telehealth: Payer: Self-pay | Admitting: Internal Medicine

## 2011-11-07 LAB — PACEMAKER DEVICE OBSERVATION
AL AMPLITUDE: 2.1 mv
AL THRESHOLD: 0.5 V
BAMS-0001: 150 {beats}/min
DEVICE MODEL PM: 2707573
RV LEAD IMPEDENCE PM: 462.5 Ohm
RV LEAD THRESHOLD: 1 V

## 2011-11-07 NOTE — Telephone Encounter (Signed)
Spoke w/daugther---pt having problems with SOB with exertion. Pt had AV opt on 10-10-11 with changes made that was helpful for couple of weeks. Now pt is feeling like she did before changes. Daughter would like a return call from Dr Johney Frame if possible after 3 pm.

## 2011-11-07 NOTE — Telephone Encounter (Signed)
New Problem:    Patient called because her mother has gotten worse after having her device adjusted the last time by Dr. Johney Frame and she was wondering if there was anything else that could be done for her.  Please call back, if calling after 3pm please call her cell number.

## 2011-11-08 ENCOUNTER — Encounter (HOSPITAL_COMMUNITY): Payer: Medicare Other | Attending: Oncology

## 2011-11-08 ENCOUNTER — Telehealth (HOSPITAL_COMMUNITY): Payer: Self-pay | Admitting: *Deleted

## 2011-11-08 DIAGNOSIS — D638 Anemia in other chronic diseases classified elsewhere: Secondary | ICD-10-CM | POA: Insufficient documentation

## 2011-11-08 DIAGNOSIS — C9110 Chronic lymphocytic leukemia of B-cell type not having achieved remission: Secondary | ICD-10-CM | POA: Insufficient documentation

## 2011-11-08 LAB — CBC
Hemoglobin: 10.8 g/dL — ABNORMAL LOW (ref 12.0–15.0)
MCV: 87.6 fL (ref 78.0–100.0)
Platelets: 171 10*3/uL (ref 150–400)
RBC: 3.78 MIL/uL — ABNORMAL LOW (ref 3.87–5.11)
WBC: 16.8 10*3/uL — ABNORMAL HIGH (ref 4.0–10.5)

## 2011-11-08 LAB — DIFFERENTIAL
Lymphocytes Relative: 80 % — ABNORMAL HIGH (ref 12–46)
Lymphs Abs: 13.5 10*3/uL — ABNORMAL HIGH (ref 0.7–4.0)
Neutrophils Relative %: 17 % — ABNORMAL LOW (ref 43–77)

## 2011-11-08 NOTE — Progress Notes (Signed)
Procrit held today due to hgb 10.8

## 2011-11-08 NOTE — Telephone Encounter (Signed)
Pt would like to come less frequently for labs since she has not had to receive procrit on her last few visits. Can we change to every 3 or 4 weeks.

## 2011-11-15 ENCOUNTER — Other Ambulatory Visit (HOSPITAL_COMMUNITY): Payer: Self-pay | Admitting: *Deleted

## 2011-11-22 ENCOUNTER — Ambulatory Visit (HOSPITAL_COMMUNITY): Payer: Medicare Other

## 2011-11-27 ENCOUNTER — Other Ambulatory Visit (HOSPITAL_COMMUNITY): Payer: Self-pay | Admitting: Oncology

## 2011-11-27 DIAGNOSIS — C9110 Chronic lymphocytic leukemia of B-cell type not having achieved remission: Secondary | ICD-10-CM

## 2011-11-27 MED ORDER — POLYSACCHARIDE IRON COMPLEX 150 MG PO CAPS: 150.0000 mg | ORAL_CAPSULE | ORAL | Status: AC

## 2011-12-12 ENCOUNTER — Other Ambulatory Visit (HOSPITAL_COMMUNITY): Payer: Self-pay | Admitting: Oncology

## 2011-12-12 ENCOUNTER — Encounter (HOSPITAL_COMMUNITY): Payer: Medicare Other | Attending: Oncology

## 2011-12-12 VITALS — BP 118/52

## 2011-12-12 DIAGNOSIS — C9110 Chronic lymphocytic leukemia of B-cell type not having achieved remission: Secondary | ICD-10-CM | POA: Insufficient documentation

## 2011-12-12 DIAGNOSIS — D638 Anemia in other chronic diseases classified elsewhere: Secondary | ICD-10-CM | POA: Insufficient documentation

## 2011-12-12 DIAGNOSIS — E119 Type 2 diabetes mellitus without complications: Secondary | ICD-10-CM | POA: Insufficient documentation

## 2011-12-12 DIAGNOSIS — E785 Hyperlipidemia, unspecified: Secondary | ICD-10-CM | POA: Insufficient documentation

## 2011-12-12 DIAGNOSIS — I10 Essential (primary) hypertension: Secondary | ICD-10-CM | POA: Insufficient documentation

## 2011-12-12 DIAGNOSIS — E039 Hypothyroidism, unspecified: Secondary | ICD-10-CM

## 2011-12-12 LAB — CBC
HCT: 30 % — ABNORMAL LOW (ref 36.0–46.0)
Hemoglobin: 9.8 g/dL — ABNORMAL LOW (ref 12.0–15.0)
RBC: 3.37 MIL/uL — ABNORMAL LOW (ref 3.87–5.11)
WBC: 16.2 10*3/uL — ABNORMAL HIGH (ref 4.0–10.5)

## 2011-12-12 LAB — DIFFERENTIAL
Basophils Absolute: 0 10*3/uL (ref 0.0–0.1)
Lymphocytes Relative: 81 % — ABNORMAL HIGH (ref 12–46)
Lymphs Abs: 13.1 10*3/uL — ABNORMAL HIGH (ref 0.7–4.0)
Monocytes Absolute: 0.2 10*3/uL (ref 0.1–1.0)
Monocytes Relative: 2 % — ABNORMAL LOW (ref 3–12)
Neutro Abs: 2.7 10*3/uL (ref 1.7–7.7)

## 2011-12-12 MED ORDER — EPOETIN ALFA 40000 UNIT/ML IJ SOLN
INTRAMUSCULAR | Status: AC
  Filled 2011-12-12: qty 1

## 2011-12-12 MED ORDER — EPOETIN ALFA 40000 UNIT/ML IJ SOLN
40000.0000 [IU] | Freq: Once | INTRAMUSCULAR | Status: AC
  Administered 2011-12-12: 40000 [IU] via SUBCUTANEOUS

## 2011-12-12 MED ORDER — EPOETIN ALFA 40000 UNIT/ML IJ SOLN: 40000.0000 [IU] | Freq: Once | INTRAMUSCULAR | Status: DC

## 2011-12-12 NOTE — Progress Notes (Signed)
Barbie A Luiz presents today for injection per MD orders. Procrit 40,000 units administered SQ in left Abdomen. Administration without incident. Patient tolerated well.  

## 2011-12-12 NOTE — Progress Notes (Signed)
Labs drawn today for cbc/diff 

## 2011-12-28 ENCOUNTER — Other Ambulatory Visit (HOSPITAL_COMMUNITY): Payer: Medicare Other

## 2012-01-02 ENCOUNTER — Encounter (HOSPITAL_COMMUNITY): Payer: Medicare Other | Attending: Oncology

## 2012-01-02 VITALS — BP 98/60 | HR 85 | Temp 98.4°F

## 2012-01-02 DIAGNOSIS — D638 Anemia in other chronic diseases classified elsewhere: Secondary | ICD-10-CM | POA: Insufficient documentation

## 2012-01-02 DIAGNOSIS — C9110 Chronic lymphocytic leukemia of B-cell type not having achieved remission: Secondary | ICD-10-CM | POA: Insufficient documentation

## 2012-01-02 LAB — DIFFERENTIAL
Basophils Absolute: 0 10*3/uL (ref 0.0–0.1)
Basophils Relative: 0 % (ref 0–1)
Eosinophils Absolute: 0.1 10*3/uL (ref 0.0–0.7)
Eosinophils Relative: 1 % (ref 0–5)
Lymphocytes Relative: 83 % — ABNORMAL HIGH (ref 12–46)

## 2012-01-02 LAB — CBC
Hemoglobin: 10.7 g/dL — ABNORMAL LOW (ref 12.0–15.0)
MCH: 30 pg (ref 26.0–34.0)
MCV: 93.3 fL (ref 78.0–100.0)
RBC: 3.57 MIL/uL — ABNORMAL LOW (ref 3.87–5.11)

## 2012-01-02 LAB — COMPREHENSIVE METABOLIC PANEL
ALT: 12 U/L (ref 0–35)
CO2: 28 mEq/L (ref 19–32)
Calcium: 9.8 mg/dL (ref 8.4–10.5)
GFR calc Af Amer: 50 mL/min — ABNORMAL LOW (ref >90)
GFR calc non Af Amer: 43 mL/min — ABNORMAL LOW (ref >90)
Glucose, Bld: 157 mg/dL — ABNORMAL HIGH (ref 70–99)
Sodium: 138 mEq/L (ref 135–145)

## 2012-01-02 MED ORDER — EPOETIN ALFA 40000 UNIT/ML IJ SOLN
40000.0000 [IU] | Freq: Once | INTRAMUSCULAR | Status: AC
  Administered 2012-01-02: 40000 [IU] via SUBCUTANEOUS

## 2012-01-02 MED ORDER — EPOETIN ALFA 40000 UNIT/ML IJ SOLN
INTRAMUSCULAR | Status: AC
  Filled 2012-01-02: qty 1

## 2012-01-02 NOTE — Progress Notes (Signed)
Tolerated injection well. 

## 2012-01-02 NOTE — Progress Notes (Signed)
Labs drawn today for cbc/diff,cmp,ldh 

## 2012-01-10 ENCOUNTER — Encounter (HOSPITAL_BASED_OUTPATIENT_CLINIC_OR_DEPARTMENT_OTHER): Payer: Medicare Other

## 2012-01-10 DIAGNOSIS — C9110 Chronic lymphocytic leukemia of B-cell type not having achieved remission: Secondary | ICD-10-CM

## 2012-01-10 LAB — CBC
MCH: 30 pg (ref 26.0–34.0)
MCHC: 31.9 g/dL (ref 30.0–36.0)
Platelets: 222 10*3/uL (ref 150–400)
RBC: 3.47 MIL/uL — ABNORMAL LOW (ref 3.87–5.11)
RDW: 17.9 % — ABNORMAL HIGH (ref 11.5–15.5)

## 2012-01-10 LAB — DIFFERENTIAL
Basophils Absolute: 0 10*3/uL (ref 0.0–0.1)
Eosinophils Absolute: 0.1 10*3/uL (ref 0.0–0.7)
Lymphocytes Relative: 80 % — ABNORMAL HIGH (ref 12–46)
Monocytes Absolute: 0.3 10*3/uL (ref 0.1–1.0)
Neutrophils Relative %: 17 % — ABNORMAL LOW (ref 43–77)

## 2012-01-10 LAB — LACTATE DEHYDROGENASE: LDH: 247 U/L (ref 94–250)

## 2012-01-10 LAB — COMPREHENSIVE METABOLIC PANEL
BUN: 30 mg/dL — ABNORMAL HIGH (ref 6–23)
CO2: 28 mEq/L (ref 19–32)
Chloride: 97 mEq/L (ref 96–112)
Creatinine, Ser: 1.22 mg/dL — ABNORMAL HIGH (ref 0.50–1.10)
GFR calc non Af Amer: 40 mL/min — ABNORMAL LOW (ref >90)
Glucose, Bld: 370 mg/dL — ABNORMAL HIGH (ref 70–99)
Total Bilirubin: 0.3 mg/dL (ref 0.3–1.2)

## 2012-01-10 NOTE — Progress Notes (Signed)
Labs drawn today for cbc/diff/cmp/ldh

## 2012-01-11 ENCOUNTER — Other Ambulatory Visit (HOSPITAL_COMMUNITY): Payer: Medicare Other

## 2012-01-14 ENCOUNTER — Encounter (HOSPITAL_BASED_OUTPATIENT_CLINIC_OR_DEPARTMENT_OTHER): Payer: Medicare Other | Admitting: Oncology

## 2012-01-14 VITALS — BP 136/73 | HR 87 | Temp 97.5°F | Resp 20 | Wt 139.8 lb

## 2012-01-14 DIAGNOSIS — C9110 Chronic lymphocytic leukemia of B-cell type not having achieved remission: Secondary | ICD-10-CM

## 2012-01-14 DIAGNOSIS — D638 Anemia in other chronic diseases classified elsewhere: Secondary | ICD-10-CM

## 2012-01-14 NOTE — Progress Notes (Signed)
Problem #1 anemia of chronic disease on Procrit with a nice response receiving the drug essentially every 4 weeks. Her hemoglobin is approximately 10-10.5 g.  Problem #2 CLL which is stable not in need of intervention at this time.  She tells that she's had a little bit of memory issues and she is on some therapy. She is not dysfunctional in my estimation at this point in time. She is alert she is oriented. She still home helping take care of her very debilitated and demented husband.  Her hemoglobins have been stable will continue Procrit every 4 weeks and try to maintain in the same range. We will probably not have to intervene with her CLL. We will see her in 6 months

## 2012-01-14 NOTE — Patient Instructions (Addendum)
Anne [name]  DOB 02-08-1928 CSN [id number]  MRN [id number] Dr. Glenford Peers   [facility]  Discharge Instructions  RECOMMENDATIONS MADE BY THE CONSULTANT AND ANY TEST RESULTS WILL BE SENT TO YOUR REFERRING DOCTOR.   EXAM FINDINGS BY MD TODAY AND SIGNS AND SYMPTOMS TO REPORT TO CLINIC OR PRIMARY MD: your hemoglobin is 10.4 now.  We will continue the procrit.  MEDICATIONS PRESCRIBED: none   INSTRUCTIONS GIVEN AND DISCUSSED: Other :  Report increased fatigue or shortness.  SPECIAL INSTRUCTIONS/FOLLOW-UP: Return to Clinic as scheduled for labs, procrit and to see PA in 6 months.   I acknowledge that I have been informed and understand all the instructions given to me and received a copy. I do not have any more questions at this time, but understand that I may call the Specialty Clinic at [facility] at [phone] during business hours should I have any further questions or need assistance in obtaining follow-up care.    __________________________________________  _____________  __________ Signature of Patient or Authorized Representative            Date                   Time    __________________________________________ Nurse's Signature

## 2012-01-23 ENCOUNTER — Encounter (HOSPITAL_COMMUNITY): Payer: Medicare Other

## 2012-01-23 ENCOUNTER — Encounter (HOSPITAL_BASED_OUTPATIENT_CLINIC_OR_DEPARTMENT_OTHER): Payer: Medicare Other

## 2012-01-23 DIAGNOSIS — D638 Anemia in other chronic diseases classified elsewhere: Secondary | ICD-10-CM

## 2012-01-23 DIAGNOSIS — C9110 Chronic lymphocytic leukemia of B-cell type not having achieved remission: Secondary | ICD-10-CM

## 2012-01-23 LAB — CBC
HCT: 34.3 % — ABNORMAL LOW (ref 36.0–46.0)
MCH: 30.3 pg (ref 26.0–34.0)
MCHC: 32.1 g/dL (ref 30.0–36.0)
RDW: 15.9 % — ABNORMAL HIGH (ref 11.5–15.5)

## 2012-01-23 NOTE — Progress Notes (Signed)
HGB 11 today. No Procrit required.

## 2012-01-23 NOTE — Progress Notes (Signed)
Labs drawn today for cbc 

## 2012-02-13 ENCOUNTER — Encounter (HOSPITAL_BASED_OUTPATIENT_CLINIC_OR_DEPARTMENT_OTHER): Payer: Medicare Other

## 2012-02-13 ENCOUNTER — Encounter (HOSPITAL_COMMUNITY): Payer: Medicare Other | Attending: Oncology

## 2012-02-13 VITALS — BP 133/72 | HR 84

## 2012-02-13 DIAGNOSIS — C9110 Chronic lymphocytic leukemia of B-cell type not having achieved remission: Secondary | ICD-10-CM

## 2012-02-13 DIAGNOSIS — D638 Anemia in other chronic diseases classified elsewhere: Secondary | ICD-10-CM

## 2012-02-13 LAB — CBC
MCH: 30.6 pg (ref 26.0–34.0)
Platelets: 168 10*3/uL (ref 150–400)
RBC: 3.53 MIL/uL — ABNORMAL LOW (ref 3.87–5.11)
RDW: 14.4 % (ref 11.5–15.5)

## 2012-02-13 MED ORDER — EPOETIN ALFA 40000 UNIT/ML IJ SOLN
INTRAMUSCULAR | Status: AC
  Filled 2012-02-13: qty 1

## 2012-02-13 MED ORDER — EPOETIN ALFA 40000 UNIT/ML IJ SOLN
40000.0000 [IU] | Freq: Once | INTRAMUSCULAR | Status: AC
  Administered 2012-02-13: 40000 [IU] via SUBCUTANEOUS

## 2012-02-13 NOTE — Progress Notes (Signed)
Labs drawn today for cbc 

## 2012-02-13 NOTE — Progress Notes (Signed)
Tolerated injection well. 

## 2012-03-05 ENCOUNTER — Encounter (HOSPITAL_BASED_OUTPATIENT_CLINIC_OR_DEPARTMENT_OTHER): Payer: Medicare Other

## 2012-03-05 ENCOUNTER — Encounter (HOSPITAL_COMMUNITY): Payer: Medicare Other | Attending: Oncology

## 2012-03-05 VITALS — BP 126/52 | HR 79 | Resp 18

## 2012-03-05 DIAGNOSIS — C9110 Chronic lymphocytic leukemia of B-cell type not having achieved remission: Secondary | ICD-10-CM | POA: Insufficient documentation

## 2012-03-05 DIAGNOSIS — D638 Anemia in other chronic diseases classified elsewhere: Secondary | ICD-10-CM

## 2012-03-05 LAB — CBC
HCT: 34.1 % — ABNORMAL LOW (ref 36.0–46.0)
MCV: 95 fL (ref 78.0–100.0)
Platelets: 195 10*3/uL (ref 150–400)
RBC: 3.59 MIL/uL — ABNORMAL LOW (ref 3.87–5.11)
WBC: 13.5 10*3/uL — ABNORMAL HIGH (ref 4.0–10.5)

## 2012-03-05 MED ORDER — EPOETIN ALFA 40000 UNIT/ML IJ SOLN
40000.0000 [IU] | Freq: Once | INTRAMUSCULAR | Status: AC
  Administered 2012-03-05: 40000 [IU] via SUBCUTANEOUS

## 2012-03-05 MED ORDER — EPOETIN ALFA 40000 UNIT/ML IJ SOLN
INTRAMUSCULAR | Status: AC
  Filled 2012-03-05: qty 1

## 2012-03-05 NOTE — Progress Notes (Signed)
Anne [name] presents today for injection per the provider's orders.  Procrit administered administration without incident; see MAR for injection details.  Patient tolerated procedure well and without incident.  No questions or complaints noted at this time.  Labs drawn previously by Amy Nance.  Per Supportive Therapy Plan & Dr. Thornton Papas last note, patient is to receive procrit q 4 weeks.  Lab Results  Component Value Date   HGB 10.9* 03/05/2012

## 2012-03-05 NOTE — Progress Notes (Signed)
Labs drawn today for cbc 

## 2012-03-25 ENCOUNTER — Encounter: Payer: Self-pay | Admitting: *Deleted

## 2012-03-25 DIAGNOSIS — Z950 Presence of cardiac pacemaker: Secondary | ICD-10-CM | POA: Insufficient documentation

## 2012-03-31 ENCOUNTER — Encounter: Payer: Self-pay | Admitting: Internal Medicine

## 2012-03-31 ENCOUNTER — Ambulatory Visit (INDEPENDENT_AMBULATORY_CARE_PROVIDER_SITE_OTHER): Payer: Medicare Other | Admitting: Internal Medicine

## 2012-03-31 VITALS — BP 128/60 | HR 68 | Ht 65.0 in | Wt 143.0 lb

## 2012-03-31 DIAGNOSIS — I2589 Other forms of chronic ischemic heart disease: Secondary | ICD-10-CM

## 2012-03-31 DIAGNOSIS — I519 Heart disease, unspecified: Secondary | ICD-10-CM

## 2012-03-31 DIAGNOSIS — I255 Ischemic cardiomyopathy: Secondary | ICD-10-CM

## 2012-03-31 LAB — PACEMAKER DEVICE OBSERVATION
AL AMPLITUDE: 1.6 mv
ATRIAL PACING PM: 1
RV LEAD THRESHOLD: 1 V
RV LEAD THRESHOLD: 1 V
VENTRICULAR PACING PM: 99

## 2012-03-31 NOTE — Assessment & Plan Note (Signed)
No ischemic symptoms CHF improved with CRT-P

## 2012-03-31 NOTE — Progress Notes (Signed)
PCP:  [name], MD Primary Cardiologist:  Dr Rennis Golden  The patient presents today for routine electrophysiology followup.  Since her recent BIV pacemaker optimized, the patient reports doing very well.  Her energy has improved.  She has recently been outside raking her yard.  Today, she denies symptoms of palpitations, chest pain, shortness of breath, orthopnea, PND, lower extremity edema, dizziness, presyncope, syncope, or neurologic sequela.  The patient feels that she is tolerating medications without difficulties and is otherwise without complaint today.   Past Medical History  Diagnosis Date  . CLL (chronic lymphocytic leukemia)     stable  . Anemia   . DM (diabetes mellitus)   . Hypercholesteremia   . DJD (degenerative joint disease) of knee     bilat  . Hypertension   . Coronary artery disease     three-vessel CAD with CABG -- x5 -- left internal mammary artery to LAD, sequential saphenous vein graft to first diagonal and first obtuse marginal, sequential saphenous vein graft to posterior descending and obtuse marginal 2 (posterolateral), endoscopic vein harvest of righ leg  . Shortness of breath   . CHF (congestive heart failure)     [place] Class 3 Heart Failure  . Blood transfusion   . CLL (chronic lymphocytic leukemia)   . CLL (chronic lymphocytic leukemia) 04/03/2007    Stage 0 CLL    . Anemia of chronic disease 10/30/2011   Past Surgical History  Procedure Date  . Coronary artery bypass graft 06/22/2009    x5 -- left internal mammary artery to LAD, sequential saphenous vein graft to first diagonal and first obtuse marginal, sequential saphenous vein graft to posterior descending and obtuse marginal 2 (posterolateral), endoscopic vein harvest of righ leg  . Goiter resection   . Thyroidectomy 1970s  . Cataract extraction, bilateral   . Rectal abscess   . Replacement total knee bilateral     bilateral  . Cardiac catheterization 01/18/2011    Est. EF of 25% to 30 - Mildly reduced  cardiac output with no signs of decompensated heart failure and normal right heart filling pressures -- Patent bypass grafts with underlying three-vessel coronary artery disease  . Cardiac catheterization 06/14/2009    Est. EF of of 45% - Multivessel coronary artery disease with high-grade lesions in the LAD, left circumflex, and RCA -- Mildly reduced left ventricular function -- No significant aortic stenosis or mitral regurgitation  . Pacemaker insertion 04/16/2011    biventricular pacemaker insertion  . Eye surgery     Current Outpatient Prescriptions  Medication Sig Dispense Refill  . acetaminophen (TYLENOL) 325 MG tablet Take 650 mg by mouth every 6 (six) hours as needed. For pain       . aspirin 81 MG EC tablet Take 81 mg by mouth daily.       . carvedilol (COREG) 6.25 MG tablet Take 9.375 mg by mouth 2 (two) times daily with a meal.       . citalopram (CELEXA) 10 MG tablet Take 10 mg by mouth 2 (two) times daily.       Marland Kitchen DIOVAN 160 MG tablet Take 160 mg by mouth 2 (two) times daily. 1 160mg  tablet each morning and 1/2 tablet at night      . Docusate Calcium (STOOL SOFTENER PO) Take 2 capsules by mouth as needed.       . furosemide (LASIX) 20 MG tablet Take 20 mg by mouth daily.        Marland Kitchen glimepiride (AMARYL) 4 MG  tablet Take 4 mg by mouth at bedtime.       . iron polysaccharides (NIFEREX) 150 MG capsule Take 1 capsule (150 mg total) by mouth 3 (three) times a week.  12 capsule  3  . KLOR-CON M20 20 MEQ tablet Take 20 mEq by mouth daily.       Marland Kitchen LORazepam (ATIVAN) 0.5 MG tablet Take 0.5 mg by mouth every 8 (eight) hours as needed. FOR NERVES      . metFORMIN (GLUCOPHAGE) 1000 MG tablet Take 1,000 mg by mouth 2 (two) times daily with a meal. Take 1 tablet in the morning and 1/2 a tablet in the evening       . Multiple Vitamins-Minerals (CENTRUM SILVER PO) Take 1 tablet by mouth daily.       Marland Kitchen NAMENDA 10 MG tablet Take 10 mg by mouth 2 (two) times daily.       . pravastatin (PRAVACHOL) 40  MG tablet Take 40 mg by mouth Daily.      . TRADJENTA 5 MG TABS tablet Take 5 mg by mouth daily. Take one-half tablet at night      . traMADol (ULTRAM) 50 MG tablet Take 50 mg by mouth as needed.         Allergies  Allergen Reactions  . Lisinopril     REACTION: Dry Cough    History   Social History  . Marital Status: Married    Spouse Name: N/A    Number of Children: N/A  . Years of Education: N/A   Occupational History  . Not on file.   Social History Main Topics  . Smoking status: Never Smoker   . Smokeless tobacco: Never Used  . Alcohol Use: No  . Drug Use: No  . Sexually Active: No   Other Topics Concern  . Not on file   Social History Narrative   [employer] in [place].  Engineer, petroleum prior to retiring.    Physical Exam: Filed Vitals:   03/31/12 1532  BP: 128/60  Pulse: 68  Height: 5\' 5"  (1.651 m)  Weight: 143 lb (64.864 kg)  SpO2: 98%    GEN- The patient is elderly appearing, alert and oriented x 3 today.   Head- normocephalic, atraumatic Eyes-  Sclera clear, conjunctiva pink Ears- hearing intact Oropharynx- clear Neck- supple Lungs- Clear to ausculation bilaterally, normal work of breathing Chest- pacemaker pocket is well healed Heart- Regular rate and rhythm  GI- soft, NT, ND, + BS Extremities- no clubbing, cyanosis, or edema  Pacemaker interrogation- reviewed in detail today,  See PACEART report  Assessment and Plan:

## 2012-03-31 NOTE — Patient Instructions (Signed)
Continue all current medications. Dr. Allred - 1 year  

## 2012-03-31 NOTE — Assessment & Plan Note (Signed)
Normal biventricular pacemaker function See Pace Art report No changes today  She wishes for me to follow her device.  We will therefore enroll in [facility] She will return in 1 year She will continue to follow closely with Dr Rennis Golden for routine cardiology care.

## 2012-04-02 ENCOUNTER — Encounter (HOSPITAL_COMMUNITY): Payer: Medicare Other | Attending: Oncology

## 2012-04-02 ENCOUNTER — Encounter (HOSPITAL_COMMUNITY): Payer: Medicare Other

## 2012-04-02 DIAGNOSIS — D638 Anemia in other chronic diseases classified elsewhere: Secondary | ICD-10-CM

## 2012-04-02 DIAGNOSIS — C9110 Chronic lymphocytic leukemia of B-cell type not having achieved remission: Secondary | ICD-10-CM

## 2012-04-02 LAB — CBC
MCHC: 32.1 g/dL (ref 30.0–36.0)
MCV: 92.1 fL (ref 78.0–100.0)
Platelets: 178 10*3/uL (ref 150–400)
RDW: 14.7 % (ref 11.5–15.5)
WBC: 16.7 10*3/uL — ABNORMAL HIGH (ref 4.0–10.5)

## 2012-04-02 NOTE — Progress Notes (Signed)
Labs drawn today for cbc 

## 2012-04-02 NOTE — Progress Notes (Signed)
Procrit held d/t Hgb 11.3.

## 2012-04-30 ENCOUNTER — Encounter (HOSPITAL_COMMUNITY): Payer: Medicare Other | Attending: Oncology

## 2012-04-30 ENCOUNTER — Encounter (HOSPITAL_COMMUNITY): Payer: Medicare Other

## 2012-04-30 VITALS — BP 134/75 | HR 74

## 2012-04-30 DIAGNOSIS — C9110 Chronic lymphocytic leukemia of B-cell type not having achieved remission: Secondary | ICD-10-CM

## 2012-04-30 DIAGNOSIS — D638 Anemia in other chronic diseases classified elsewhere: Secondary | ICD-10-CM

## 2012-04-30 LAB — CBC
Hemoglobin: 9.7 g/dL — ABNORMAL LOW (ref 12.0–15.0)
RBC: 3.33 MIL/uL — ABNORMAL LOW (ref 3.87–5.11)

## 2012-04-30 MED ORDER — EPOETIN ALFA 40000 UNIT/ML IJ SOLN
40000.0000 [IU] | Freq: Once | INTRAMUSCULAR | Status: AC
  Administered 2012-04-30: 40000 [IU] via SUBCUTANEOUS

## 2012-04-30 MED ORDER — EPOETIN ALFA 40000 UNIT/ML IJ SOLN
INTRAMUSCULAR | Status: AC
  Filled 2012-04-30: qty 1

## 2012-04-30 NOTE — Progress Notes (Signed)
Labs drawn today for cbc 

## 2012-04-30 NOTE — Progress Notes (Signed)
Tolerated injection well. 

## 2012-05-29 ENCOUNTER — Other Ambulatory Visit (HOSPITAL_COMMUNITY): Payer: Medicare Other

## 2012-05-29 ENCOUNTER — Encounter (HOSPITAL_COMMUNITY): Payer: Medicare Other

## 2012-05-29 ENCOUNTER — Ambulatory Visit (HOSPITAL_COMMUNITY): Payer: Medicare Other

## 2012-05-29 ENCOUNTER — Encounter (HOSPITAL_COMMUNITY): Payer: Medicare Other | Attending: Oncology

## 2012-05-29 VITALS — BP 107/64 | HR 81 | Resp 16

## 2012-05-29 DIAGNOSIS — I10 Essential (primary) hypertension: Secondary | ICD-10-CM

## 2012-05-29 DIAGNOSIS — D638 Anemia in other chronic diseases classified elsewhere: Secondary | ICD-10-CM | POA: Insufficient documentation

## 2012-05-29 DIAGNOSIS — C9110 Chronic lymphocytic leukemia of B-cell type not having achieved remission: Secondary | ICD-10-CM | POA: Insufficient documentation

## 2012-05-29 DIAGNOSIS — E119 Type 2 diabetes mellitus without complications: Secondary | ICD-10-CM

## 2012-05-29 LAB — CBC
HCT: 33.5 % — ABNORMAL LOW (ref 36.0–46.0)
MCV: 92.3 fL (ref 78.0–100.0)
Platelets: 240 10*3/uL (ref 150–400)
RBC: 3.63 MIL/uL — ABNORMAL LOW (ref 3.87–5.11)
WBC: 20.5 10*3/uL — ABNORMAL HIGH (ref 4.0–10.5)

## 2012-05-29 MED ORDER — EPOETIN ALFA 40000 UNIT/ML IJ SOLN
40000.0000 [IU] | Freq: Once | INTRAMUSCULAR | Status: AC
  Administered 2012-05-29: 40000 [IU] via SUBCUTANEOUS

## 2012-05-29 MED ORDER — EPOETIN ALFA 40000 UNIT/ML IJ SOLN
INTRAMUSCULAR | Status: AC
  Filled 2012-05-29: qty 1

## 2012-05-29 NOTE — Progress Notes (Signed)
Anne [name]'s reason for visit today is for an injection and labs as scheduled per MD orders.  Labs were drawn prior to administration of ordered medication.  Venipuncture performed with a 23 gauge butterfly needle to R Antecubital.  Anne [name] also received Procrit per MD orders; see [facility] for administration details.  Anne [name] tolerated all procedures well and without incident; questions were answered and patient was discharged.

## 2012-05-30 ENCOUNTER — Other Ambulatory Visit (HOSPITAL_COMMUNITY): Payer: Medicare Other

## 2012-05-30 ENCOUNTER — Ambulatory Visit (HOSPITAL_COMMUNITY): Payer: Medicare Other

## 2012-06-26 ENCOUNTER — Ambulatory Visit (HOSPITAL_COMMUNITY): Payer: Medicare Other

## 2012-06-26 ENCOUNTER — Other Ambulatory Visit (HOSPITAL_COMMUNITY): Payer: Medicare Other

## 2012-06-27 ENCOUNTER — Ambulatory Visit (HOSPITAL_COMMUNITY): Payer: Medicare Other

## 2012-06-27 ENCOUNTER — Other Ambulatory Visit (HOSPITAL_COMMUNITY): Payer: Medicare Other

## 2012-06-30 ENCOUNTER — Encounter (HOSPITAL_COMMUNITY): Payer: Medicare Other | Attending: Oncology

## 2012-06-30 ENCOUNTER — Encounter (HOSPITAL_BASED_OUTPATIENT_CLINIC_OR_DEPARTMENT_OTHER): Payer: Medicare Other

## 2012-06-30 VITALS — BP 134/69 | HR 82

## 2012-06-30 DIAGNOSIS — C9110 Chronic lymphocytic leukemia of B-cell type not having achieved remission: Secondary | ICD-10-CM

## 2012-06-30 DIAGNOSIS — D638 Anemia in other chronic diseases classified elsewhere: Secondary | ICD-10-CM

## 2012-06-30 LAB — CBC
Hemoglobin: 10.7 g/dL — ABNORMAL LOW (ref 12.0–15.0)
RBC: 3.62 MIL/uL — ABNORMAL LOW (ref 3.87–5.11)

## 2012-06-30 MED ORDER — EPOETIN ALFA 40000 UNIT/ML IJ SOLN
INTRAMUSCULAR | Status: AC
  Filled 2012-06-30: qty 1

## 2012-06-30 MED ORDER — EPOETIN ALFA 40000 UNIT/ML IJ SOLN: 40000.0000 [IU] | Freq: Once | INTRAMUSCULAR | Status: DC

## 2012-06-30 NOTE — Progress Notes (Signed)
Anne [name] presents today for injection per MD orders. Procrit 40000units administered SQ in left Abdomen. Administration without incident. Patient tolerated well.

## 2012-06-30 NOTE — Progress Notes (Signed)
Labs drawn today for cbc 

## 2012-07-07 ENCOUNTER — Ambulatory Visit (INDEPENDENT_AMBULATORY_CARE_PROVIDER_SITE_OTHER): Payer: Medicare Other | Admitting: *Deleted

## 2012-07-07 DIAGNOSIS — I255 Ischemic cardiomyopathy: Secondary | ICD-10-CM

## 2012-07-07 DIAGNOSIS — Z950 Presence of cardiac pacemaker: Secondary | ICD-10-CM

## 2012-07-07 DIAGNOSIS — I2589 Other forms of chronic ischemic heart disease: Secondary | ICD-10-CM

## 2012-07-11 ENCOUNTER — Encounter: Payer: Self-pay | Admitting: Internal Medicine

## 2012-07-11 ENCOUNTER — Other Ambulatory Visit: Payer: Self-pay | Admitting: Internal Medicine

## 2012-07-11 LAB — REMOTE PACEMAKER DEVICE
AL AMPLITUDE: 1.6 mv
AL THRESHOLD: 1.875 V
DEVICE MODEL PM: 2707573
LV LEAD IMPEDENCE PM: 840 Ohm
RV LEAD THRESHOLD: 1.125 V

## 2012-07-16 ENCOUNTER — Encounter (HOSPITAL_BASED_OUTPATIENT_CLINIC_OR_DEPARTMENT_OTHER): Payer: Medicare Other | Admitting: Oncology

## 2012-07-16 ENCOUNTER — Encounter (HOSPITAL_COMMUNITY): Payer: Self-pay | Admitting: Oncology

## 2012-07-16 VITALS — BP 132/64 | HR 74 | Temp 97.3°F | Resp 18 | Wt 139.6 lb

## 2012-07-16 DIAGNOSIS — D638 Anemia in other chronic diseases classified elsewhere: Secondary | ICD-10-CM

## 2012-07-16 DIAGNOSIS — I10 Essential (primary) hypertension: Secondary | ICD-10-CM

## 2012-07-16 DIAGNOSIS — C9110 Chronic lymphocytic leukemia of B-cell type not having achieved remission: Secondary | ICD-10-CM

## 2012-07-16 DIAGNOSIS — E119 Type 2 diabetes mellitus without complications: Secondary | ICD-10-CM

## 2012-07-16 NOTE — Patient Instructions (Addendum)
.  [facility] Discharge Instructions  RECOMMENDATIONS MADE BY THE CONSULTANT AND ANY TEST RESULTS WILL BE SENT TO YOUR REFERRING PHYSICIAN.  EXAM FINDINGS BY THE PHYSICIAN TODAY AND SIGNS OR SYMPTOMS TO REPORT TO CLINIC OR PRIMARY PHYSICIAN: We will continue procrit and labs every 4 weeks  If your hemoglobin is greater than 11 we will hold procrit  INSTRUCTIONS GIVEN AND DISCUSSED: Return in 6 months to see the DR.    Thank you for choosing [facility] to provide your oncology and hematology care.  To afford each patient quality time with our providers, please arrive at least 15 minutes before your scheduled appointment time.  With your help, our goal is to use those 15 minutes to complete the necessary work-up to ensure our physicians have the information they need to help with your evaluation and healthcare recommendations.    Effective January 1st, 2014, we ask that you re-schedule your appointment with our physicians should you arrive 10 or more minutes late for your appointment.  We strive to give you quality time with our providers, and arriving late affects you and other patients whose appointments are after yours.    Again, thank you for choosing [facility].  Our hope is that these requests will decrease the amount of time that you wait before being seen by our physicians.       _____________________________________________________________  Should you have questions after your visit to [facility], please contact our office at [phone] between the hours of 8:30 a.m. and 5:00 p.m.  Voicemails left after 4:30 p.m. will not be returned until the following business day.  For prescription refill requests, have your pharmacy contact our office with your prescription refill request.

## 2012-07-16 NOTE — Progress Notes (Signed)
Anne Melena, MD [address] [place] [address]  CLL (chronic lymphocytic leukemia)  CURRENT THERAPY: Observation for CLL and Procrit 40,000 U every 4 weeks  INTERVAL HISTORY: Anne [name] 79 y.o. female returns for  regular  visit for followup of  CLL which remains stable and not in need of intervention and anemia of chronic disease with a nice response to Procrit which she receives every 4 weeks.   Anne [name] is doing well.  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  He still recognizes her, but it is clear that his dementia is progressing per the information Anne [name] provides.  She is tolerating this personal stressor quite well.    I personally reviewed and went over laboratory results with the patient.  Her labs are stable.  White count is mildly elevated secondary to CLL, but stable.  Her platelet count is stable and WNL.  Her Hgb is very stable ranging in the 10.5- 11 g/dL range with the assistance of Procrit 40,000 units every 4 weeks.    We will continue to perform a CBC every 4 weeks for Procrit.  She will receive procrit if her Hgb meets treatment criteria.    Hematologically she denies any complaints and ROS questioning is negative.  Her appetite is stable along with her weight.   Past Medical History  Diagnosis Date  . CLL (chronic lymphocytic leukemia)     stable  . Anemia   . DM (diabetes mellitus)   . Hypercholesteremia   . DJD (degenerative joint disease) of knee     bilat  . Hypertension   . Coronary artery disease     three-vessel CAD with CABG -- x5 -- left internal mammary artery to LAD, sequential saphenous vein graft to first diagonal and first obtuse marginal, sequential saphenous vein graft to posterior descending and obtuse marginal 2 (posterolateral), endoscopic vein harvest of righ leg  . Shortness of breath   . CHF (congestive heart failure)     [place] Class 3 Heart [place]  . Blood transfusion   . CLL (chronic  lymphocytic leukemia)   . CLL (chronic lymphocytic leukemia) 04/03/2007    Stage 0 CLL    . Anemia of chronic disease 10/30/2011    has CLL (chronic lymphocytic leukemia); HYPOTHYROIDISM; DIABETES MELLITUS, TYPE II, UNCONTROLLED; HYPERLIPIDEMIA; HYPERKALEMIA; Anemia of chronic disease; DEPRESSION; CATARACTS; HYPERTENSION; BRONCHITIS, ACUTE; ARTHRITIS; LOW BACK PAIN, CHRONIC; BURSITIS, ACROMIOCLAVICULAR, LEFT; MEMORY LOSS; FECAL INCONTINENCE; LACERATION, HAND, LEFT; Ischemic cardiomyopathy; Chronic systolic dysfunction of left ventricle; Anemia of chronic disease; and Pacemaker-St.Jude on her problem list.     is allergic to lisinopril.  Ms. Island does not currently have medications on file.  Past Surgical History  Procedure Laterality Date  . Coronary artery bypass graft  06/22/2009    x5 -- left internal mammary artery to LAD, sequential saphenous vein graft to first diagonal and first obtuse marginal, sequential saphenous vein graft to posterior descending and obtuse marginal 2 (posterolateral), endoscopic vein harvest of righ leg  . Goiter resection    . Thyroidectomy  1970s  . Cataract extraction, bilateral    . Rectal abscess    . Replacement total knee bilateral      bilateral  . Cardiac catheterization  01/18/2011    Est. EF of 25% to 30 - Mildly reduced cardiac output with no signs of decompensated heart failure and normal right heart filling pressures -- Patent bypass grafts with underlying three-vessel coronary artery disease  .  Cardiac catheterization  06/14/2009    Est. EF of of 45% - Multivessel coronary artery disease with high-grade lesions in the LAD, left circumflex, and RCA -- Mildly reduced left ventricular function -- No significant aortic stenosis or mitral regurgitation  . Pacemaker insertion  04/16/2011    biventricular pacemaker insertion  . Eye surgery      Denies any headaches, dizziness, double vision, fevers, chills, night sweats, nausea, vomiting, diarrhea,  constipation, chest pain, heart palpitations, shortness of breath, blood in stool, black tarry stool, urinary pain, urinary burning, urinary frequency, hematuria.   PHYSICAL EXAMINATION  ECOG PERFORMANCE STATUS: 2 - Symptomatic, <50% confined to bed  Filed Vitals:   07/16/12 0957  BP: 132/64  Pulse: 74  Temp: 97.3 F (36.3 C)  Resp: 18   GENERAL:alert, no distress, well nourished, well developed, comfortable, cooperative and smiling  SKIN: skin color, texture, turgor are normal, no rashes or significant lesions  HEAD: Normocephalic, No masses, lesions, tenderness or abnormalities  EYES: normal, EOMI, Conjunctiva are pink and non-injected  EARS: External ears normal  OROPHARYNX:lips, buccal mucosa, and tongue normal and mucous membranes are moist  NECK: supple, trachea midline, B/L angle of the jaw lymph nodes measuring 0.5- 1 cm (L >R).  LYMPH: no hepatosplenomegaly  BREAST:not examined  LUNGS: clear to auscultation and percussion  HEART: regular rate & rhythm, no murmurs, no gallops, S1 normal and S2 normal  ABDOMEN:abdomen soft, non-tender, normal bowel sounds, no masses or organomegaly and no hepatosplenomegaly  BACK: Back symmetric, no curvature., No CVA tenderness  EXTREMITIES:less then 2 second capillary refill, no joint deformities, effusion, or inflammation, no edema, no skin discoloration, no clubbing, no cyanosis  NEURO: alert & oriented x 3 with fluent speech, no focal motor/sensory deficits, gait normal with assistance of cane.     LABORATORY DATA: CBC    Component Value Date/Time   WBC 17.9* 06/30/2012 1212   RBC 3.62* 06/30/2012 1212   HGB 10.7* 06/30/2012 1212   HCT 33.3* 06/30/2012 1212   PLT 235 06/30/2012 1212   MCV 92.0 06/30/2012 1212   MCH 29.6 06/30/2012 1212   MCHC 32.1 06/30/2012 1212   RDW 15.6* 06/30/2012 1212   LYMPHSABS 11.2* 01/10/2012 1100   MONOABS 0.3 01/10/2012 1100   EOSABS 0.1 01/10/2012 1100   BASOSABS 0.0 01/10/2012 1100      ASSESSMENT:  1. Stage  0 CLL, not requiring any therapy  2. Anemia of chronic disease with a nice response to Procrit 40,000 units every 4 weeks.  3. DM, uncontrolled  4. HTN 5. Main caregiver for her ailing/debilitated and demented husband   PLAN:  1. I personally reviewed and went over laboratory results with the patient. 2. Continue Procrit 40,000 U every 4 weeks. 3. CBC every 4 weeks.  4. Patient declined orthopod referral.  If she were to change her mind, I have asked her to call the clinic and I would be happy to make that referral.  5. Return in 6 months for follow-up.    All questions were answered. The patient knows to call the clinic with any problems, questions or concerns. We can certainly see the patient much sooner if necessary.  The patient and plan discussed with Glenford Peers, MD and he is in agreement with the aforementioned.  KEFALAS,THOMAS

## 2012-07-28 ENCOUNTER — Encounter (HOSPITAL_BASED_OUTPATIENT_CLINIC_OR_DEPARTMENT_OTHER): Payer: Medicare Other

## 2012-07-28 ENCOUNTER — Encounter (HOSPITAL_COMMUNITY): Payer: Medicare Other | Attending: Oncology

## 2012-07-28 VITALS — BP 126/68 | HR 81 | Resp 16

## 2012-07-28 DIAGNOSIS — C9110 Chronic lymphocytic leukemia of B-cell type not having achieved remission: Secondary | ICD-10-CM | POA: Insufficient documentation

## 2012-07-28 DIAGNOSIS — D638 Anemia in other chronic diseases classified elsewhere: Secondary | ICD-10-CM

## 2012-07-28 LAB — CBC
Platelets: 211 10*3/uL (ref 150–400)
RBC: 3.5 MIL/uL — ABNORMAL LOW (ref 3.87–5.11)
RDW: 14.9 % (ref 11.5–15.5)
WBC: 18.3 10*3/uL — ABNORMAL HIGH (ref 4.0–10.5)

## 2012-07-28 MED ORDER — EPOETIN ALFA 40000 UNIT/ML IJ SOLN
40000.0000 [IU] | Freq: Once | INTRAMUSCULAR | Status: AC
  Administered 2012-07-28: 40000 [IU] via SUBCUTANEOUS

## 2012-07-28 MED ORDER — EPOETIN ALFA 40000 UNIT/ML IJ SOLN
INTRAMUSCULAR | Status: AC
  Filled 2012-07-28: qty 1

## 2012-07-28 NOTE — Progress Notes (Signed)
Labs drawn today for cbc 

## 2012-07-28 NOTE — Progress Notes (Signed)
Anne [name] presents today for injection per MD orders. Procrit 40,000 administered SQ in left Abdomen. Administration without incident. Patient tolerated well.  

## 2012-08-04 ENCOUNTER — Encounter: Payer: Self-pay | Admitting: *Deleted

## 2012-08-08 ENCOUNTER — Other Ambulatory Visit (HOSPITAL_COMMUNITY): Payer: Self-pay | Admitting: Oncology

## 2012-08-13 ENCOUNTER — Encounter (HOSPITAL_COMMUNITY): Payer: Medicare Other

## 2012-08-13 ENCOUNTER — Encounter (HOSPITAL_BASED_OUTPATIENT_CLINIC_OR_DEPARTMENT_OTHER): Payer: Medicare Other

## 2012-08-13 DIAGNOSIS — C9110 Chronic lymphocytic leukemia of B-cell type not having achieved remission: Secondary | ICD-10-CM

## 2012-08-13 DIAGNOSIS — D638 Anemia in other chronic diseases classified elsewhere: Secondary | ICD-10-CM

## 2012-08-13 LAB — CBC
HCT: 34.8 % — ABNORMAL LOW (ref 36.0–46.0)
Hemoglobin: 11.2 g/dL — ABNORMAL LOW (ref 12.0–15.0)
MCHC: 32.2 g/dL (ref 30.0–36.0)
WBC: 16.4 10*3/uL — ABNORMAL HIGH (ref 4.0–10.5)

## 2012-08-13 NOTE — Progress Notes (Signed)
Labs drawn today for cbc 

## 2012-08-13 NOTE — Progress Notes (Signed)
Procrit held today, Hgb 11.2.  Patient to return to clinic in 4 weeks for labs +/- procrit.

## 2012-09-09 ENCOUNTER — Encounter (HOSPITAL_COMMUNITY): Payer: Medicare Other | Attending: Oncology

## 2012-09-09 ENCOUNTER — Encounter (HOSPITAL_BASED_OUTPATIENT_CLINIC_OR_DEPARTMENT_OTHER): Payer: Medicare Other

## 2012-09-09 VITALS — BP 85/61 | HR 78

## 2012-09-09 DIAGNOSIS — C9110 Chronic lymphocytic leukemia of B-cell type not having achieved remission: Secondary | ICD-10-CM

## 2012-09-09 DIAGNOSIS — D638 Anemia in other chronic diseases classified elsewhere: Secondary | ICD-10-CM | POA: Insufficient documentation

## 2012-09-09 LAB — CBC
HCT: 30.7 % — ABNORMAL LOW (ref 36.0–46.0)
Hemoglobin: 10.1 g/dL — ABNORMAL LOW (ref 12.0–15.0)
MCHC: 32.9 g/dL (ref 30.0–36.0)
WBC: 22.7 10*3/uL — ABNORMAL HIGH (ref 4.0–10.5)

## 2012-09-09 MED ORDER — EPOETIN ALFA 40000 UNIT/ML IJ SOLN
INTRAMUSCULAR | Status: AC
  Filled 2012-09-09: qty 1

## 2012-09-09 MED ORDER — EPOETIN ALFA 40000 UNIT/ML IJ SOLN
40000.0000 [IU] | Freq: Once | INTRAMUSCULAR | Status: AC
  Administered 2012-09-09: 40000 [IU] via SUBCUTANEOUS

## 2012-09-09 NOTE — Progress Notes (Signed)
Labs drawn today for cbc 

## 2012-09-09 NOTE — Progress Notes (Signed)
Anne [name] presents today for injection per MD orders. Procrit 40981 units administered SQ in right Abdomen. Administration without incident. Patient tolerated well. Patient was informed of blood pressure being hypotensive and she informed me that her daughter took care of her medications and monitored her blood pressure, daughter is a Engineer, civil (consulting) at [employer]  I gave the patient a note listing her blood pressure and pulse, and medications she was taking for blood pressure so that she could bring it to her daughter's attention.  Also informed patient of need to stand slowly and use fall precautions.

## 2012-10-02 ENCOUNTER — Encounter: Payer: Self-pay | Admitting: Internal Medicine

## 2012-10-06 ENCOUNTER — Encounter: Payer: Self-pay | Admitting: Internal Medicine

## 2012-10-06 ENCOUNTER — Ambulatory Visit (INDEPENDENT_AMBULATORY_CARE_PROVIDER_SITE_OTHER): Payer: Medicare Other | Admitting: *Deleted

## 2012-10-06 ENCOUNTER — Other Ambulatory Visit: Payer: Self-pay | Admitting: Internal Medicine

## 2012-10-06 DIAGNOSIS — I255 Ischemic cardiomyopathy: Secondary | ICD-10-CM

## 2012-10-06 DIAGNOSIS — I2589 Other forms of chronic ischemic heart disease: Secondary | ICD-10-CM

## 2012-10-06 DIAGNOSIS — Z950 Presence of cardiac pacemaker: Secondary | ICD-10-CM

## 2012-10-06 LAB — REMOTE PACEMAKER DEVICE
AL THRESHOLD: 1.25 V
ATRIAL PACING PM: 1
BAMS-0001: 150 {beats}/min
RV LEAD AMPLITUDE: 12 mv
RV LEAD IMPEDENCE PM: 600 Ohm

## 2012-10-08 ENCOUNTER — Encounter (HOSPITAL_COMMUNITY): Payer: Medicare Other | Attending: Oncology

## 2012-10-08 ENCOUNTER — Encounter (HOSPITAL_BASED_OUTPATIENT_CLINIC_OR_DEPARTMENT_OTHER): Payer: Medicare Other

## 2012-10-08 VITALS — BP 103/58 | HR 79

## 2012-10-08 DIAGNOSIS — C9110 Chronic lymphocytic leukemia of B-cell type not having achieved remission: Secondary | ICD-10-CM | POA: Insufficient documentation

## 2012-10-08 DIAGNOSIS — D638 Anemia in other chronic diseases classified elsewhere: Secondary | ICD-10-CM

## 2012-10-08 LAB — CBC
HCT: 30.7 % — ABNORMAL LOW (ref 36.0–46.0)
Hemoglobin: 10 g/dL — ABNORMAL LOW (ref 12.0–15.0)
MCH: 29.4 pg (ref 26.0–34.0)
MCHC: 32.6 g/dL (ref 30.0–36.0)

## 2012-10-08 MED ORDER — EPOETIN ALFA 40000 UNIT/ML IJ SOLN
40000.0000 [IU] | Freq: Once | INTRAMUSCULAR | Status: AC
  Administered 2012-10-08: 40000 [IU] via SUBCUTANEOUS

## 2012-10-08 MED ORDER — EPOETIN ALFA 40000 UNIT/ML IJ SOLN
INTRAMUSCULAR | Status: AC
  Filled 2012-10-08: qty 1

## 2012-10-08 NOTE — Progress Notes (Signed)
Gionna A Freese presents today for injection per MD orders. Procrit 40,000 units administered SQ in left Abdomen. Administration without incident. Patient tolerated well.  

## 2012-10-08 NOTE — Progress Notes (Signed)
Labs drawn today for cbc 

## 2012-10-15 ENCOUNTER — Encounter: Payer: Self-pay | Admitting: *Deleted

## 2012-10-16 ENCOUNTER — Ambulatory Visit (HOSPITAL_COMMUNITY)
Admission: RE | Admit: 2012-10-16 | Discharge: 2012-10-16 | Disposition: A | Discharge status: Home / Self Care | Payer: Medicare Other | Source: Ambulatory Visit | Attending: Internal Medicine | Admitting: Internal Medicine

## 2012-10-16 ENCOUNTER — Other Ambulatory Visit (HOSPITAL_COMMUNITY): Payer: Self-pay | Admitting: Internal Medicine

## 2012-10-16 DIAGNOSIS — M51379 Other intervertebral disc degeneration, lumbosacral region without mention of lumbar back pain or lower extremity pain: Secondary | ICD-10-CM | POA: Insufficient documentation

## 2012-10-16 DIAGNOSIS — R52 Pain, unspecified: Secondary | ICD-10-CM

## 2012-10-16 DIAGNOSIS — M5137 Other intervertebral disc degeneration, lumbosacral region: Secondary | ICD-10-CM | POA: Insufficient documentation

## 2012-10-16 DIAGNOSIS — M545 Low back pain, unspecified: Secondary | ICD-10-CM | POA: Insufficient documentation

## 2012-10-16 DIAGNOSIS — M47817 Spondylosis without myelopathy or radiculopathy, lumbosacral region: Secondary | ICD-10-CM | POA: Insufficient documentation

## 2012-10-30 ENCOUNTER — Encounter: Payer: Self-pay | Admitting: Internal Medicine

## 2012-10-30 ENCOUNTER — Ambulatory Visit (INDEPENDENT_AMBULATORY_CARE_PROVIDER_SITE_OTHER): Payer: Medicare Other | Admitting: Internal Medicine

## 2012-10-30 VITALS — BP 150/60 | HR 75 | Ht 65.0 in | Wt 141.6 lb

## 2012-10-30 DIAGNOSIS — I2589 Other forms of chronic ischemic heart disease: Secondary | ICD-10-CM

## 2012-10-30 DIAGNOSIS — I10 Essential (primary) hypertension: Secondary | ICD-10-CM

## 2012-10-30 DIAGNOSIS — Z950 Presence of cardiac pacemaker: Secondary | ICD-10-CM

## 2012-10-30 DIAGNOSIS — I255 Ischemic cardiomyopathy: Secondary | ICD-10-CM

## 2012-10-30 DIAGNOSIS — E785 Hyperlipidemia, unspecified: Secondary | ICD-10-CM

## 2012-10-30 NOTE — Progress Notes (Signed)
OFFICE NOTE  Chief Complaint:  Routine follow-up  Primary Care Physician: Catalina Pizza, MD  HPI:  Anne [name] is an 79 year old female with a history of ischemic cardiomyopathy, EF of 25-30% and marked intraventricular conduction delay. She had a BiV pacemaker placed by Dr. Johney Frame in March and recently had that adjusted. She had an echocardiogram on April 07, 2012. This showed borderline dilated left ventricle with an EF of 35%. There is still moderate intraventricular dyssynchrony, however, improved from previous studies. The left atrium is mildly dilated. There is moderate MR and moderate to severe TR with an RVSP of 31. Symptomatically actually she feels pretty good with no worsening shortness of breath, palpitations, presyncope, syncopal symptoms or chest pain. Her only complaint seems to be with low back pain. Recently she had a lumbar spine x-ray series which demonstrated severe degenerative disease of the lower spine. I understand she is scheduled to see a neurosurgeon.  PMHx:  Past Medical History  Diagnosis Date  . CLL (chronic lymphocytic leukemia)     stable  . Anemia   . DM (diabetes mellitus)   . Hypercholesteremia   . DJD (degenerative joint disease) of knee     bilat  . Hypertension   . Coronary artery disease     three-vessel CAD with CABG -- x5 -- left internal mammary artery to LAD, sequential saphenous vein graft to first diagonal and first obtuse marginal, sequential saphenous vein graft to posterior descending and obtuse marginal 2 (posterolateral), endoscopic vein harvest of righ leg  . Shortness of breath   . CHF (congestive heart failure)     [place] Class 3 Heart Failure  . Blood transfusion   . CLL (chronic lymphocytic leukemia)   . CLL (chronic lymphocytic leukemia) 04/03/2007    Stage 0 CLL    . Anemia of chronic disease 10/30/2011  . Ischemic cardiomyopathy     EF of 35% on 2D Echo done 04/07/12 moderate intraventricular dyssynchrony, moderate MR and  moderate to severe TR with an RVSP of 31    Past Surgical History  Procedure Laterality Date  . Coronary artery bypass graft  06/22/2009    x5 -- left internal mammary artery to LAD, sequential saphenous vein graft to first diagonal and first obtuse marginal, sequential saphenous vein graft to posterior descending and obtuse marginal 2 (posterolateral), endoscopic vein harvest of righ leg  . Goiter resection    . Thyroidectomy  1970s  . Cataract extraction, bilateral    . Rectal abscess    . Replacement total knee bilateral      bilateral  . Cardiac catheterization  01/18/2011    Est. EF of 25% to 30 - Mildly reduced cardiac output with no signs of decompensated heart failure and normal right heart filling pressures -- Patent bypass grafts with underlying three-vessel coronary artery disease  . Cardiac catheterization  06/14/2009    Est. EF of of 45% - Multivessel coronary artery disease with high-grade lesions in the LAD, left circumflex, and RCA -- Mildly reduced left ventricular function -- No significant aortic stenosis or mitral regurgitation  . Pacemaker insertion  04/16/2011    biventricular pacemaker insertion  . Eye surgery      FAMHx:  Family History  Problem Relation Age of Onset  . Heart failure Brother 82  . Heart attack Brother 6  . Heart disease Son 25    Had CABG x 1  . Hypertension Son     SOCHx:   reports that she has  never smoked. She has never used smokeless tobacco. She reports that she does not drink alcohol or use illicit drugs.  ALLERGIES:  Allergies  Allergen Reactions  . Lisinopril     REACTION: Dry Cough    ROS: A comprehensive review of systems was negative except for: Cardiovascular: positive for dyspnea and fatigue Musculoskeletal: positive for back pain  HOME MEDS: Current Outpatient Prescriptions  Medication Sig Dispense Refill  . acetaminophen (TYLENOL) 325 MG tablet Take 650 mg by mouth every 6 (six) hours as needed. For pain         . aspirin 81 MG EC tablet Take 81 mg by mouth daily.       . carvedilol (COREG) 6.25 MG tablet Take 9.375 mg by mouth 2 (two) times daily with a meal.       . citalopram (CELEXA) 10 MG tablet Take 10 mg by mouth 2 (two) times daily.       Marland Kitchen DIOVAN 160 MG tablet Take 160 mg by mouth daily.       Tery Sanfilippo Calcium (STOOL SOFTENER PO) Take 2 capsules by mouth as needed.       . furosemide (LASIX) 20 MG tablet Take 20 mg by mouth daily.        Marland Kitchen glimepiride (AMARYL) 4 MG tablet Take 4 mg by mouth at bedtime.       Marland Kitchen HYDROcodone-acetaminophen (NORCO) 7.5-325 MG per tablet Take 1 tablet by mouth every 6 (six) hours as needed for pain.      . iron polysaccharides (NIFEREX) 150 MG capsule Take 1 capsule (150 mg total) by mouth 3 (three) times a week.  12 capsule  3  . KLOR-CON M20 20 MEQ tablet Take 20 mEq by mouth daily.       Marland Kitchen LORazepam (ATIVAN) 0.5 MG tablet Take 0.5 mg by mouth every 8 (eight) hours as needed. FOR NERVES      . metFORMIN (GLUCOPHAGE) 1000 MG tablet Take 1,000 mg by mouth 2 (two) times daily with a meal. Take 1 tablet in the morning and 1/2 a tablet in the evening       . Multiple Vitamins-Minerals (CENTRUM SILVER PO) Take 1 tablet by mouth daily.       Marland Kitchen NAMENDA 10 MG tablet Take 10 mg by mouth 2 (two) times daily.       . pravastatin (PRAVACHOL) 40 MG tablet Take 40 mg by mouth Daily.      . TRADJENTA 5 MG TABS tablet Take 5 mg by mouth daily. Take one-half tablet at night      . traMADol (ULTRAM) 50 MG tablet Take 50 mg by mouth as needed.       . VOLTAREN 1 % GEL Apply 1 application topically 3 (three) times daily as needed.       No current facility-administered medications for this visit.    LABS/IMAGING: No results found for this or any previous visit (from the past 48 hour(s)). No results found.  VITALS: BP 150/60  Pulse 75  Ht 5\' 5"  (1.651 m)  Wt 141 lb 9.6 oz (64.229 kg)  BMI 23.56 kg/m2  EXAM: General appearance: alert and no distress Neck: no adenopathy,  no carotid bruit, no JVD, supple, symmetrical, trachea midline and thyroid not enlarged, symmetric, no tenderness/mass/nodules Lungs: clear to auscultation bilaterally Heart: regular rate and rhythm, S1, S2 normal, no murmur, click, rub or gallop Abdomen: soft, non-tender; bowel sounds normal; no masses,  no organomegaly Extremities: extremities normal, atraumatic, no  cyanosis or edema Pulses: 2+ and symmetric Skin: Skin color, texture, turgor normal. No rashes or lesions Neurologic: Grossly normal  EKG: Biventricular paced rhythm with PACs at 75  ASSESSMENT: 1. Ischemic cardiomyopathy, EF 35%, status post biventricular pacing with New York Heart Association class III symptoms 2.   Hypertension 3.   Dyslipidemia 4.   Coronary artery disease 5.   low back pain  PLAN: 1.   Ms. Mcray is doing well from a cardiovascular standpoint. Understand she recently had a remote device check and is adequately biventricular pacing. She notes no change in her symptomatology with regards to heart failure symptoms which are class III. Unfortunately she is not as active duty to low back pain. She recently had back plain films which demonstrated severe degenerative disc and joint disease of the lumbar spine, scheduled to see a neurosurgeon. Hopefully she can be managed with injections physical therapy and or noninvasive measures. I think clearly she would be at increased risk for general anesthesia of surgery.  Of note her blood pressure is running slightly higher today. Her daughter had discontinued her Diovan due to low blood pressure at home. I feel that she should restart this medication as it is beneficial for her heart failure and blood pressure management.  Will plan to see her back in 6 months and likely repeat an echocardiogram at that time.  Chrystie Nose, MD, [facility] Attending Cardiologist [facility]  HILTY,Kenneth C 10/30/2012, 5:39 PM

## 2012-10-30 NOTE — Patient Instructions (Signed)
Dr. Rennis Golden would like you to wear compression stockings during the day - remove at night. R ankle 8 inch R calf 11.75 inch L ankle 7.5 inch Lcalf 11.75 inch #1  Please follow up with Dr. Rennis Golden in 6 months.

## 2012-11-05 ENCOUNTER — Encounter (HOSPITAL_COMMUNITY): Payer: Medicare Other | Attending: Oncology

## 2012-11-05 ENCOUNTER — Encounter (HOSPITAL_BASED_OUTPATIENT_CLINIC_OR_DEPARTMENT_OTHER): Payer: Medicare Other

## 2012-11-05 VITALS — BP 123/62 | HR 71

## 2012-11-05 DIAGNOSIS — D638 Anemia in other chronic diseases classified elsewhere: Secondary | ICD-10-CM

## 2012-11-05 DIAGNOSIS — C9110 Chronic lymphocytic leukemia of B-cell type not having achieved remission: Secondary | ICD-10-CM | POA: Insufficient documentation

## 2012-11-05 LAB — CBC
HCT: 32.4 % — ABNORMAL LOW (ref 36.0–46.0)
RBC: 3.52 MIL/uL — ABNORMAL LOW (ref 3.87–5.11)
RDW: 16 % — ABNORMAL HIGH (ref 11.5–15.5)
WBC: 22.4 10*3/uL — ABNORMAL HIGH (ref 4.0–10.5)

## 2012-11-05 MED ORDER — EPOETIN ALFA 40000 UNIT/ML IJ SOLN
40000.0000 [IU] | Freq: Once | INTRAMUSCULAR | Status: AC
  Administered 2012-11-05: 40000 [IU] via SUBCUTANEOUS

## 2012-11-05 MED ORDER — EPOETIN ALFA 40000 UNIT/ML IJ SOLN
INTRAMUSCULAR | Status: AC
  Filled 2012-11-05: qty 1

## 2012-11-05 NOTE — Progress Notes (Signed)
Labs drawn today for cbc 

## 2012-11-05 NOTE — Progress Notes (Signed)
Tolerated Procrit 40,000 units sub-q to lower right abd well.

## 2012-12-03 ENCOUNTER — Encounter (HOSPITAL_BASED_OUTPATIENT_CLINIC_OR_DEPARTMENT_OTHER): Payer: Medicare Other

## 2012-12-03 ENCOUNTER — Encounter (HOSPITAL_COMMUNITY): Payer: Medicare Other | Attending: Oncology

## 2012-12-03 DIAGNOSIS — C9110 Chronic lymphocytic leukemia of B-cell type not having achieved remission: Secondary | ICD-10-CM | POA: Insufficient documentation

## 2012-12-03 DIAGNOSIS — D638 Anemia in other chronic diseases classified elsewhere: Secondary | ICD-10-CM

## 2012-12-03 LAB — CBC
HCT: 34.8 % — ABNORMAL LOW (ref 36.0–46.0)
Hemoglobin: 11.4 g/dL — ABNORMAL LOW (ref 12.0–15.0)
MCH: 29.6 pg (ref 26.0–34.0)
RBC: 3.85 MIL/uL — ABNORMAL LOW (ref 3.87–5.11)

## 2012-12-03 NOTE — Progress Notes (Signed)
Labs drawn today for cbc 

## 2012-12-03 NOTE — Progress Notes (Signed)
Procrit not given, hemoglobin 11.4.

## 2012-12-08 ENCOUNTER — Other Ambulatory Visit: Payer: Self-pay | Admitting: *Deleted

## 2012-12-08 MED ORDER — PRAVASTATIN SODIUM 40 MG PO TABS: 40.0000 mg | ORAL_TABLET | Freq: Every day | ORAL | Status: DC

## 2012-12-15 ENCOUNTER — Telehealth: Payer: Self-pay | Admitting: Internal Medicine

## 2012-12-15 MED ORDER — PRAVASTATIN SODIUM 40 MG PO TABS: 40.0000 mg | ORAL_TABLET | Freq: Every day | ORAL | Status: AC

## 2012-12-15 NOTE — Telephone Encounter (Signed)
Returned call.  Bad connection.  Person answering stated she will call back.  Anne [name], pt's daughter, called back.  Requested 90-day supply on Pravastatin.  Reviewed chart and last OV 6.5.14 and refill 7.14.14.  Informed pharmacy will be notified to refill next at 3-mo supply.  Verbalized understanding and agreed w/ plan.  Refill(s) sent to pharmacy w/ updated instructions and note to pharmacy.

## 2012-12-15 NOTE — Telephone Encounter (Signed)
Anne [name] would like a 90 day supply 3 refills of f her Prevastatin 40 mg called to [facility] in [place], [place] [phone].

## 2013-01-01 ENCOUNTER — Other Ambulatory Visit (HOSPITAL_COMMUNITY): Payer: Medicare Other

## 2013-01-05 ENCOUNTER — Encounter: Payer: Self-pay | Admitting: Internal Medicine

## 2013-01-05 ENCOUNTER — Ambulatory Visit (INDEPENDENT_AMBULATORY_CARE_PROVIDER_SITE_OTHER): Payer: Medicare Other | Admitting: *Deleted

## 2013-01-05 DIAGNOSIS — Z950 Presence of cardiac pacemaker: Secondary | ICD-10-CM

## 2013-01-05 DIAGNOSIS — I2589 Other forms of chronic ischemic heart disease: Secondary | ICD-10-CM

## 2013-01-05 DIAGNOSIS — I255 Ischemic cardiomyopathy: Secondary | ICD-10-CM

## 2013-01-06 ENCOUNTER — Encounter (HOSPITAL_BASED_OUTPATIENT_CLINIC_OR_DEPARTMENT_OTHER): Payer: Medicare Other

## 2013-01-06 ENCOUNTER — Encounter (HOSPITAL_COMMUNITY): Payer: Medicare Other | Attending: Oncology

## 2013-01-06 VITALS — BP 116/67 | HR 77 | Resp 16

## 2013-01-06 DIAGNOSIS — D638 Anemia in other chronic diseases classified elsewhere: Secondary | ICD-10-CM

## 2013-01-06 DIAGNOSIS — C9110 Chronic lymphocytic leukemia of B-cell type not having achieved remission: Secondary | ICD-10-CM | POA: Insufficient documentation

## 2013-01-06 LAB — CBC
HCT: 29.5 % — ABNORMAL LOW (ref 36.0–46.0)
MCV: 91.6 fL (ref 78.0–100.0)
RBC: 3.22 MIL/uL — ABNORMAL LOW (ref 3.87–5.11)
RDW: 15.1 % (ref 11.5–15.5)
WBC: 26.8 10*3/uL — ABNORMAL HIGH (ref 4.0–10.5)

## 2013-01-06 MED ORDER — EPOETIN ALFA 40000 UNIT/ML IJ SOLN
INTRAMUSCULAR | Status: AC
  Filled 2013-01-06: qty 1

## 2013-01-06 MED ORDER — EPOETIN ALFA 40000 UNIT/ML IJ SOLN
40000.0000 [IU] | Freq: Once | INTRAMUSCULAR | Status: AC
  Administered 2013-01-06: 40000 [IU] via SUBCUTANEOUS

## 2013-01-06 NOTE — Progress Notes (Signed)
Anne [name] presents today for injection per MD orders. Procrit 40,000 units administered SQ in left Abdomen. Administration without incident. Patient tolerated well.  

## 2013-01-06 NOTE — Progress Notes (Signed)
Labs drawn today for cbc 

## 2013-01-07 LAB — REMOTE PACEMAKER DEVICE
AL THRESHOLD: 1.125 V
ATRIAL PACING PM: 1
BAMS-0001: 150 {beats}/min
BAMS-0003: 70 {beats}/min
RV LEAD THRESHOLD: 1 V

## 2013-01-13 ENCOUNTER — Encounter (HOSPITAL_BASED_OUTPATIENT_CLINIC_OR_DEPARTMENT_OTHER): Payer: Medicare Other

## 2013-01-13 ENCOUNTER — Encounter (HOSPITAL_COMMUNITY): Payer: Self-pay

## 2013-01-13 VITALS — BP 128/61 | HR 83 | Temp 97.8°F | Resp 18 | Wt 137.8 lb

## 2013-01-13 DIAGNOSIS — C9110 Chronic lymphocytic leukemia of B-cell type not having achieved remission: Secondary | ICD-10-CM

## 2013-01-13 DIAGNOSIS — R5381 Other malaise: Secondary | ICD-10-CM

## 2013-01-13 DIAGNOSIS — D649 Anemia, unspecified: Secondary | ICD-10-CM

## 2013-01-13 NOTE — Patient Instructions (Addendum)
.  [facility] Discharge Instructions  RECOMMENDATIONS MADE BY THE CONSULTANT AND ANY TEST RESULTS WILL BE SENT TO YOUR REFERRING PHYSICIAN.  EXAM FINDINGS BY THE PHYSICIAN TODAY AND SIGNS OR SYMPTOMS TO REPORT TO CLINIC OR PRIMARY PHYSICIAN: Dr. Beckie Busing discussed new treatment for your disease.  Anne [name] will call you and set up a time for teaching you about the new drugs. One will be a pill and the other will be IV   Thank you for choosing [facility] to provide your oncology and hematology care.  To afford each patient quality time with our providers, please arrive at least 15 minutes before your scheduled appointment time.  With your help, our goal is to use those 15 minutes to complete the necessary work-up to ensure our physicians have the information they need to help with your evaluation and healthcare recommendations.    Effective January 1st, 2014, we ask that you re-schedule your appointment with our physicians should you arrive 10 or more minutes late for your appointment.  We strive to give you quality time with our providers, and arriving late affects you and other patients whose appointments are after yours.    Again, thank you for choosing [facility].  Our hope is that these requests will decrease the amount of time that you wait before being seen by our physicians.       _____________________________________________________________  Should you have questions after your visit to [facility], please contact our office at [phone] between the hours of 8:30 a.m. and 5:00 p.m.  Voicemails left after 4:30 p.m. will not be returned until the following business day.  For prescription refill requests, have your pharmacy contact our office with your prescription refill request.

## 2013-01-13 NOTE — Progress Notes (Signed)
[facility] Telephone:([phone]) [phone]   Fax:([phone]) [phone]  OFFICE PROGRESS NOTE  Catalina Pizza, MD  [address] [place] [address]  DIAGNOSIS:  1.CLL 2.Anemia.    INTERVAL HISTORY:   Anne [name] 79 y.o. female returns to the clinic today for scheduled follow up.  She is presently on Procrit every 4 weeks which she has had for a protracted length of time at the least this dates back to 12/2010. She has also had a protracted course of anemia which at least dates back to 2008. From his records patient has not been treated for CLL. She complains of increasing fatigue and sleepiness but denied night sweats, unexplained fever or chills.  Denies any easy easy bruising or bleeding. She tells me that she is eating well  and hasn't noticed any lymphadenopathy. Over the last 6 months patient's weight has been fairly stable.  MEDICAL HISTORY: Past Medical History  Diagnosis Date  . CLL (chronic lymphocytic leukemia)     stable  . Anemia   . DM (diabetes mellitus)   . Hypercholesteremia   . DJD (degenerative joint disease) of knee     bilat  . Hypertension   . Coronary artery disease     three-vessel CAD with CABG -- x5 -- left internal mammary artery to LAD, sequential saphenous vein graft to first diagonal and first obtuse marginal, sequential saphenous vein graft to posterior descending and obtuse marginal 2 (posterolateral), endoscopic vein harvest of righ leg  . Shortness of breath   . CHF (congestive heart failure)     [place] Class 3 Heart Failure  . Blood transfusion   . CLL (chronic lymphocytic leukemia)   . CLL (chronic lymphocytic leukemia) 04/03/2007    Stage 0 CLL    . Anemia of chronic disease 10/30/2011  . Ischemic cardiomyopathy     EF of 35% on 2D Echo done 04/07/12 moderate intraventricular dyssynchrony, moderate MR and moderate to severe TR with an RVSP of 31    ALLERGIES:  is allergic to lisinopril.  MEDICATIONS: Reviewed.  SURGICAL HISTORY:    Past Surgical History  Procedure Laterality Date  . Coronary artery bypass graft  06/22/2009    x5 -- left internal mammary artery to LAD, sequential saphenous vein graft to first diagonal and first obtuse marginal, sequential saphenous vein graft to posterior descending and obtuse marginal 2 (posterolateral), endoscopic vein harvest of righ leg  . Goiter resection    . Thyroidectomy  1970s  . Cataract extraction, bilateral    . Rectal abscess    . Replacement total knee bilateral      bilateral  . Cardiac catheterization  01/18/2011    Est. EF of 25% to 30 - Mildly reduced cardiac output with no signs of decompensated heart failure and normal right heart filling pressures -- Patent bypass grafts with underlying three-vessel coronary artery disease  . Cardiac catheterization  06/14/2009    Est. EF of of 45% - Multivessel coronary artery disease with high-grade lesions in the LAD, left circumflex, and RCA -- Mildly reduced left ventricular function -- No significant aortic stenosis or mitral regurgitation  . Pacemaker insertion  04/16/2011    biventricular pacemaker insertion  . Eye surgery       REVIEW OF SYSTEMS: 14 point review of system is as in the history above otherwise negative.  PHYSICAL EXAMINATION:  Blood pressure 128/61, pulse 83, temperature 97.8 F (36.6 C), temperature source Oral, resp. rate 18,  weight 137 lb 12.8 oz (62.506 kg). GENERAL: No acute distress. Elderly woman. SKIN:  No rashes or significant lesions . No ecchymosis or petechial rash. HEAD: Normocephalic, No masses, lesions, tenderness or abnormalities  EYES: Conjunctiva are pink and non-injected and no jaundice ENT: External ears normal ,lips, buccal mucosa, and tongue normal and mucous membranes are moist . No evidence of thrush. LYMPH: No palpable lymphadenopathy, in the neck, supraclavicular areas or axilla. LUNGS: Clear to auscultation , no crackles or wheezes HEART: regular rate & rhythm, no murmurs,  no gallops, S1 normal and S2 normal and no S3. ABDOMEN: Abdomen soft, non-tender, no masses or organomegaly and no hepatosplenomegaly palpable EXTREMITIES: No edema, no skin discoloration or tenderness NEURO: Alert & oriented , no focal motor  deficits.     LABORATORY DATA: Lab Results  Component Value Date   WBC 26.8* 01/06/2013   HGB 9.5* 01/06/2013   HCT 29.5* 01/06/2013   MCV 91.6 01/06/2013   PLT 195 01/06/2013      Chemistry      Component Value Date/Time   NA 132* 01/10/2012 1100   K 4.6 01/10/2012 1100   CL 97 01/10/2012 1100   CO2 28 01/10/2012 1100   BUN 30* 01/10/2012 1100   CREATININE 1.22* 01/10/2012 1100   CREATININE 1.07 04/10/2011 1027      Component Value Date/Time   CALCIUM 9.3 01/10/2012 1100   ALKPHOS 59 01/10/2012 1100   AST 24 01/10/2012 1100   ALT 12 01/10/2012 1100   BILITOT 0.3 01/10/2012 1100       RADIOGRAPHIC STUDIES: No results found.   ASSESSMENT:  Ms. [name] has CLL and anemia.  Cytopenia is one of the treatment indications in CLL besides she also has a profound fatigue has been sleeping a lot lately.  I feel that the case can be made for  treatment of her CLL especially considering that we have an highly effective regimens with the minimal toxicity especially in the apical with multiple co morbidities. My preference in this case will be combination of Obinutuzumab  and chlorambucil based on n engl j med 370;12 nejm.org August 14, 2012 published trial result. Patient agrees to be treated.   PLAN:  1. Start treatment in 1-2 weeks.Chlorambucil will be administered orally at a dose of 0.5 mg per kilogram of body weight on days 1 and 15 of each cycle and Obinutuzumab  Will be administered intravenously at a dose of 1000 mg on days 1, 8, and 15 of cycle 1 and on day 1 of cycles 2 through 6  Every 28 days. 2. We will watch Uric acid very closely before on immediately after cycle 1. 3. Return to clinic one week after chemotherapy to assess toxicity issues. 4.  Chemotherapy education 5. I would recommend stopping ESA given tendency to cause adverse outcome in  malignancies. .   All questions were satisfactorily answered. Patient knows to call if  any concern arises.  I spent more than 50 % counseling the patient face to face. The total time spent in the appointment was 30 minutes.   Sherral Hammers, MD FACP. Hematology/Oncology.

## 2013-01-28 ENCOUNTER — Encounter: Payer: Self-pay | Admitting: *Deleted

## 2013-01-28 HISTORY — PX: OTHER SURGICAL HISTORY: SHX169

## 2013-01-30 ENCOUNTER — Telehealth (HOSPITAL_COMMUNITY): Payer: Self-pay | Admitting: Oncology

## 2013-01-30 NOTE — Telephone Encounter (Signed)
Per benefit investigation done by [employer]  pts plan will charge a $50 copy for all part b drugs and once the oop is met the copay will be waived. Pt has a $2600 oop and as of 01/28/13 $ 441.00 has been met  Anne [name] Medical Oncology [phone]

## 2013-02-02 ENCOUNTER — Other Ambulatory Visit (HOSPITAL_COMMUNITY): Payer: Self-pay | Admitting: Hematology and Oncology

## 2013-02-03 ENCOUNTER — Other Ambulatory Visit (HOSPITAL_COMMUNITY): Payer: Medicare Other

## 2013-02-04 ENCOUNTER — Other Ambulatory Visit (HOSPITAL_COMMUNITY): Payer: Self-pay | Admitting: Oncology

## 2013-02-04 ENCOUNTER — Encounter (HOSPITAL_COMMUNITY): Payer: Medicare Other | Attending: Oncology

## 2013-02-04 ENCOUNTER — Encounter (HOSPITAL_COMMUNITY): Payer: Medicare Other

## 2013-02-04 DIAGNOSIS — C9110 Chronic lymphocytic leukemia of B-cell type not having achieved remission: Secondary | ICD-10-CM

## 2013-02-04 DIAGNOSIS — D638 Anemia in other chronic diseases classified elsewhere: Secondary | ICD-10-CM

## 2013-02-04 LAB — CBC WITH DIFFERENTIAL/PLATELET
Basophils Absolute: 0 10*3/uL (ref 0.0–0.1)
HCT: 33.9 % — ABNORMAL LOW (ref 36.0–46.0)
Hemoglobin: 11.1 g/dL — ABNORMAL LOW (ref 12.0–15.0)
Lymphocytes Relative: 91 % — ABNORMAL HIGH (ref 12–46)
Monocytes Absolute: 0.3 10*3/uL (ref 0.1–1.0)
Neutro Abs: 1.5 10*3/uL — ABNORMAL LOW (ref 1.7–7.7)
RDW: 15.5 % (ref 11.5–15.5)
WBC: 20.3 10*3/uL — ABNORMAL HIGH (ref 4.0–10.5)

## 2013-02-04 LAB — COMPREHENSIVE METABOLIC PANEL
AST: 30 U/L (ref 0–37)
Albumin: 3.7 g/dL (ref 3.5–5.2)
Calcium: 10.6 mg/dL — ABNORMAL HIGH (ref 8.4–10.5)
Creatinine, Ser: 1.42 mg/dL — ABNORMAL HIGH (ref 0.50–1.10)

## 2013-02-04 LAB — URIC ACID: Uric Acid, Serum: 8.6 mg/dL — ABNORMAL HIGH (ref 2.4–7.0)

## 2013-02-04 MED ORDER — ALLOPURINOL 300 MG PO TABS: 300.0000 mg | ORAL_TABLET | Freq: Two times a day (BID) | ORAL | Status: DC

## 2013-02-04 NOTE — Progress Notes (Signed)
Labs drawn today for cbc/diff,cmp,ldh,uric acid 

## 2013-02-04 NOTE — Progress Notes (Signed)
HGB 11.1 no procrit today

## 2013-02-11 ENCOUNTER — Encounter (HOSPITAL_BASED_OUTPATIENT_CLINIC_OR_DEPARTMENT_OTHER): Payer: Medicare Other

## 2013-02-11 VITALS — BP 127/56 | HR 67 | Resp 16

## 2013-02-11 DIAGNOSIS — C9110 Chronic lymphocytic leukemia of B-cell type not having achieved remission: Secondary | ICD-10-CM

## 2013-02-11 DIAGNOSIS — D638 Anemia in other chronic diseases classified elsewhere: Secondary | ICD-10-CM

## 2013-02-11 LAB — BASIC METABOLIC PANEL
BUN: 26 mg/dL — ABNORMAL HIGH (ref 6–23)
Chloride: 98 mEq/L (ref 96–112)
GFR calc Af Amer: 49 mL/min — ABNORMAL LOW (ref >90)
Potassium: 5 mEq/L (ref 3.5–5.1)
Sodium: 131 mEq/L — ABNORMAL LOW (ref 135–145)

## 2013-02-11 LAB — CBC WITH DIFFERENTIAL/PLATELET
Basophils Relative: 0 % (ref 0–1)
Eosinophils Relative: 0 % (ref 0–5)
HCT: 31.8 % — ABNORMAL LOW (ref 36.0–46.0)
Hemoglobin: 10.1 g/dL — ABNORMAL LOW (ref 12.0–15.0)
Lymphocytes Relative: 86 % — ABNORMAL HIGH (ref 12–46)
MCHC: 31.8 g/dL (ref 30.0–36.0)
Monocytes Relative: 2 % — ABNORMAL LOW (ref 3–12)
Neutro Abs: 2.6 10*3/uL (ref 1.7–7.7)
RBC: 3.43 MIL/uL — ABNORMAL LOW (ref 3.87–5.11)
WBC: 21.8 10*3/uL — ABNORMAL HIGH (ref 4.0–10.5)

## 2013-02-11 MED ORDER — EPOETIN ALFA 40000 UNIT/ML IJ SOLN
INTRAMUSCULAR | Status: AC
  Filled 2013-02-11: qty 1

## 2013-02-11 MED ORDER — EPOETIN ALFA 40000 UNIT/ML IJ SOLN
40000.0000 [IU] | Freq: Once | INTRAMUSCULAR | Status: AC
  Administered 2013-02-11: 40000 [IU] via SUBCUTANEOUS

## 2013-02-11 NOTE — Progress Notes (Signed)
Labs drawn today for cbc/diff,bmp,uric acid

## 2013-02-11 NOTE — Progress Notes (Signed)
Anne [name] presents today for injection per MD orders. Procrit 40,000 units administered SQ in left Abdomen. Administration without incident. Patient tolerated well.  

## 2013-02-16 ENCOUNTER — Inpatient Hospital Stay (HOSPITAL_COMMUNITY): Payer: Medicare Other

## 2013-02-17 ENCOUNTER — Inpatient Hospital Stay (HOSPITAL_COMMUNITY): Payer: Medicare Other

## 2013-02-18 ENCOUNTER — Encounter (HOSPITAL_BASED_OUTPATIENT_CLINIC_OR_DEPARTMENT_OTHER): Payer: Medicare Other

## 2013-02-18 ENCOUNTER — Ambulatory Visit (HOSPITAL_COMMUNITY): Payer: Medicare Other

## 2013-02-18 ENCOUNTER — Encounter (HOSPITAL_COMMUNITY): Payer: Self-pay

## 2013-02-18 ENCOUNTER — Ambulatory Visit (HOSPITAL_COMMUNITY): Payer: Medicare Other | Admitting: Oncology

## 2013-02-18 VITALS — BP 143/70 | HR 83 | Temp 97.3°F | Resp 16 | Wt 139.3 lb

## 2013-02-18 DIAGNOSIS — D638 Anemia in other chronic diseases classified elsewhere: Secondary | ICD-10-CM

## 2013-02-18 DIAGNOSIS — D631 Anemia in chronic kidney disease: Secondary | ICD-10-CM

## 2013-02-18 DIAGNOSIS — N189 Chronic kidney disease, unspecified: Secondary | ICD-10-CM

## 2013-02-18 DIAGNOSIS — N039 Chronic nephritic syndrome with unspecified morphologic changes: Secondary | ICD-10-CM

## 2013-02-18 DIAGNOSIS — C9110 Chronic lymphocytic leukemia of B-cell type not having achieved remission: Secondary | ICD-10-CM

## 2013-02-18 LAB — CBC
HCT: 31.3 % — ABNORMAL LOW (ref 36.0–46.0)
Hemoglobin: 10 g/dL — ABNORMAL LOW (ref 12.0–15.0)
MCH: 29.9 pg (ref 26.0–34.0)
MCV: 93.4 fL (ref 78.0–100.0)
RBC: 3.35 MIL/uL — ABNORMAL LOW (ref 3.87–5.11)
WBC: 23.2 10*3/uL — ABNORMAL HIGH (ref 4.0–10.5)

## 2013-02-18 MED ORDER — EPOETIN ALFA 40000 UNIT/ML IJ SOLN
40000.0000 [IU] | Freq: Once | INTRAMUSCULAR | Status: AC
  Administered 2013-02-18: 40000 [IU] via SUBCUTANEOUS
  Filled 2013-02-18: qty 1

## 2013-02-18 NOTE — Patient Instructions (Addendum)
[facility] Discharge Instructions  RECOMMENDATIONS MADE BY THE CONSULTANT AND ANY TEST RESULTS WILL BE SENT TO YOUR REFERRING PHYSICIAN.  EXAM FINDINGS BY THE PHYSICIAN TODAY AND SIGNS OR SYMPTOMS TO REPORT TO CLINIC OR PRIMARY PHYSICIAN: Exam and findings as discussed by Dr. Zigmund Daniel.  MD does not feel that you need any chemotherapy.  We will check your labs today and if your hemoglobin is below 11 grams we will give your procrit injection today.  Your hemoglobin is 10 grams and we will give you procrit today.  MEDICATIONS PRESCRIBED:  none  INSTRUCTIONS/FOLLOW-UP: Follow-up in 2 weeks with blood work and MD visit.  Thank you for choosing [facility] to provide your oncology and hematology care.  To afford each patient quality time with our providers, please arrive at least 15 minutes before your scheduled appointment time.  With your help, our goal is to use those 15 minutes to complete the necessary work-up to ensure our physicians have the information they need to help with your evaluation and healthcare recommendations.    Effective January 1st, 2014, we ask that you re-schedule your appointment with our physicians should you arrive 10 or more minutes late for your appointment.  We strive to give you quality time with our providers, and arriving late affects you and other patients whose appointments are after yours.    Again, thank you for choosing [facility].  Our hope is that these requests will decrease the amount of time that you wait before being seen by our physicians.       _____________________________________________________________  Should you have questions after your visit to [facility], please contact our office at [phone] between the hours of 8:30 a.m. and 5:00 p.m.  Voicemails left after 4:30 p.m. will not be returned until the following business day.  For prescription refill requests, have your pharmacy  contact our office with your prescription refill request.

## 2013-02-18 NOTE — Progress Notes (Signed)
Anne [name] presented for [employer]. Labs per MD order drawn via Peripheral Line 23 gauge needle inserted in left AC  Good blood return present. Procedure without incident.  Needle removed intact. Patient tolerated procedure well.  Anne [name] presents today for injection per MD orders. Procrit 16109 units administered SQ in right Abdomen. Administration without incident. Patient tolerated well.

## 2013-02-18 NOTE — Progress Notes (Signed)
[facility] OFFICE PROGRESS NOTE  [place], Ian Malkin, MD  [address] [place] [address]  DIAGNOSIS: Anemia of chronic disease - Plan: CBC, epoetin alfa (EPOGEN,PROCRIT) injection 40,000 Units  CLL (chronic lymphocytic leukemia) - Plan: CBC  Chief Complaint  Patient presents with  . Leukemia    CURRENT THERAPY: Procrit every 2 weeks to maintain hemoglobin close to 11.  INTERVAL HISTORY: Anne [name] 79 y.o. female returns for followup of chronic lymphocytic leukemia with a possible intent to institute treatment. Her primary problem is pain in the left knee due to degenerative joint disease. She denies any worsening fatigue, fever, night sweats, easy satiety, anorexia, abnormalities of constipation or diarrhea, skin rash, or joint pain elsewhere. She has noticed some increased varicosity in the left lower extremity below the knee with small petechiae. She also bruises easily in both upper extremities. She does take aspirin.   MEDICAL HISTORY: Past Medical History  Diagnosis Date  . CLL (chronic lymphocytic leukemia)     stable  . Anemia   . DM (diabetes mellitus)   . Hypercholesteremia   . DJD (degenerative joint disease) of knee     bilat  . Hypertension   . Coronary artery disease     three-vessel CAD with CABG -- x5 -- left internal mammary artery to LAD, sequential saphenous vein graft to first diagonal and first obtuse marginal, sequential saphenous vein graft to posterior descending and obtuse marginal 2 (posterolateral), endoscopic vein harvest of righ leg  . Shortness of breath   . CHF (congestive heart failure)     [place] Class 3 Heart Failure  . Blood transfusion   . CLL (chronic lymphocytic leukemia)   . CLL (chronic lymphocytic leukemia) 04/03/2007    Stage 0 CLL    . Anemia of chronic disease 10/30/2011  . Ischemic cardiomyopathy     EF of 35% on 2D Echo done 04/07/12 moderate intraventricular dyssynchrony, moderate MR and moderate to severe TR with an  RVSP of 31    INTERIM HISTORY: has CLL (chronic lymphocytic leukemia); HYPOTHYROIDISM; DIABETES MELLITUS, TYPE II, UNCONTROLLED; HYPERLIPIDEMIA; DEPRESSION; CATARACTS; HYPERTENSION; ARTHRITIS; LOW BACK PAIN, CHRONIC; BURSITIS, ACROMIOCLAVICULAR, LEFT; MEMORY LOSS; FECAL INCONTINENCE; Ischemic cardiomyopathy; Chronic systolic dysfunction of left ventricle; Anemia of chronic disease; and Pacemaker-[employer] on her problem list.    ALLERGIES:  is allergic to lisinopril.  MEDICATIONS: has a current medication list which includes the following prescription(s): aspirin, carvedilol, citalopram, diovan, docusate calcium, donepezil hcl, furosemide, glimepiride, hydrocodone-acetaminophen, lorazepam, metformin, multiple vitamins-minerals, namenda, pravastatin, tradjenta, tramadol, voltaren, acetaminophen, allopurinol, and klor-con m20.  SURGICAL HISTORY:  Past Surgical History  Procedure Laterality Date  . Coronary artery bypass graft  06/22/2009    x5 -- left internal mammary artery to LAD, sequential saphenous vein graft to first diagonal and first obtuse marginal, sequential saphenous vein graft to posterior descending and obtuse marginal 2 (posterolateral), endoscopic vein harvest of righ leg  . Goiter resection    . Thyroidectomy  1970s  . Cataract extraction, bilateral    . Rectal abscess    . Replacement total knee bilateral      bilateral  . Cardiac catheterization  01/18/2011    Est. EF of 25% to 30 - Mildly reduced cardiac output with no signs of decompensated heart failure and normal right heart filling pressures -- Patent bypass grafts with underlying three-vessel coronary artery disease  . Cardiac catheterization  06/14/2009    Est. EF of of 45% - Multivessel coronary artery disease with  high-grade lesions in the LAD, left circumflex, and RCA -- Mildly reduced left ventricular function -- No significant aortic stenosis or mitral regurgitation  . Pacemaker insertion  04/16/2011     biventricular pacemaker insertion  . Eye surgery    . Steroid injections to sacral spine 01/28/13 - dr Murray Hodgkins  01/28/13    FAMILY HISTORY: family history includes Heart attack (age of onset: 62) in her brother; Heart disease (age of onset: 86) in her son; Heart failure (age of onset: 22) in her brother; Hypertension in her son.  SOCIAL HISTORY:  reports that she has never smoked. She has never used smokeless tobacco. She reports that she does not drink alcohol or use illicit drugs.  REVIEW OF SYSTEMS:  Other than that discussed above is noncontributory.  PHYSICAL EXAMINATION: ECOG PERFORMANCE STATUS: 0 - Asymptomatic  Blood pressure 143/70, pulse 83, temperature 97.3 F (36.3 C), temperature source Oral, resp. rate 16, weight 139 lb 4.8 oz (63.186 kg).  GENERAL:alert, no distress and comfortable SKIN: skin color, texture, turgor are normal, no rashes or significant lesions EYES: normal, Conjunctiva are pink and non-injected, sclera clear OROPHARYNX:no exudate, no erythema and lips, buccal mucosa, and tongue normal  NECK: supple, thyroid normal size, non-tender, without nodularity CHEST: No breast masses. LYMPH:  no palpable lymphadenopathy in the cervical, axillary or inguinal LUNGS: clear to auscultation and percussion with normal breathing effort HEART: regular rate & rhythm and no murmurs and no lower extremity edema ABDOMEN:abdomen soft, non-tender and normal bowel sounds Musculoskeletal:no cyanosis of digits and no clubbing. Ecchymoses noted above upper extremities. Left lower extremity with superficial varicosities that are nontender. Small petechiae also noted  NEURO: alert & oriented x 3 with fluent speech, no focal motor/sensory deficits   LABORATORY DATA: Office Visit on 02/18/2013  Component Date Value Range Status  . WBC 02/18/2013 23.2* 4.0 - 10.5 K/uL Final  . RBC 02/18/2013 3.35* 3.87 - 5.11 MIL/uL Final  . Hemoglobin 02/18/2013 10.0* 12.0 - 15.0 g/dL Final  . HCT  16/02/9603 31.3* 36.0 - 46.0 % Final  . MCV 02/18/2013 93.4  78.0 - 100.0 fL Final  . MCH 02/18/2013 29.9  26.0 - 34.0 pg Final  . MCHC 02/18/2013 31.9  30.0 - 36.0 g/dL Final  . RDW 54/01/8118 15.8* 11.5 - 15.5 % Final  . Platelets 02/18/2013 190  150 - 400 K/uL Final  Infusion on 02/11/2013  Component Date Value Range Status  . Sodium 02/11/2013 131* 135 - 145 mEq/L Final  . Potassium 02/11/2013 5.0  3.5 - 5.1 mEq/L Final  . Chloride 02/11/2013 98  96 - 112 mEq/L Final  . CO2 02/11/2013 24  19 - 32 mEq/L Final  . Glucose, Bld 02/11/2013 351* 70 - 99 mg/dL Final  . BUN 14/78/2956 26* 6 - 23 mg/dL Final  . Creatinine, Ser 02/11/2013 1.17* 0.50 - 1.10 mg/dL Final  . Calcium 21/30/8657 9.3  8.4 - 10.5 mg/dL Final  . GFR calc non Af Amer 02/11/2013 42* >90 mL/min Final  . GFR calc Af Amer 02/11/2013 49* >90 mL/min Final   Comment: (NOTE)                          The eGFR has been calculated using the CKD EPI equation.                          This calculation has not been validated in all clinical situations.  eGFR's persistently <90 mL/min signify possible Chronic Kidney                          Disease.  . WBC 02/11/2013 21.8* 4.0 - 10.5 K/uL Final  . RBC 02/11/2013 3.43* 3.87 - 5.11 MIL/uL Final  . Hemoglobin 02/11/2013 10.1* 12.0 - 15.0 g/dL Final  . HCT 40/98/1191 31.8* 36.0 - 46.0 % Final  . MCV 02/11/2013 92.7  78.0 - 100.0 fL Final  . MCH 02/11/2013 29.4  26.0 - 34.0 pg Final  . MCHC 02/11/2013 31.8  30.0 - 36.0 g/dL Final  . RDW 47/82/9562 14.9  11.5 - 15.5 % Final  . Platelets 02/11/2013 201  150 - 400 K/uL Final  . Neutrophils Relative % 02/11/2013 12* 43 - 77 % Final  . Lymphocytes Relative 02/11/2013 86* 12 - 46 % Final  . Monocytes Relative 02/11/2013 2* 3 - 12 % Final  . Eosinophils Relative 02/11/2013 0  0 - 5 % Final  . Basophils Relative 02/11/2013 0  0 - 1 % Final  . Neutro Abs 02/11/2013 2.6  1.7 - 7.7 K/uL Final  . Lymphs Abs 02/11/2013  18.8* 0.7 - 4.0 K/uL Final  . Monocytes Absolute 02/11/2013 0.4  0.1 - 1.0 K/uL Final  . Eosinophils Absolute 02/11/2013 0.0  0.0 - 0.7 K/uL Final  . Basophils Absolute 02/11/2013 0.0  0.0 - 0.1 K/uL Final  . Uric Acid, Serum 02/11/2013 6.6  2.4 - 7.0 mg/dL Final  Infusion on 13/12/6576  Component Date Value Range Status  . Sodium 02/04/2013 136  135 - 145 mEq/L Final  . Potassium 02/04/2013 5.0  3.5 - 5.1 mEq/L Final  . Chloride 02/04/2013 99  96 - 112 mEq/L Final  . CO2 02/04/2013 26  19 - 32 mEq/L Final  . Glucose, Bld 02/04/2013 200* 70 - 99 mg/dL Final  . BUN 46/96/2952 40* 6 - 23 mg/dL Final  . Creatinine, Ser 02/04/2013 1.42* 0.50 - 1.10 mg/dL Final  . Calcium 84/13/2440 10.6* 8.4 - 10.5 mg/dL Final  . Total Protein 02/04/2013 7.1  6.0 - 8.3 g/dL Final  . Albumin 03/24/2535 3.7  3.5 - 5.2 g/dL Final  . AST 64/40/3474 30  0 - 37 U/L Final  . ALT 02/04/2013 23  0 - 35 U/L Final  . Alkaline Phosphatase 02/04/2013 91  39 - 117 U/L Final  . Total Bilirubin 02/04/2013 0.4  0.3 - 1.2 mg/dL Final  . GFR calc non Af Amer 02/04/2013 33* >90 mL/min Final  . GFR calc Af Amer 02/04/2013 39* >90 mL/min Final   Comment: (NOTE)                          The eGFR has been calculated using the CKD EPI equation.                          This calculation has not been validated in all clinical situations.                          eGFR's persistently <90 mL/min signify possible Chronic Kidney                          Disease.  Marland Kitchen LDH 02/04/2013 189  94 - 250 U/L Final  . Uric Acid, Serum 02/04/2013 8.6* 2.4 -  7.0 mg/dL Final  . WBC 56/21/3086 20.3* 4.0 - 10.5 K/uL Final  . RBC 02/04/2013 3.66* 3.87 - 5.11 MIL/uL Final  . Hemoglobin 02/04/2013 11.1* 12.0 - 15.0 g/dL Final  . HCT 57/84/6962 33.9* 36.0 - 46.0 % Final  . MCV 02/04/2013 92.6  78.0 - 100.0 fL Final  . MCH 02/04/2013 30.3  26.0 - 34.0 pg Final  . MCHC 02/04/2013 32.7  30.0 - 36.0 g/dL Final  . RDW 95/28/4132 15.5  11.5 - 15.5 % Final   . Platelets 02/04/2013 205  150 - 400 K/uL Final  . Neutrophils Relative % 02/04/2013 8* 43 - 77 % Final  . Neutro Abs 02/04/2013 1.5* 1.7 - 7.7 K/uL Final  . Lymphocytes Relative 02/04/2013 91* 12 - 46 % Final  . Lymphs Abs 02/04/2013 18.4* 0.7 - 4.0 K/uL Final  . Monocytes Relative 02/04/2013 1* 3 - 12 % Final  . Monocytes Absolute 02/04/2013 0.3  0.1 - 1.0 K/uL Final  . Eosinophils Relative 02/04/2013 1  0 - 5 % Final  . Eosinophils Absolute 02/04/2013 0.1  0.0 - 0.7 K/uL Final  . Basophils Relative 02/04/2013 0  0 - 1 % Final  . Basophils Absolute 02/04/2013 0.0  0.0 - 0.1 K/uL Final  . WBC Morphology 02/04/2013 ABSOLUTE LYMPHOCYTOSIS   Final   Comment: ATYPICAL LYMPHOCYTES                          SMUDGE CELLS     Urinalysis    Component Value Date/Time   COLORURINE YELLOW 06/14/2009 0838   APPEARANCEUR CLOUDY* 06/14/2009 0838   LABSPEC 1.010 06/14/2009 0838   PHURINE 6.0 06/14/2009 0838   GLUCOSEU NEGATIVE 06/14/2009 0838   HGBUR NEGATIVE 06/14/2009 0838   BILIRUBINUR NEGATIVE 06/14/2009 0838   KETONESUR NEGATIVE 06/14/2009 0838   PROTEINUR NEGATIVE 06/14/2009 0838   UROBILINOGEN 0.2 06/14/2009 0838   NITRITE POSITIVE* 06/14/2009 0838   LEUKOCYTESUR LARGE* 06/14/2009 0838    RADIOGRAPHIC STUDIES: No results found.  ASSESSMENT: #1. Chronic lymphocytic leukemia, stable, and no need an intervention with therapy. #2. Anemia of chronic disease in the setting of chronic renal failure and erythropoietin deficiency.   PLAN: #1 no treatment is recommended for her chronic lymphocytic leukemia. #2. Procrit 40,000 units subcutaneously today. #3. Followup in 2 weeks for additional Procrit which may be given at 60,000 units and perhaps changing the frequency of administration to every 3 weeks. Both the patient and her daughter who accompanied her today are in agreement.   All questions were answered. The patient knows to call the clinic with any problems, questions or concerns. We can  certainly see the patient much sooner if necessary.   I spent 30 minutes counseling the patient face to face. The total time spent in the appointment was 25 minutes.    Maurilio Lovely, MD 02/18/2013 5:41 PM

## 2013-02-23 ENCOUNTER — Inpatient Hospital Stay (HOSPITAL_COMMUNITY): Payer: Medicare Other

## 2013-03-02 ENCOUNTER — Inpatient Hospital Stay (HOSPITAL_COMMUNITY): Payer: Medicare Other

## 2013-03-04 ENCOUNTER — Encounter (HOSPITAL_COMMUNITY): Payer: Medicare Other

## 2013-03-04 ENCOUNTER — Ambulatory Visit (HOSPITAL_COMMUNITY): Payer: Medicare Other

## 2013-03-04 ENCOUNTER — Other Ambulatory Visit (HOSPITAL_COMMUNITY): Payer: Medicare Other

## 2013-03-04 ENCOUNTER — Encounter (HOSPITAL_COMMUNITY): Payer: Medicare Other | Attending: Oncology

## 2013-03-04 VITALS — BP 113/61 | HR 87 | Temp 98.1°F | Resp 20 | Wt 137.0 lb

## 2013-03-04 DIAGNOSIS — I10 Essential (primary) hypertension: Secondary | ICD-10-CM

## 2013-03-04 DIAGNOSIS — E119 Type 2 diabetes mellitus without complications: Secondary | ICD-10-CM

## 2013-03-04 DIAGNOSIS — N189 Chronic kidney disease, unspecified: Secondary | ICD-10-CM

## 2013-03-04 DIAGNOSIS — C9110 Chronic lymphocytic leukemia of B-cell type not having achieved remission: Secondary | ICD-10-CM

## 2013-03-04 DIAGNOSIS — D631 Anemia in chronic kidney disease: Secondary | ICD-10-CM

## 2013-03-04 DIAGNOSIS — D638 Anemia in other chronic diseases classified elsewhere: Secondary | ICD-10-CM | POA: Insufficient documentation

## 2013-03-04 LAB — CBC
HCT: 34.2 % — ABNORMAL LOW (ref 36.0–46.0)
Hemoglobin: 10.9 g/dL — ABNORMAL LOW (ref 12.0–15.0)
Platelets: 220 10*3/uL (ref 150–400)
WBC: 24.7 10*3/uL — ABNORMAL HIGH (ref 4.0–10.5)

## 2013-03-04 MED ORDER — EPOETIN ALFA 40000 UNIT/ML IJ SOLN
40000.0000 [IU] | Freq: Once | INTRAMUSCULAR | Status: AC
  Administered 2013-03-04: 40000 [IU] via SUBCUTANEOUS
  Filled 2013-03-04: qty 1

## 2013-03-04 NOTE — Patient Instructions (Signed)
[facility] Discharge Instructions  RECOMMENDATIONS MADE BY THE CONSULTANT AND ANY TEST RESULTS WILL BE SENT TO YOUR REFERRING PHYSICIAN.  EXAM FINDINGS BY THE PHYSICIAN TODAY AND SIGNS OR SYMPTOMS TO REPORT TO CLINIC OR PRIMARY PHYSICIAN:  Return in 3 weeks for labs and then to see MD  Procrit 40,000 given today  Thank you for choosing [facility] to provide your oncology and hematology care.  To afford each patient quality time with our providers, please arrive at least 15 minutes before your scheduled appointment time.  With your help, our goal is to use those 15 minutes to complete the necessary work-up to ensure our physicians have the information they need to help with your evaluation and healthcare recommendations.    Effective January 1st, 2014, we ask that you re-schedule your appointment with our physicians should you arrive 10 or more minutes late for your appointment.  We strive to give you quality time with our providers, and arriving late affects you and other patients whose appointments are after yours.    Again, thank you for choosing [facility].  Our hope is that these requests will decrease the amount of time that you wait before being seen by our physicians.       _____________________________________________________________  Should you have questions after your visit to [facility], please contact our office at [phone] between the hours of 8:30 a.m. and 5:00 p.m.  Voicemails left after 4:30 p.m. will not be returned until the following business day.  For prescription refill requests, have your pharmacy contact our office with your prescription refill request.

## 2013-03-04 NOTE — Progress Notes (Signed)
[facility] OFFICE PROGRESS NOTE  Catalina Pizza, MD  [address] [place] [address]  DIAGNOSIS: Anemia of chronic disease - Plan: CBC  CLL (chronic lymphocytic leukemia) - Plan: CBC  Chief Complaint  Patient presents with  . Leukemia  . Anemia    CURRENT THERAPY: Procrit every 3-4 weeks depending upon hemoglobin  INTERVAL HISTORY: Anne [name] 79 y.o. female returns for followup of chronic lymphocytic leukemia and anemia of chronic disease per Continues to feel well with no new complaints. She is interested in obtaining a 4 pronged cane for ambulation with which I wholeheartedly agree. Normal bowel movements with no dysuria, hematuria, fever, night sweats, easy satiety, dark urine, severe fatigue, cough, wheezing, lower extremity swelling or redness, headache, or seizures.   MEDICAL HISTORY: Past Medical History  Diagnosis Date  . CLL (chronic lymphocytic leukemia)     stable  . Anemia   . DM (diabetes mellitus)   . Hypercholesteremia   . DJD (degenerative joint disease) of knee     bilat  . Hypertension   . Coronary artery disease     three-vessel CAD with CABG -- x5 -- left internal mammary artery to LAD, sequential saphenous vein graft to first diagonal and first obtuse marginal, sequential saphenous vein graft to posterior descending and obtuse marginal 2 (posterolateral), endoscopic vein harvest of righ leg  . Shortness of breath   . CHF (congestive heart failure)     [place] Class 3 Heart Failure  . Blood transfusion   . CLL (chronic lymphocytic leukemia)   . CLL (chronic lymphocytic leukemia) 04/03/2007    Stage 0 CLL    . Anemia of chronic disease 10/30/2011  . Ischemic cardiomyopathy     EF of 35% on 2D Echo done 04/07/12 moderate intraventricular dyssynchrony, moderate MR and moderate to severe TR with an RVSP of 31    INTERIM HISTORY: has CLL (chronic lymphocytic leukemia); HYPOTHYROIDISM; DIABETES MELLITUS, TYPE II, UNCONTROLLED;  HYPERLIPIDEMIA; DEPRESSION; CATARACTS; HYPERTENSION; ARTHRITIS; LOW BACK PAIN, CHRONIC; BURSITIS, ACROMIOCLAVICULAR, LEFT; MEMORY LOSS; FECAL INCONTINENCE; Ischemic cardiomyopathy; Chronic systolic dysfunction of left ventricle; Anemia of chronic disease; and Pacemaker-St.Jude on her problem list.    ALLERGIES:  is allergic to lisinopril.  MEDICATIONS: has a current medication list which includes the following prescription(s): acetaminophen, aspirin, carvedilol, citalopram, diovan, docusate calcium, donepezil hcl, fish oil-omega-3 fatty acids, furosemide, glimepiride, hydrocodone-acetaminophen, lidocaine, lorazepam, metformin, multiple vitamins-minerals, namenda, pravastatin, tradjenta, and tramadol.  SURGICAL HISTORY:  Past Surgical History  Procedure Laterality Date  . Coronary artery bypass graft  06/22/2009    x5 -- left internal mammary artery to LAD, sequential saphenous vein graft to first diagonal and first obtuse marginal, sequential saphenous vein graft to posterior descending and obtuse marginal 2 (posterolateral), endoscopic vein harvest of righ leg  . Goiter resection    . Thyroidectomy  1970s  . Cataract extraction, bilateral    . Rectal abscess    . Replacement total knee bilateral      bilateral  . Cardiac catheterization  01/18/2011    Est. EF of 25% to 30 - Mildly reduced cardiac output with no signs of decompensated heart failure and normal right heart filling pressures -- Patent bypass grafts with underlying three-vessel coronary artery disease  . Cardiac catheterization  06/14/2009    Est. EF of of 45% - Multivessel coronary artery disease with high-grade lesions in the LAD, left circumflex, and RCA -- Mildly reduced left ventricular function -- No significant aortic stenosis  or mitral regurgitation  . Pacemaker insertion  04/16/2011    biventricular pacemaker insertion  . Eye surgery    . Steroid injections to sacral spine 01/28/13 - dr Murray Hodgkins  01/28/13    FAMILY HISTORY:  family history includes Heart attack (age of onset: 34) in her brother; Heart disease (age of onset: 54) in her son; Heart failure (age of onset: 54) in her brother; Hypertension in her son.  SOCIAL HISTORY:  reports that she has never smoked. She has never used smokeless tobacco. She reports that she does not drink alcohol or use illicit drugs.  REVIEW OF SYSTEMS:  Other than that discussed above is noncontributory.  PHYSICAL EXAMINATION: ECOG PERFORMANCE STATUS: 1 - Symptomatic but completely ambulatory  Blood pressure 113/61, pulse 87, temperature 98.1 F (36.7 C), temperature source Oral, resp. rate 20, weight 137 lb (62.143 kg).  GENERAL:alert, no distress and comfortable SKIN: skin color, texture, turgor are normal, no rashes or significant lesions EYES: PERLA; Conjunctiva are pink and non-injected, sclera clear OROPHARYNX:no exudate, no erythema on lips, buccal mucosa, or tongue. NECK: supple, thyroid normal size, non-tender, without nodularity. No masses CHEST: Normal AP diameter with no breast masses. LYMPH:  no palpable lymphadenopathy in the cervical, axillary or inguinal LUNGS: clear to auscultation and percussion with normal breathing effort HEART: regular rate & rhythm and no murmurs and no lower extremity edema ABDOMEN:abdomen soft, non-tender and normal bowel sounds MUSCULOSKELETAL:no cyanosis of digits and no clubbing. Range of motion normal.  NEURO: alert & oriented x 3 with fluent speech, no focal motor/sensory deficits   LABORATORY DATA: Office Visit on 03/04/2013  Component Date Value Range Status  . WBC 03/04/2013 24.7* 4.0 - 10.5 K/uL Final  . RBC 03/04/2013 3.64* 3.87 - 5.11 MIL/uL Final  . Hemoglobin 03/04/2013 10.9* 12.0 - 15.0 g/dL Final  . HCT 16/02/9603 34.2* 36.0 - 46.0 % Final  . MCV 03/04/2013 94.0  78.0 - 100.0 fL Final  . MCH 03/04/2013 29.9  26.0 - 34.0 pg Final  . MCHC 03/04/2013 31.9  30.0 - 36.0 g/dL Final  . RDW 54/01/8118 16.0* 11.5 - 15.5 %  Final  . Platelets 03/04/2013 220  150 - 400 K/uL Final  Office Visit on 02/18/2013  Component Date Value Range Status  . WBC 02/18/2013 23.2* 4.0 - 10.5 K/uL Final  . RBC 02/18/2013 3.35* 3.87 - 5.11 MIL/uL Final  . Hemoglobin 02/18/2013 10.0* 12.0 - 15.0 g/dL Final  . HCT 14/78/2956 31.3* 36.0 - 46.0 % Final  . MCV 02/18/2013 93.4  78.0 - 100.0 fL Final  . MCH 02/18/2013 29.9  26.0 - 34.0 pg Final  . MCHC 02/18/2013 31.9  30.0 - 36.0 g/dL Final  . RDW 21/30/8657 15.8* 11.5 - 15.5 % Final  . Platelets 02/18/2013 190  150 - 400 K/uL Final  Infusion on 02/11/2013  Component Date Value Range Status  . Sodium 02/11/2013 131* 135 - 145 mEq/L Final  . Potassium 02/11/2013 5.0  3.5 - 5.1 mEq/L Final  . Chloride 02/11/2013 98  96 - 112 mEq/L Final  . CO2 02/11/2013 24  19 - 32 mEq/L Final  . Glucose, Bld 02/11/2013 351* 70 - 99 mg/dL Final  . BUN 84/69/6295 26* 6 - 23 mg/dL Final  . Creatinine, Ser 02/11/2013 1.17* 0.50 - 1.10 mg/dL Final  . Calcium 28/41/3244 9.3  8.4 - 10.5 mg/dL Final  . GFR calc non Af Amer 02/11/2013 42* >90 mL/min Final  . GFR calc Af Amer 02/11/2013 49* >90  mL/min Final   Comment: (NOTE)                          The eGFR has been calculated using the CKD EPI equation.                          This calculation has not been validated in all clinical situations.                          eGFR's persistently <90 mL/min signify possible Chronic Kidney                          Disease.  . WBC 02/11/2013 21.8* 4.0 - 10.5 K/uL Final  . RBC 02/11/2013 3.43* 3.87 - 5.11 MIL/uL Final  . Hemoglobin 02/11/2013 10.1* 12.0 - 15.0 g/dL Final  . HCT 16/02/9603 31.8* 36.0 - 46.0 % Final  . MCV 02/11/2013 92.7  78.0 - 100.0 fL Final  . MCH 02/11/2013 29.4  26.0 - 34.0 pg Final  . MCHC 02/11/2013 31.8  30.0 - 36.0 g/dL Final  . RDW 54/01/8118 14.9  11.5 - 15.5 % Final  . Platelets 02/11/2013 201  150 - 400 K/uL Final  . Neutrophils Relative % 02/11/2013 12* 43 - 77 % Final  .  Lymphocytes Relative 02/11/2013 86* 12 - 46 % Final  . Monocytes Relative 02/11/2013 2* 3 - 12 % Final  . Eosinophils Relative 02/11/2013 0  0 - 5 % Final  . Basophils Relative 02/11/2013 0  0 - 1 % Final  . Neutro Abs 02/11/2013 2.6  1.7 - 7.7 K/uL Final  . Lymphs Abs 02/11/2013 18.8* 0.7 - 4.0 K/uL Final  . Monocytes Absolute 02/11/2013 0.4  0.1 - 1.0 K/uL Final  . Eosinophils Absolute 02/11/2013 0.0  0.0 - 0.7 K/uL Final  . Basophils Absolute 02/11/2013 0.0  0.0 - 0.1 K/uL Final  . Uric Acid, Serum 02/11/2013 6.6  2.4 - 7.0 mg/dL Final  Infusion on 14/78/2956  Component Date Value Range Status  . Sodium 02/04/2013 136  135 - 145 mEq/L Final  . Potassium 02/04/2013 5.0  3.5 - 5.1 mEq/L Final  . Chloride 02/04/2013 99  96 - 112 mEq/L Final  . CO2 02/04/2013 26  19 - 32 mEq/L Final  . Glucose, Bld 02/04/2013 200* 70 - 99 mg/dL Final  . BUN 21/30/8657 40* 6 - 23 mg/dL Final  . Creatinine, Ser 02/04/2013 1.42* 0.50 - 1.10 mg/dL Final  . Calcium 84/69/6295 10.6* 8.4 - 10.5 mg/dL Final  . Total Protein 02/04/2013 7.1  6.0 - 8.3 g/dL Final  . Albumin 28/41/3244 3.7  3.5 - 5.2 g/dL Final  . AST 05/30/7251 30  0 - 37 U/L Final  . ALT 02/04/2013 23  0 - 35 U/L Final  . Alkaline Phosphatase 02/04/2013 91  39 - 117 U/L Final  . Total Bilirubin 02/04/2013 0.4  0.3 - 1.2 mg/dL Final  . GFR calc non Af Amer 02/04/2013 33* >90 mL/min Final  . GFR calc Af Amer 02/04/2013 39* >90 mL/min Final   Comment: (NOTE)                          The eGFR has been calculated using the CKD EPI equation.  This calculation has not been validated in all clinical situations.                          eGFR's persistently <90 mL/min signify possible Chronic Kidney                          Disease.  Marland Kitchen LDH 02/04/2013 189  94 - 250 U/L Final  . Uric Acid, Serum 02/04/2013 8.6* 2.4 - 7.0 mg/dL Final  . WBC 96/08/5407 20.3* 4.0 - 10.5 K/uL Final  . RBC 02/04/2013 3.66* 3.87 - 5.11 MIL/uL Final  .  Hemoglobin 02/04/2013 11.1* 12.0 - 15.0 g/dL Final  . HCT 81/19/1478 33.9* 36.0 - 46.0 % Final  . MCV 02/04/2013 92.6  78.0 - 100.0 fL Final  . MCH 02/04/2013 30.3  26.0 - 34.0 pg Final  . MCHC 02/04/2013 32.7  30.0 - 36.0 g/dL Final  . RDW 29/56/2130 15.5  11.5 - 15.5 % Final  . Platelets 02/04/2013 205  150 - 400 K/uL Final  . Neutrophils Relative % 02/04/2013 8* 43 - 77 % Final  . Neutro Abs 02/04/2013 1.5* 1.7 - 7.7 K/uL Final  . Lymphocytes Relative 02/04/2013 91* 12 - 46 % Final  . Lymphs Abs 02/04/2013 18.4* 0.7 - 4.0 K/uL Final  . Monocytes Relative 02/04/2013 1* 3 - 12 % Final  . Monocytes Absolute 02/04/2013 0.3  0.1 - 1.0 K/uL Final  . Eosinophils Relative 02/04/2013 1  0 - 5 % Final  . Eosinophils Absolute 02/04/2013 0.1  0.0 - 0.7 K/uL Final  . Basophils Relative 02/04/2013 0  0 - 1 % Final  . Basophils Absolute 02/04/2013 0.0  0.0 - 0.1 K/uL Final  . WBC Morphology 02/04/2013 ABSOLUTE LYMPHOCYTOSIS   Final   Comment: ATYPICAL LYMPHOCYTES                          SMUDGE CELLS    PATHOLOGY:  Urinalysis    Component Value Date/Time   COLORURINE YELLOW 06/14/2009 0838   APPEARANCEUR CLOUDY* 06/14/2009 0838   LABSPEC 1.010 06/14/2009 0838   PHURINE 6.0 06/14/2009 0838   GLUCOSEU NEGATIVE 06/14/2009 0838   HGBUR NEGATIVE 06/14/2009 0838   BILIRUBINUR NEGATIVE 06/14/2009 0838   KETONESUR NEGATIVE 06/14/2009 0838   PROTEINUR NEGATIVE 06/14/2009 0838   UROBILINOGEN 0.2 06/14/2009 0838   NITRITE POSITIVE* 06/14/2009 0838   LEUKOCYTESUR LARGE* 06/14/2009 0838    RADIOGRAPHIC STUDIES: No results found.  ASSESSMENT: #1. Chronic lymphocytic leukemia with anemia of chronic disease, for Procrit if hemoglobin is less than 11   PLAN: #1. Procrit if hemoglobin is less than 11. #2. Prescription for 4 pronged cane was written. #3. Return in 3 weeks for CBC and possible Procrit.   All questions were answered. The patient knows to call the clinic with any problems, questions or  concerns. We can certainly see the patient much sooner if necessary.   I spent 25 minutes counseling the patient face to face. The total time spent in the appointment was 20 minutes.    Maurilio Lovely, MD 03/04/2013 4:42 PM

## 2013-03-04 NOTE — Progress Notes (Signed)
Anne [name] presents today for injection per MD orders. Procrit 40,000 administered SQ in right Abdomen. Administration without incident. Patient tolerated well.

## 2013-03-11 ENCOUNTER — Ambulatory Visit (HOSPITAL_COMMUNITY): Payer: Medicare Other

## 2013-03-11 ENCOUNTER — Other Ambulatory Visit (HOSPITAL_COMMUNITY): Payer: Medicare Other

## 2013-03-12 IMAGING — CR DG CHEST 2V
2 series · 2 of 2 positions shown · non-contrast
Comparison: 08/19/2009

CLINICAL DATA: Preoperative respiratory exam for cardiac cath.
Previous CABG.

CHEST - 2 VIEW

[view not recorded (1 of 2)]
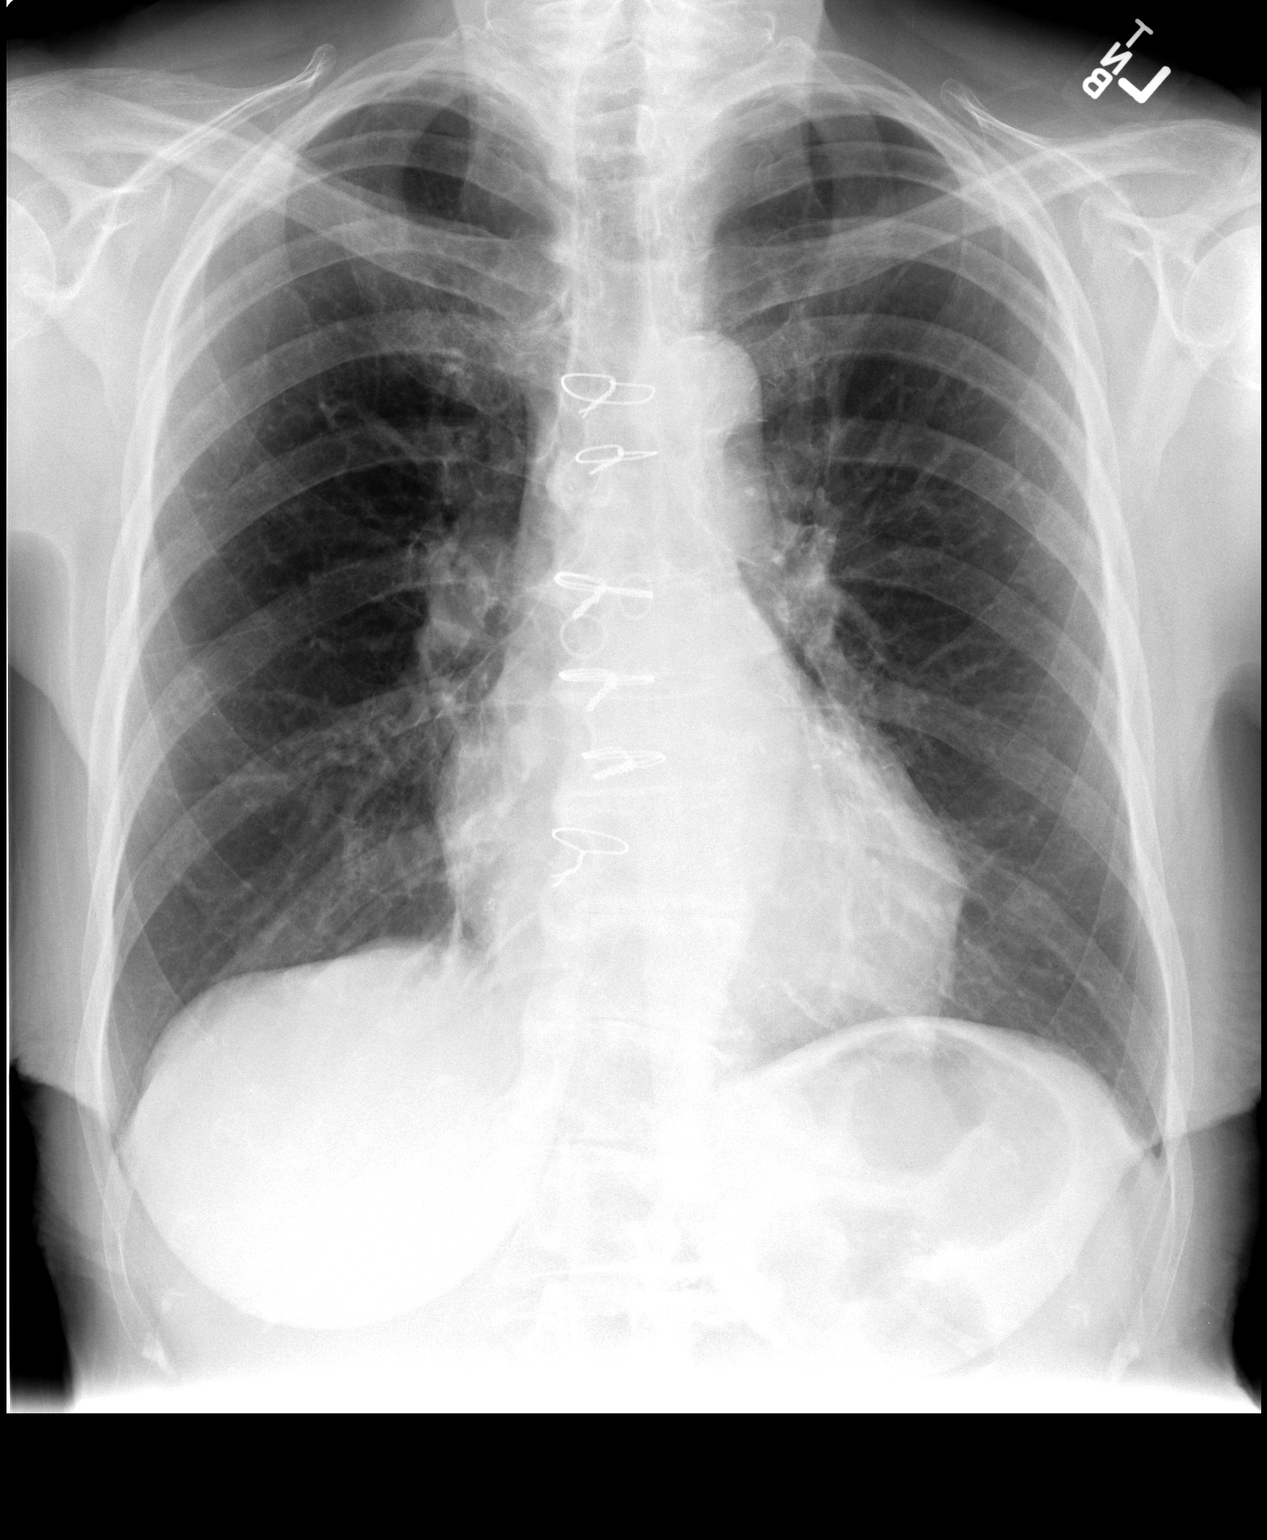

[view not recorded (2 of 2)]
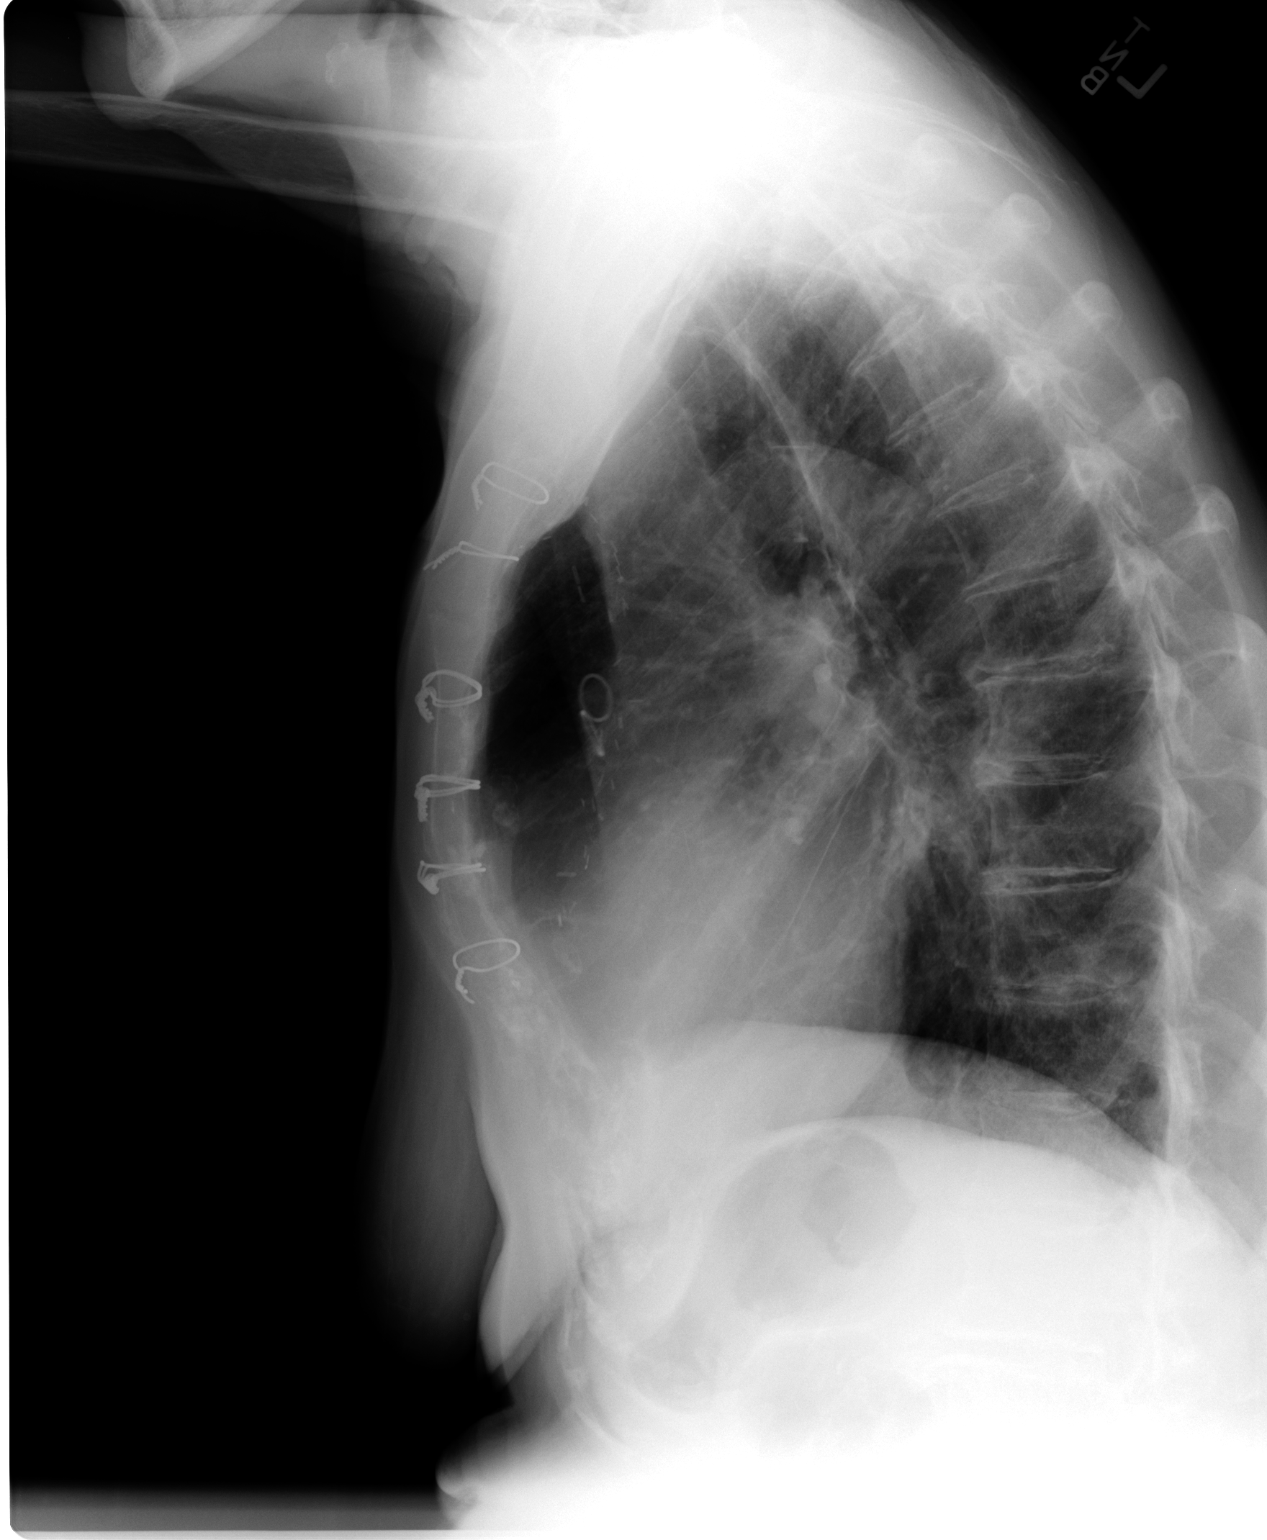

[2 of 2 positions shown; findings below may reference images not displayed]

FINDINGS: There is been median sternotomy and coronary artery
bypass grafting.  Heart size is normal.  The aorta shows mild
unfolding.  The lungs show mild chronic markings but there is no
evidence of active infiltrate, mass, effusion or collapse.  No
significant bony finding.
IMPRESSION: Previous CABG.  No active disease.

## 2013-03-25 ENCOUNTER — Encounter (HOSPITAL_BASED_OUTPATIENT_CLINIC_OR_DEPARTMENT_OTHER): Payer: Medicare Other

## 2013-03-25 ENCOUNTER — Encounter (HOSPITAL_COMMUNITY): Payer: Medicare Other

## 2013-03-25 VITALS — BP 131/76 | HR 83 | Temp 97.3°F | Resp 20 | Wt 139.9 lb

## 2013-03-25 DIAGNOSIS — C9110 Chronic lymphocytic leukemia of B-cell type not having achieved remission: Secondary | ICD-10-CM

## 2013-03-25 DIAGNOSIS — D638 Anemia in other chronic diseases classified elsewhere: Secondary | ICD-10-CM

## 2013-03-25 LAB — CBC
HCT: 37.6 % (ref 36.0–46.0)
Hemoglobin: 11.7 g/dL — ABNORMAL LOW (ref 12.0–15.0)
MCH: 29.1 pg (ref 26.0–34.0)
MCV: 93.5 fL (ref 78.0–100.0)
RBC: 4.02 MIL/uL (ref 3.87–5.11)
RDW: 15.2 % (ref 11.5–15.5)
WBC: 19.3 10*3/uL — ABNORMAL HIGH (ref 4.0–10.5)

## 2013-03-25 NOTE — Progress Notes (Signed)
Anne [name] presented for [employer]. Labs per MD order drawn via Peripheral Line 23 gauge needle inserted in lt ac.  Good blood return present. Procedure without incident.  Needle removed intact. Patient tolerated procedure well.

## 2013-03-25 NOTE — Patient Instructions (Signed)
[facility] Discharge Instructions  RECOMMENDATIONS MADE BY THE CONSULTANT AND ANY TEST RESULTS WILL BE SENT TO YOUR REFERRING PHYSICIAN.  EXAM FINDINGS BY THE PHYSICIAN TODAY AND SIGNS OR SYMPTOMS TO REPORT TO CLINIC OR PRIMARY PHYSICIAN:   Labs/Procrit/See MD on April 22, 2013 @ 2:20   Thank you for choosing [facility] to provide your oncology and hematology care.  To afford each patient quality time with our providers, please arrive at least 15 minutes before your scheduled appointment time.  With your help, our goal is to use those 15 minutes to complete the necessary work-up to ensure our physicians have the information they need to help with your evaluation and healthcare recommendations.    Effective January 1st, 2014, we ask that you re-schedule your appointment with our physicians should you arrive 10 or more minutes late for your appointment.  We strive to give you quality time with our providers, and arriving late affects you and other patients whose appointments are after yours.    Again, thank you for choosing [facility].  Our hope is that these requests will decrease the amount of time that you wait before being seen by our physicians.       _____________________________________________________________  Should you have questions after your visit to [facility], please contact our office at [phone] between the hours of 8:30 a.m. and 5:00 p.m.  Voicemails left after 4:30 p.m. will not be returned until the following business day.  For prescription refill requests, have your pharmacy contact our office with your prescription refill request.

## 2013-03-25 NOTE — Progress Notes (Signed)
[facility]  OFFICE PROGRESS NOTE  Burwell, [facility], MD  [address] [place] [address]  DIAGNOSIS: Anemia of chronic disease - Plan: CBC  CLL (chronic lymphocytic leukemia) - Plan: CBC  Chief Complaint  Patient presents with  . Leukemia    CL on no Rx except Procrit    CURRENT THERAPY: Procrit every 3-4 weeks to maintain hemoglobin close to 11.  INTERVAL HISTORY: Anne [name] 79 y.o. female returns for followup of chronic lymphocytic leukemia while receiving Procrit intermittently to maintain hemoglobin near 11.  She feels somewhat more fatigued of late because of persistent left knee pain. Analgesic medicine was changed from tramadol to hydrocodone with some improvement. She does remain active and shops for food as well as going to church every week and having her hair done once a week . Appetite is good with no nausea, vomiting, diarrhea, constipation, melena, hematochezia, hematuria, easy satiety, PND, orthopnea, palpitations, skin rash, headache, or seizures.  MEDICAL HISTORY: Past Medical History  Diagnosis Date  . CLL (chronic lymphocytic leukemia)     stable  . Anemia   . DM (diabetes mellitus)   . Hypercholesteremia   . DJD (degenerative joint disease) of knee     bilat  . Hypertension   . Coronary artery disease     three-vessel CAD with CABG -- x5 -- left internal mammary artery to LAD, sequential saphenous vein graft to first diagonal and first obtuse marginal, sequential saphenous vein graft to posterior descending and obtuse marginal 2 (posterolateral), endoscopic vein harvest of righ leg  . Shortness of breath   . CHF (congestive heart failure)     [place] Class 3 Heart Failure  . Blood transfusion   . CLL (chronic lymphocytic leukemia)   . CLL (chronic lymphocytic leukemia) 04/03/2007    Stage 0 CLL    . Anemia of chronic disease 10/30/2011  . Ischemic cardiomyopathy     EF of 35% on 2D Echo done 04/07/12 moderate  intraventricular dyssynchrony, moderate MR and moderate to severe TR with an RVSP of 31    INTERIM HISTORY: has CLL (chronic lymphocytic leukemia); HYPOTHYROIDISM; DIABETES MELLITUS, TYPE II, UNCONTROLLED; HYPERLIPIDEMIA; DEPRESSION; CATARACTS; HYPERTENSION; ARTHRITIS; LOW BACK PAIN, CHRONIC; BURSITIS, ACROMIOCLAVICULAR, LEFT; MEMORY LOSS; FECAL INCONTINENCE; Ischemic cardiomyopathy; Chronic systolic dysfunction of left ventricle; Anemia of chronic disease; and Pacemaker-St.Jude on her problem list.  known case of CLL since 2008 with an attempt to begin treatment with chlorambucil and Obinutuzumab that was aborted when repeat evaluation indicated that perhaps it would be no need to treat for the disease at this time.  ALLERGIES:  is allergic to lisinopril.  MEDICATIONS: has a current medication list which includes the following prescription(s): acetaminophen, aspirin, carvedilol, citalopram, diovan, docusate calcium, donepezil hcl, fish oil-omega-3 fatty acids, furosemide, glimepiride, hydrocodone-acetaminophen, lidocaine, lorazepam, metformin, multiple vitamins-minerals, namenda, pravastatin, tradjenta, and tramadol.  SURGICAL HISTORY:  Past Surgical History  Procedure Laterality Date  . Coronary artery bypass graft  06/22/2009    x5 -- left internal mammary artery to LAD, sequential saphenous vein graft to first diagonal and first obtuse marginal, sequential saphenous vein graft to posterior descending and obtuse marginal 2 (posterolateral), endoscopic vein harvest of righ leg  . Goiter resection    . Thyroidectomy  1970s  . Cataract extraction, bilateral    . Rectal abscess    . Replacement total knee bilateral      bilateral  . Cardiac catheterization  01/18/2011    Est. EF of 25% to 30 - Mildly reduced cardiac output with no signs of decompensated heart failure and normal right heart filling pressures -- Patent bypass grafts with underlying three-vessel coronary artery disease  . Cardiac  catheterization  06/14/2009    Est. EF of of 45% - Multivessel coronary artery disease with high-grade lesions in the LAD, left circumflex, and RCA -- Mildly reduced left ventricular function -- No significant aortic stenosis or mitral regurgitation  . Pacemaker insertion  04/16/2011    biventricular pacemaker insertion  . Eye surgery    . Steroid injections to sacral spine 01/28/13 - dr Murray Hodgkins  01/28/13    FAMILY HISTORY: family history includes Heart attack (age of onset: 22) in her brother; Heart disease (age of onset: 29) in her son; Heart failure (age of onset: 9) in her brother; Hypertension in her son.  SOCIAL HISTORY:  reports that she has never smoked. She has never used smokeless tobacco. She reports that she does not drink alcohol or use illicit drugs.  REVIEW OF SYSTEMS:  Other than that discussed above is noncontributory.  PHYSICAL EXAMINATION: ECOG PERFORMANCE STATUS: 1 - Symptomatic but completely ambulatory  Blood pressure 131/76, pulse 83, temperature 97.3 F (36.3 C), temperature source Oral, resp. rate 20, weight 139 lb 14.4 oz (63.458 kg).  GENERAL:alert, no distress and comfortable SKIN: skin color, texture, turgor are normal, no rashes or significant lesions EYES: PERLA; Conjunctiva are pink and non-injected, sclera clear OROPHARYNX:no exudate, no erythema on lips, buccal mucosa, or tongue. NECK: supple, thyroid normal size, non-tender, without nodularity. No masses CHEST: Normal AP diameter with no breast masses. Midline sternotomy scar well healed LYMPH:  no palpable lymphadenopathy in the cervical, axillary or inguinal LUNGS: clear to auscultation and percussion with normal breathing effort HEART: regular rate & rhythm and no murmurs and no lower extremity edema ABDOMEN:abdomen soft, non-tender and normal bowel sounds MUSCULOSKELETAL:no cyanosis of digits and no clubbing. Range of motion normal.  NEURO: alert & oriented x 3 with fluent speech, no focal  motor/sensory deficits   LABORATORY DATA: Office Visit on 03/25/2013  Component Date Value Range Status  . WBC 03/25/2013 19.3* 4.0 - 10.5 K/uL Final  . RBC 03/25/2013 4.02  3.87 - 5.11 MIL/uL Final  . Hemoglobin 03/25/2013 11.7* 12.0 - 15.0 g/dL Final  . HCT 16/02/9603 37.6  36.0 - 46.0 % Final  . MCV 03/25/2013 93.5  78.0 - 100.0 fL Final  . MCH 03/25/2013 29.1  26.0 - 34.0 pg Final  . MCHC 03/25/2013 31.1  30.0 - 36.0 g/dL Final  . RDW 54/01/8118 15.2  11.5 - 15.5 % Final  . Platelets 03/25/2013 195  150 - 400 K/uL Final  Office Visit on 03/04/2013  Component Date Value Range Status  . WBC 03/04/2013 24.7* 4.0 - 10.5 K/uL Final  . RBC 03/04/2013 3.64* 3.87 - 5.11 MIL/uL Final  . Hemoglobin 03/04/2013 10.9* 12.0 - 15.0 g/dL Final  . HCT 14/78/2956 34.2* 36.0 - 46.0 % Final  . MCV 03/04/2013 94.0  78.0 - 100.0 fL Final  . MCH 03/04/2013 29.9  26.0 - 34.0 pg Final  . MCHC 03/04/2013 31.9  30.0 - 36.0 g/dL Final  . RDW 21/30/8657 16.0* 11.5 - 15.5 % Final  . Platelets 03/04/2013 220  150 - 400 K/uL Final    PATHOLOGY:  Urinalysis    Component Value Date/Time   COLORURINE YELLOW 06/14/2009 0838   APPEARANCEUR CLOUDY* 06/14/2009 0838   LABSPEC 1.010 06/14/2009  0838   PHURINE 6.0 06/14/2009 0838   GLUCOSEU NEGATIVE 06/14/2009 0838   HGBUR NEGATIVE 06/14/2009 0838   BILIRUBINUR NEGATIVE 06/14/2009 0838   KETONESUR NEGATIVE 06/14/2009 0838   PROTEINUR NEGATIVE 06/14/2009 0838   UROBILINOGEN 0.2 06/14/2009 0838   NITRITE POSITIVE* 06/14/2009 0838   LEUKOCYTESUR LARGE* 06/14/2009 1610    RADIOGRAPHIC STUDIES: No results found.  ASSESSMENT:  #1. Chronic lymphocytic leukemia, stable with anemia, responsive to Procrit every 3-4 weeks.  #2. Degenerative joint disease of the left knee, status post total knee replacement, recently evaluated by orthopedics with no indication for any surgical intervention. #3. Diabetes mellitus, type II, non-insulin requiring, controlled. #4. Hypertension,  controlled. #5. Coronary artery disease, status post CABG x5 for three-vessel disease. Evidence of heart failure or dysrhythmia at this time.   PLAN:  #1. Procrit held today because her hemoglobin is 11.7. #2 . Followup in one month. Patient was encouraged to use hydrocodone to relieve discomfort so she can remain active. She was told to call to come in earlier CT becomes progressively worse or should she develop dark urine, fever, or evidence of severe bruising.   All questions were answered. The patient knows to call the clinic with any problems, questions or concerns. We can certainly see the patient much sooner if necessary.   I spent 25 minutes counseling the patient face to face. The total time spent in the appointment was 30 minutes.    Maurilio Lovely, MD 03/25/2013 3:19 PM

## 2013-04-08 ENCOUNTER — Encounter: Payer: Self-pay | Admitting: Internal Medicine

## 2013-04-08 ENCOUNTER — Ambulatory Visit (INDEPENDENT_AMBULATORY_CARE_PROVIDER_SITE_OTHER): Payer: Medicare Other | Admitting: Internal Medicine

## 2013-04-08 ENCOUNTER — Encounter: Payer: Medicare Other | Admitting: Internal Medicine

## 2013-04-08 VITALS — BP 136/71 | HR 72 | Ht 63.0 in | Wt 140.0 lb

## 2013-04-08 DIAGNOSIS — I255 Ischemic cardiomyopathy: Secondary | ICD-10-CM

## 2013-04-08 DIAGNOSIS — I10 Essential (primary) hypertension: Secondary | ICD-10-CM

## 2013-04-08 DIAGNOSIS — I2589 Other forms of chronic ischemic heart disease: Secondary | ICD-10-CM

## 2013-04-08 DIAGNOSIS — I519 Heart disease, unspecified: Secondary | ICD-10-CM

## 2013-04-08 LAB — MDC_IDC_ENUM_SESS_TYPE_INCLINIC
Battery Remaining Longevity: 82.8 mo
Date Time Interrogation Session: 20141112121338
Implantable Pulse Generator Model: 3210
Implantable Pulse Generator Serial Number: 2707573
Lead Channel Impedance Value: 437.5 Ohm
Lead Channel Impedance Value: 912.5 Ohm
Lead Channel Pacing Threshold Amplitude: 1 V
Lead Channel Pacing Threshold Amplitude: 1 V
Lead Channel Pacing Threshold Pulse Width: 0.4 ms
Lead Channel Pacing Threshold Pulse Width: 0.4 ms
Lead Channel Sensing Intrinsic Amplitude: 1.5 mV
Lead Channel Sensing Intrinsic Amplitude: 12 mV
Lead Channel Setting Pacing Amplitude: 1.875
Lead Channel Setting Pacing Amplitude: 2 V
Lead Channel Setting Pacing Pulse Width: 0.4 ms
Lead Channel Setting Pacing Pulse Width: 0.4 ms

## 2013-04-08 NOTE — Patient Instructions (Signed)
Continue all current medications. Your physician wants you to follow up in:  1 year.  You will receive a reminder letter in the mail one-two months in advance.  If you don't receive a letter, please call our office to schedule the follow up appointment - [place]  

## 2013-04-08 NOTE — Progress Notes (Signed)
PCP:  Catalina Pizza, MD Primary Cardiologist:  Dr Rennis Golden  The patient presents today for routine electrophysiology followup.  She is doing very well.  Her primary concern is with back and knee pain related to OA.  Today, she denies symptoms of palpitations, chest pain, shortness of breath, orthopnea, PND, lower extremity edema, dizziness, presyncope, syncope, or neurologic sequela.  The patient feels that she is tolerating medications without difficulties and is otherwise without complaint today.   Past Medical History  Diagnosis Date  . CLL (chronic lymphocytic leukemia)     stable  . Anemia   . DM (diabetes mellitus)   . Hypercholesteremia   . DJD (degenerative joint disease) of knee     bilat  . Hypertension   . Coronary artery disease     three-vessel CAD with CABG -- x5 -- left internal mammary artery to LAD, sequential saphenous vein graft to first diagonal and first obtuse marginal, sequential saphenous vein graft to posterior descending and obtuse marginal 2 (posterolateral), endoscopic vein harvest of righ leg  . Shortness of breath   . CHF (congestive heart failure)     [place] Class 3 Heart Failure  . Blood transfusion   . CLL (chronic lymphocytic leukemia)   . CLL (chronic lymphocytic leukemia) 04/03/2007    Stage 0 CLL    . Anemia of chronic disease 10/30/2011  . Ischemic cardiomyopathy     EF of 35% on 2D Echo done 04/07/12 moderate intraventricular dyssynchrony, moderate MR and moderate to severe TR with an RVSP of 31   Past Surgical History  Procedure Laterality Date  . Coronary artery bypass graft  06/22/2009    x5 -- left internal mammary artery to LAD, sequential saphenous vein graft to first diagonal and first obtuse marginal, sequential saphenous vein graft to posterior descending and obtuse marginal 2 (posterolateral), endoscopic vein harvest of righ leg  . Goiter resection    . Thyroidectomy  1970s  . Cataract extraction, bilateral    . Rectal abscess    . Replacement  total knee bilateral      bilateral  . Cardiac catheterization  01/18/2011    Est. EF of 25% to 30 - Mildly reduced cardiac output with no signs of decompensated heart failure and normal right heart filling pressures -- Patent bypass grafts with underlying three-vessel coronary artery disease  . Cardiac catheterization  06/14/2009    Est. EF of of 45% - Multivessel coronary artery disease with high-grade lesions in the LAD, left circumflex, and RCA -- Mildly reduced left ventricular function -- No significant aortic stenosis or mitral regurgitation  . Pacemaker insertion  04/16/2011    biventricular pacemaker insertion  . Eye surgery    . Steroid injections to sacral spine 01/28/13 - dr Murray Hodgkins  01/28/13    Current Outpatient Prescriptions  Medication Sig Dispense Refill  . acetaminophen (TYLENOL) 325 MG tablet Take 650 mg by mouth every 6 (six) hours as needed. For pain       . aspirin 81 MG EC tablet Take 81 mg by mouth daily.       . carvedilol (COREG) 6.25 MG tablet Take 9.375 mg by mouth 2 (two) times daily with a meal.       . citalopram (CELEXA) 10 MG tablet Take 10 mg by mouth 2 (two) times daily.       Marland Kitchen DIOVAN 160 MG tablet Take 160 mg by mouth daily. & 1/2 tab on M, W, F nights      .  Docusate Calcium (STOOL SOFTENER PO) Take 2 capsules by mouth as needed.       . Donepezil HCl (ARICEPT PO) Take 5 mg by mouth 3 (three) times a week.       . fish oil-omega-3 fatty acids 1000 MG capsule Take 1 g by mouth daily.      . furosemide (LASIX) 20 MG tablet Take 20 mg by mouth daily.        Marland Kitchen glimepiride (AMARYL) 4 MG tablet Take 4 mg by mouth daily before breakfast.       . HYDROcodone-acetaminophen (NORCO) 7.5-325 MG per tablet Take 1 tablet by mouth every 6 (six) hours as needed for pain.      . IRON CR PO Take 1 tablet by mouth 3 (three) times a week.      Marland Kitchen LC-4 LIDOCAINE EX Apply topically 3 (three) times daily as needed. To lt knee      . LORazepam (ATIVAN) 0.5 MG tablet Take 0.5 mg by  mouth every 8 (eight) hours as needed. FOR NERVES      . metFORMIN (GLUCOPHAGE) 1000 MG tablet Take 1,000 mg by mouth 2 (two) times daily with a meal.       . Multiple Vitamins-Minerals (CENTRUM SILVER PO) Take 1 tablet by mouth daily.       Marland Kitchen NAMENDA 10 MG tablet Take 10 mg by mouth 2 (two) times daily.       . pravastatin (PRAVACHOL) 40 MG tablet Take 1 tablet (40 mg total) by mouth daily.  90 tablet  2  . TRADJENTA 5 MG TABS tablet Take 5 mg by mouth at bedtime.       . traMADol (ULTRAM) 50 MG tablet Take 50 mg by mouth at bedtime.        No current facility-administered medications for this visit.    Allergies  Allergen Reactions  . Lisinopril     REACTION: Dry Cough    History   Social History  . Marital Status: Married    Spouse Name: N/A    Number of Children: 3  . Years of Education: N/A   Occupational History  .     Social History Main Topics  . Smoking status: Never Smoker   . Smokeless tobacco: Never Used  . Alcohol Use: No  . Drug Use: No  . Sexual Activity: No   Other Topics Concern  . Not on file   Social History Narrative   [place] in [place].  Engineer, petroleum prior to retiring.    Physical Exam: Filed Vitals:   04/08/13 0841  BP: 136/71  Pulse: 72  Height: 5\' 3"  (1.6 m)  Weight: 140 lb (63.504 kg)    GEN- The patient is elderly appearing, alert and oriented x 3 today.   Head- normocephalic, atraumatic Eyes-  Sclera clear, conjunctiva pink Ears- hearing intact Oropharynx- clear Neck- supple Lungs- Clear to ausculation bilaterally, normal work of breathing Chest- pacemaker pocket is well healed Heart- Regular rate and rhythm  GI- soft, NT, ND, + BS Extremities- no clubbing, cyanosis, or edema  Pacemaker interrogation- reviewed in detail today,  See PACEART report  Assessment and Plan:  1. Chronic systolic dysfunction Doing well s/p CRT-P implant Normal pacemaker function See Pace Art report No changes today  2.  HTN Stable No change required today  3. Ischemic CM No ischemic symptoms   Merlin Return in 1 year

## 2013-04-22 ENCOUNTER — Encounter (HOSPITAL_BASED_OUTPATIENT_CLINIC_OR_DEPARTMENT_OTHER): Payer: Medicare Other

## 2013-04-22 ENCOUNTER — Encounter (HOSPITAL_COMMUNITY): Payer: Medicare Other | Attending: Oncology

## 2013-04-22 ENCOUNTER — Encounter (HOSPITAL_COMMUNITY): Payer: Medicare Other

## 2013-04-22 VITALS — BP 138/70 | HR 71 | Temp 97.5°F | Resp 18

## 2013-04-22 DIAGNOSIS — D638 Anemia in other chronic diseases classified elsewhere: Secondary | ICD-10-CM

## 2013-04-22 DIAGNOSIS — D631 Anemia in chronic kidney disease: Secondary | ICD-10-CM

## 2013-04-22 DIAGNOSIS — C9110 Chronic lymphocytic leukemia of B-cell type not having achieved remission: Secondary | ICD-10-CM | POA: Insufficient documentation

## 2013-04-22 DIAGNOSIS — N189 Chronic kidney disease, unspecified: Secondary | ICD-10-CM

## 2013-04-22 LAB — CBC
HCT: 33.3 % — ABNORMAL LOW (ref 36.0–46.0)
Hemoglobin: 10.7 g/dL — ABNORMAL LOW (ref 12.0–15.0)
MCHC: 32.1 g/dL (ref 30.0–36.0)
MCV: 91.2 fL (ref 78.0–100.0)

## 2013-04-22 MED ORDER — EPOETIN ALFA 40000 UNIT/ML IJ SOLN
INTRAMUSCULAR | Status: AC
  Filled 2013-04-22: qty 1

## 2013-04-22 MED ORDER — EPOETIN ALFA 40000 UNIT/ML IJ SOLN
40000.0000 [IU] | Freq: Once | INTRAMUSCULAR | Status: AC
  Administered 2013-04-22: 40000 [IU] via SUBCUTANEOUS

## 2013-04-22 NOTE — Patient Instructions (Signed)
.  [facility] Discharge Instructions  RECOMMENDATIONS MADE BY THE CONSULTANT AND ANY TEST RESULTS WILL BE SENT TO YOUR REFERRING PHYSICIAN.  EXAM FINDINGS BY THE PHYSICIAN TODAY AND SIGNS OR SYMPTOMS TO REPORT TO CLINIC OR PRIMARY PHYSICIAN: Exam and findings as discussed by Dr. Zigmund Daniel.  MEDICATIONS PRESCRIBED:  Use your pain medicine as scheduled so you keep a steady level of medicine in your system epogen 40000 units today repeat in 6 weeks INSTRUCTIONS/FOLLOW-UP: 6 weeks  Thank you for choosing [facility] to provide your oncology and hematology care.  To afford each patient quality time with our providers, please arrive at least 15 minutes before your scheduled appointment time.  With your help, our goal is to use those 15 minutes to complete the necessary work-up to ensure our physicians have the information they need to help with your evaluation and healthcare recommendations.    Effective January 1st, 2014, we ask that you re-schedule your appointment with our physicians should you arrive 10 or more minutes late for your appointment.  We strive to give you quality time with our providers, and arriving late affects you and other patients whose appointments are after yours.    Again, thank you for choosing [facility].  Our hope is that these requests will decrease the amount of time that you wait before being seen by our physicians.       _____________________________________________________________  Should you have questions after your visit to [facility], please contact our office at [phone] between the hours of 8:30 a.m. and 5:00 p.m.  Voicemails left after 4:30 p.m. will not be returned until the following business day.  For prescription refill requests, have your pharmacy contact our office with your prescription refill request.

## 2013-04-22 NOTE — Progress Notes (Signed)
Anne [name] presented for [employer]. Labs per MD order drawn via Peripheral Line 25 gauge needle inserted in rt ac.  Good blood return present. Procedure without incident.  Needle removed intact. Patient tolerated procedure well. [name] presents today for injection per MD orders. Procrit 40981 units administered SQ in left Abdomen. Administration without incident. Patient tolerated well.

## 2013-04-22 NOTE — Progress Notes (Signed)
[facility]  OFFICE PROGRESS NOTE  [place], [facility], [place]  [address] [place] [address]  DIAGNOSIS: Anemia of chronic disease - Plan: CBC, PROCRIT TREATMENT CONDITION, epoetin alfa (EPOGEN,PROCRIT) injection 40,000 Units, PROCRIT TREATMENT CONDITION  CLL (chronic lymphocytic leukemia) - Plan: CBC, PROCRIT TREATMENT CONDITION, epoetin alfa (EPOGEN,PROCRIT) injection 40,000 Units, PROCRIT TREATMENT CONDITION  Chief Complaint  Patient presents with  . Leukemia  . Anemia    CKD    CURRENT THERAPY: intermittent Procrit to maintain hemoglobin close to 11.  INTERVAL HISTORY: Anne [name] 79 y.o. female returns for followup of chronic lymphocytic leukemia with concurrent anemia secondary to chronic kidney disease.   According to her and her daughter she said the best month she's had a long time over the last month. Her previous nerve block however is wearing off but she is taking hydrocodone twice a day by the clock and tramadol at that time. She is looking forward to the next opportunity to have nerve block performed since she had excellent control of pain for about a month. Appetite is excellent with no nausea, vomiting, diarrhea, constipation, lower extremity swelling or redness, PND, orthopnea, or palpitations. Recent pacemaker check was excellent. She denies any skin rash, headache, or incontinence. Left knee gives way when she ambulates representing her greatest morbidity in combination with back pain. MEDICAL HISTORY: Past Medical History  Diagnosis Date  . CLL (chronic lymphocytic leukemia)     stable  . Anemia   . DM (diabetes mellitus)   . Hypercholesteremia   . DJD (degenerative joint disease) of knee     bilat  . Hypertension   . Coronary artery disease     three-vessel CAD with CABG -- x5 -- left internal mammary artery to LAD, sequential saphenous vein graft to first diagonal and first obtuse marginal, sequential saphenous vein  graft to posterior descending and obtuse marginal 2 (posterolateral), endoscopic vein harvest of righ leg  . Shortness of breath   . CHF (congestive heart failure)     [place] Class 3 Heart [place]  . Blood transfusion   . CLL (chronic lymphocytic leukemia)   . CLL (chronic lymphocytic leukemia) 04/03/2007    Stage 0 CLL    . Anemia of chronic disease 10/30/2011  . Ischemic cardiomyopathy     EF of 35% on 2D Echo done 04/07/12 moderate intraventricular dyssynchrony, moderate MR and moderate to severe TR with an RVSP of 31    INTERIM HISTORY: has CLL (chronic lymphocytic leukemia); HYPOTHYROIDISM; DIABETES MELLITUS, TYPE II, UNCONTROLLED; HYPERLIPIDEMIA; DEPRESSION; CATARACTS; HYPERTENSION; ARTHRITIS; LOW BACK PAIN, CHRONIC; BURSITIS, ACROMIOCLAVICULAR, LEFT; MEMORY LOSS; FECAL INCONTINENCE; Ischemic cardiomyopathy; Chronic systolic dysfunction of left ventricle; Anemia of chronic disease; and Pacemaker-[employer]ude on her problem list.   Known case of CLL since 2008 with an attempt to begin treatment with chlorambucil and Obinutuzumab that was aborted when repeat evaluation indicated that perhaps there would be no need to treat for the disease at this time.  ALLERGIES:  is allergic to lisinopril.  MEDICATIONS: has a current medication list which includes the following prescription(s): acetaminophen, aspirin, carvedilol, citalopram, diovan, docusate calcium, donepezil hcl, fish oil-omega-3 fatty acids, furosemide, glimepiride, hydrocodone-acetaminophen, iron, lidocaine, lorazepam, metformin, multiple vitamins-minerals, namenda, pravastatin, tradjenta, and tramadol.  SURGICAL HISTORY:  Past Surgical History  Procedure Laterality Date  . Coronary artery bypass graft  06/22/2009    x5 -- left internal mammary artery to LAD, sequential saphenous vein graft to  first diagonal and first obtuse marginal, sequential saphenous vein graft to posterior descending and obtuse marginal 2 (posterolateral), endoscopic vein  harvest of righ leg  . Goiter resection    . Thyroidectomy  1970s  . Cataract extraction, bilateral    . Rectal abscess    . Replacement total knee bilateral      bilateral  . Cardiac catheterization  01/18/2011    Est. EF of 25% to 30 - Mildly reduced cardiac output with no signs of decompensated heart failure and normal right heart filling pressures -- Patent bypass grafts with underlying three-vessel coronary artery disease  . Cardiac catheterization  06/14/2009    Est. EF of of 45% - Multivessel coronary artery disease with high-grade lesions in the LAD, left circumflex, and RCA -- Mildly reduced left ventricular function -- No significant aortic stenosis or mitral regurgitation  . Pacemaker insertion  04/16/2011    biventricular pacemaker insertion  . Eye surgery    . Steroid injections to sacral spine 01/28/13 - dr Murray Hodgkins  01/28/13    FAMILY HISTORY: family history includes Heart attack (age of onset: 32) in her brother; Heart disease (age of onset: 33) in her son; Heart failure (age of onset: 42) in her brother; Hypertension in her son.  SOCIAL HISTORY:  reports that she has never smoked. She has never used smokeless tobacco. She reports that she does not drink alcohol or use illicit drugs.  REVIEW OF SYSTEMS:  Other than that discussed above is noncontributory.  PHYSICAL EXAMINATION: ECOG PERFORMANCE STATUS: 1 - Symptomatic but completely ambulatory  Blood pressure 138/70, pulse 71, temperature 97.5 F (36.4 C), temperature source Oral, resp. rate 18.  GENERAL:alert, no distress and comfortable SKIN: skin color, texture, turgor are normal, no rashes or significant lesions EYES: PERLA; Conjunctiva are pink and non-injected, sclera clear OROPHARYNX:no exudate, no erythema on lips, buccal mucosa, or tongue. NECK: supple, thyroid normal size, non-tender, without nodularity. No masses CHEST: midline certainly scar with no breast masses. Pacemaker in place. LYMPH:  no palpable  lymphadenopathy in the cervical, axillary or inguinal LUNGS: clear to auscultation and percussion with normal breathing effort HEART: regular rate & rhythm and no murmurs. ABDOMEN:abdomen soft, non-tender and normal bowel sounds MUSCULOSKELETAL:no cyanosis of digits and no clubbing. Decreased range of motion of the left knee with slight swelling but no effusion.  NEURO: alert & oriented x 3 with fluent speech, no focal motor/sensory deficits   LABORATORY DATA: Office Visit on 04/22/2013  Component Date Value Range Status  . WBC 04/22/2013 20.2* 4.0 - 10.5 K/uL Final  . RBC 04/22/2013 3.65* 3.87 - 5.11 MIL/uL Final  . Hemoglobin 04/22/2013 10.7* 12.0 - 15.0 g/dL Final  . HCT 40/98/1191 33.3* 36.0 - 46.0 % Final  . MCV 04/22/2013 91.2  78.0 - 100.0 fL Final  . MCH 04/22/2013 29.3  26.0 - 34.0 pg Final  . MCHC 04/22/2013 32.1  30.0 - 36.0 g/dL Final  . RDW 47/82/9562 15.5  11.5 - 15.5 % Final  . Platelets 04/22/2013 163  150 - 400 K/uL Final  Office Visit on 04/08/2013  Component Date Value Range Status  . Date Time Interrogation Session 04/08/2013 [phone]   Final  . Pulse Generator Manufacturer 04/08/2013 [employer]   Final  . Pulse Gen Model 04/08/2013 3210 [employer] RF   Final  . Pulse Gen Serial Number 04/08/2013 [id number]   Final  . RV Sense Sensitivity 04/08/2013 2.0   Final  . RA Pace Amplitude 04/08/2013 1.875  Final  . RV Pace PulseWidth 04/08/2013 0.4   Final  . RV Pace Amplitude 04/08/2013 2.0   Final  . LV Pace PulseWidth 04/08/2013 0.4   Final  . LV Pace Amplitude 04/08/2013 2.0   Final  . RA Impedance 04/08/2013 437.5   Final  . RA Amplitude 04/08/2013 1.5   Final  . RA Pacing Amplitude 04/08/2013 1.00   Final  . RA Pacing PulseWidth 04/08/2013 0.4   Final  . RV IMPEDANCE 04/08/2013 562.5   Final  . RV Amplitude 04/08/2013 12.0   Final  . RV Pacing Amplitude 04/08/2013 1.0   Final  . RV Pacing PulseWidth 04/08/2013 0.4   Final  . LV Impedance 04/08/2013  912.5   Final  . LV Pacing Amplitude 04/08/2013 1.0   Final  . LV PACING PULSEWIDTH 04/08/2013 0.4   Final  . Battery Status 04/08/2013 Unknown   Final  . Battery Longevity 04/08/2013 82.8   Final  . Battery Voltage 04/08/2013 2.95   Final  . Huston Foley RA Perc Paced 04/08/2013 1.2   Final  . Huston Foley RV Perc Paced 04/08/2013 99.54   Final  . Eval Rhythm 04/08/2013 SR @ 72   Final  . Miscellaneous Comment 04/08/2013    Final                   Value:Pacemaker check in clinic. Normal device function. Thresholds, sensing, impedances consistent with previous measurements. Device programmed to maximize longevity. 4 mode switches--all 4 seconds long. No high ventricular rates noted. Device programmed at                          appropriate safety margins. Histogram distribution appropriate for patient activity level. Device programmed to optimize intrinsic conduction. Estimated longevity 6.8 years. Patient enrolled in remote follow-up. Merlin 07-09-13 and ROV in 12 mths w/JA in                          [place].  Office Visit on 03/25/2013  Component Date Value Range Status  . WBC 03/25/2013 19.3* 4.0 - 10.5 K/uL Final  . RBC 03/25/2013 4.02  3.87 - 5.11 MIL/uL Final  . Hemoglobin 03/25/2013 11.7* 12.0 - 15.0 g/dL Final  . HCT 16/02/9603 37.6  36.0 - 46.0 % Final  . MCV 03/25/2013 93.5  78.0 - 100.0 fL Final  . MCH 03/25/2013 29.1  26.0 - 34.0 pg Final  . MCHC 03/25/2013 31.1  30.0 - 36.0 g/dL Final  . RDW 54/01/8118 15.2  11.5 - 15.5 % Final  . Platelets 03/25/2013 195  150 - 400 K/uL Final    PATHOLOGY:none new  Urinalysis    Component Value Date/Time   COLORURINE YELLOW 06/14/2009 0838   APPEARANCEUR CLOUDY* 06/14/2009 0838   LABSPEC 1.010 06/14/2009 0838   PHURINE 6.0 06/14/2009 0838   GLUCOSEU NEGATIVE 06/14/2009 0838   HGBUR NEGATIVE 06/14/2009 0838   BILIRUBINUR NEGATIVE 06/14/2009 0838   KETONESUR NEGATIVE 06/14/2009 0838   PROTEINUR NEGATIVE 06/14/2009 0838   UROBILINOGEN 0.2 06/14/2009 0838    NITRITE POSITIVE* 06/14/2009 0838   LEUKOCYTESUR LARGE* 06/14/2009 0838    RADIOGRAPHIC STUDIES: No results found.  ASSESSMENT:  #1. Chronic lymphocytic leukemia with anemia, stable #2. Degenerative joint disease left knee, status post total knee replacement, symptomatic. #3. Diabetes mellitus, type II, non-insulin requiring, controlled. #4. Hypertension, controlled. #5. Coronary artery disease, status post CABG x5 for three-vessel disease, diastolic dysfunction  controlled well with dual chamber pacemaker.   PLAN:  #1.Procrit 40,000 units subcutaneously.  #2. Followup with CBC and additional Procrit treatment in 6 weeks. #3. In the presence of her daughter, she was advised to call if any symptoms worsen prior to your appointment so that appropriate intervention could be taken.   All questions were answered. The patient knows to call the clinic with any problems, questions or concerns. We can certainly see the patient much sooner if necessary.   I spent 25 minutes counseling the patient face to face. The total time spent in the appointment was 30 minutes.    Maurilio Lovely, MD 04/22/2013 3:23 PM

## 2013-06-02 NOTE — Progress Notes (Signed)
Anne Cahill, MD  [place] [address]  CLL (chronic lymphocytic leukemia) - Plan: CBC with Differential, Comprehensive metabolic panel, Lactate dehydrogenase, Sedimentation rate  Anemia of chronic disease - Plan: CBC with Differential, Comprehensive metabolic panel  CURRENT THERAPY: Surveillance per NCCN guidelines and intermittent Procrit to maintain hemoglobin close to 11   INTERVAL HISTORY: Anne [name] 80 y.o. female returns for  regular  visit for followup of lymphocytic leukemia with concurrent anemia secondary to chronic kidney disease.   I personally reviewed and went over laboratory results with the patient.  Results follow below.   NCCN guidelines for indication for treatment of CLL are:  A. Eligible for clinical trial  B. Significant disease-related symptoms   1. Fatigue (severe)   2. Night sweats   3. Weight loss   4. Fever without infection  C. Threatened end-organ function  D. Progressive bulky disease (spleen >6cm below costal margin, lymph nodes >10 cm)  E. Progressive anemia  F. Progressive thrombocytopenia.  She denies any complaints including B symptoms.  She does not meet treatment parameters listed above.   She otherwise denies any complaints.  I suspect her husband has passed due to some of the comments she has made during our conversation.  He was demented and she was the full time caregiver for him.    She is accompanied by her daughter who works in the [employer] at [place], she denies any complaints and ROS questioning is negative.     Past Medical History  Diagnosis Date  . CLL (chronic lymphocytic leukemia)     stable  . Anemia   . DM (diabetes mellitus)   . Hypercholesteremia   . DJD (degenerative joint disease) of knee     bilat  . Hypertension   . Coronary artery disease     three-vessel CAD with CABG -- x5 -- left internal mammary artery to LAD, sequential saphenous vein graft to first  diagonal and first obtuse marginal, sequential saphenous vein graft to posterior descending and obtuse marginal 2 (posterolateral), endoscopic vein harvest of righ leg  . Shortness of breath   . CHF (congestive heart failure)     [place] Class 3 Heart Failure  . Blood transfusion   . CLL (chronic lymphocytic leukemia)   . CLL (chronic lymphocytic leukemia) 04/03/2007    Stage 0 CLL    . Anemia of chronic disease 10/30/2011  . Ischemic cardiomyopathy     EF of 35% on 2D Echo done 04/07/12 moderate intraventricular dyssynchrony, moderate MR and moderate to severe TR with an RVSP of 31    has CLL (chronic lymphocytic leukemia); HYPOTHYROIDISM; DIABETES MELLITUS, TYPE II, UNCONTROLLED; HYPERLIPIDEMIA; DEPRESSION; CATARACTS; HYPERTENSION; ARTHRITIS; LOW BACK PAIN, CHRONIC; BURSITIS, ACROMIOCLAVICULAR, LEFT; MEMORY LOSS; FECAL INCONTINENCE; Ischemic cardiomyopathy; Chronic systolic dysfunction of left ventricle; Anemia of chronic disease; and Pacemaker-St.Jude on her problem list.     is allergic to lisinopril.  Ms. Sproule does not currently have medications on file.  Past Surgical History  Procedure Laterality Date  . Coronary artery bypass graft  06/22/2009    x5 -- left internal mammary artery to LAD, sequential saphenous vein graft to first diagonal and first obtuse marginal, sequential saphenous vein graft to posterior descending and obtuse marginal 2 (posterolateral), endoscopic vein harvest of righ leg  . Goiter resection    . Thyroidectomy  1970s  . Cataract extraction, bilateral    . Rectal abscess    .  Replacement total knee bilateral      bilateral  . Cardiac catheterization  01/18/2011    Est. EF of 25% to 30 - Mildly reduced cardiac output with no signs of decompensated heart failure and normal right heart filling pressures -- Patent bypass grafts with underlying three-vessel coronary artery disease  . Cardiac catheterization  06/14/2009    Est. EF of of 45% - Multivessel coronary artery  disease with high-grade lesions in the LAD, left circumflex, and RCA -- Mildly reduced left ventricular function -- No significant aortic stenosis or mitral regurgitation  . Pacemaker insertion  04/16/2011    biventricular pacemaker insertion  . Eye surgery    . Steroid injections to sacral spine 01/28/13 - dr Brien Few  01/28/13    Denies any headaches, dizziness, double vision, fevers, chills, night sweats, nausea, vomiting, diarrhea, constipation, chest pain, heart palpitations, shortness of breath, blood in stool, black tarry stool, urinary pain, urinary burning, urinary frequency, hematuria.   PHYSICAL EXAMINATION  ECOG PERFORMANCE STATUS: 2 - Symptomatic, <50% confined to bed  There were no vitals filed for this visit.  GENERAL:alert, healthy, no distress, well nourished, well developed, comfortable, cooperative and smiling SKIN: skin color, texture, turgor are normal, no rashes or significant lesions HEAD: Normocephalic, No masses, lesions, tenderness or abnormalities EYES: normal, PERRLA, EOMI, Conjunctiva are pink and non-injected EARS: External ears normal OROPHARYNX:mucous membranes are moist  NECK: supple, no adenopathy, thyroid normal size, non-tender, without nodularity, no stridor, non-tender, trachea midline LYMPH:  no palpable lymphadenopathy, no hepatosplenomegaly BREAST:not examined LUNGS: clear to auscultation  HEART: regular rate & rhythm, no murmurs and no gallops ABDOMEN:abdomen soft and normal bowel sounds BACK: Back symmetric, no curvature. EXTREMITIES:less then 2 second capillary refill, no skin discoloration, no cyanosis  NEURO: alert & oriented x 3 with fluent speech, no focal motor/sensory deficits, in a wheelchair    LABORATORY DATA: CBC    Component Value Date/Time   WBC 20.2* 04/22/2013 1440   RBC 3.65* 04/22/2013 1440   RBC 3.45* 07/13/2008 1350   HGB 10.7* 04/22/2013 1440   HCT 33.3* 04/22/2013 1440   PLT 163 04/22/2013 1440   MCV 91.2 04/22/2013  1440   MCH 29.3 04/22/2013 1440   MCHC 32.1 04/22/2013 1440   RDW 15.5 04/22/2013 1440   LYMPHSABS 18.8* 02/11/2013 1055   MONOABS 0.4 02/11/2013 1055   EOSABS 0.0 02/11/2013 1055   BASOSABS 0.0 02/11/2013 1055      Chemistry      Component Value Date/Time   NA 131* 02/11/2013 1055   K 5.0 02/11/2013 1055   CL 98 02/11/2013 1055   CO2 24 02/11/2013 1055   BUN 26* 02/11/2013 1055   CREATININE 1.17* 02/11/2013 1055   CREATININE 1.07 04/10/2011 1027      Component Value Date/Time   CALCIUM 9.3 02/11/2013 1055   ALKPHOS 91 02/04/2013 1019   AST 30 02/04/2013 1019   ALT 23 02/04/2013 1019   BILITOT 0.4 02/04/2013 1019        ASSESSMENT:  1. Chronic lymphocytic leukemia, no need for intervention at this time.  2. Anemia secondary to chronic kidney disease.  3. Chronic renal disease, Grade 3B 4. Degenerative joint disease left knee, status post total knee replacement, symptomatic. 5. Diabetes mellitus, type II, non-insulin requiring, controlled. 6. Hypertension, controlled. 7. Coronary artery disease, status post CABG x5 for three-vessel disease, diastolic dysfunction controlled well with dual chamber pacemaker  Patient Active Problem List   Diagnosis Date Noted  . Pacemaker-St.Jude 03/25/2012  .  Anemia of chronic disease 10/30/2011  . Ischemic cardiomyopathy 04/05/2011  . Chronic systolic dysfunction of left ventricle 04/05/2011  . BURSITIS, ACROMIOCLAVICULAR, LEFT 01/17/2009  . MEMORY LOSS 06/15/2008  . FECAL INCONTINENCE 06/15/2008  . CLL (chronic lymphocytic leukemia) 04/03/2007  . HYPOTHYROIDISM 04/02/2007  . DIABETES MELLITUS, TYPE II, UNCONTROLLED 04/02/2007  . HYPERLIPIDEMIA 04/02/2007  . DEPRESSION 04/02/2007  . CATARACTS 04/02/2007  . HYPERTENSION 04/02/2007  . ARTHRITIS 04/02/2007  . LOW BACK PAIN, CHRONIC 04/02/2007     PLAN:  1. I personally reviewed and went over laboratory results with the patient. 2. Labs today: CBC diff, CMET, LDH, ESR 3. Labs every 4 weeks:  CBC 4. Review of NCCN guidelines for indications for initiation of CLL treatment 5. Administer Procrit 40,000 units when indicated by lab work. 6. Return in 3 months for follow-up   THERAPY PLAN:  From an anemia of chronic disease point of view, we will continue with Procrit every 4 weeks or as indicated per lab results.  We will follow NCCN guidelines for initiation of treatment for CLL.  NCCN guidelines for indication for treatment of CLL are:  A. Eligible for clinical trial  B. Significant disease-related symptoms   1. Fatigue (severe)   2. Night sweats   3. Weight loss   4. Fever without infection  C. Threatened end-organ function  D. Progressive bulky disease (spleen >6cm below costal margin, lymph nodes >10 cm)  E. Progressive anemia  F. Progressive thrombocytopenia.   All questions were answered. The patient knows to call the clinic with any problems, questions or concerns. We can certainly see the patient much sooner if necessary.  Patient and plan discussed with Dr. Farrel Gobble and he is in agreement with the aforementioned.   KEFALAS,THOMAS

## 2013-06-03 ENCOUNTER — Encounter (HOSPITAL_COMMUNITY): Payer: Medicare Other | Attending: Oncology

## 2013-06-03 ENCOUNTER — Encounter (HOSPITAL_BASED_OUTPATIENT_CLINIC_OR_DEPARTMENT_OTHER): Payer: Medicare Other | Admitting: Oncology

## 2013-06-03 ENCOUNTER — Encounter (HOSPITAL_BASED_OUTPATIENT_CLINIC_OR_DEPARTMENT_OTHER): Payer: Medicare Other

## 2013-06-03 VITALS — BP 132/54 | HR 79 | Temp 98.1°F | Resp 18 | Wt 139.8 lb

## 2013-06-03 DIAGNOSIS — D638 Anemia in other chronic diseases classified elsewhere: Secondary | ICD-10-CM | POA: Insufficient documentation

## 2013-06-03 DIAGNOSIS — D631 Anemia in chronic kidney disease: Secondary | ICD-10-CM

## 2013-06-03 DIAGNOSIS — C9110 Chronic lymphocytic leukemia of B-cell type not having achieved remission: Secondary | ICD-10-CM | POA: Insufficient documentation

## 2013-06-03 DIAGNOSIS — N183 Chronic kidney disease, stage 3 unspecified: Secondary | ICD-10-CM

## 2013-06-03 DIAGNOSIS — N039 Chronic nephritic syndrome with unspecified morphologic changes: Secondary | ICD-10-CM

## 2013-06-03 LAB — COMPREHENSIVE METABOLIC PANEL
ALT: 11 U/L (ref 0–35)
AST: 19 U/L (ref 0–37)
Albumin: 3.8 g/dL (ref 3.5–5.2)
Alkaline Phosphatase: 71 U/L (ref 39–117)
BUN: 31 mg/dL — ABNORMAL HIGH (ref 6–23)
CALCIUM: 9.5 mg/dL (ref 8.4–10.5)
CO2: 26 mEq/L (ref 19–32)
CREATININE: 1.35 mg/dL — AB (ref 0.50–1.10)
Chloride: 102 mEq/L (ref 96–112)
GFR calc Af Amer: 41 mL/min — ABNORMAL LOW (ref >90)
GFR, EST NON AFRICAN AMERICAN: 35 mL/min — AB (ref >90)
Glucose, Bld: 162 mg/dL — ABNORMAL HIGH (ref 70–99)
Potassium: 4.2 mEq/L (ref 3.7–5.3)
Sodium: 141 mEq/L (ref 137–147)
Total Bilirubin: 0.3 mg/dL (ref 0.3–1.2)
Total Protein: 7.4 g/dL (ref 6.0–8.3)

## 2013-06-03 LAB — SEDIMENTATION RATE: Sed Rate: 45 mm/hr — ABNORMAL HIGH (ref 0–22)

## 2013-06-03 LAB — CBC WITH DIFFERENTIAL/PLATELET
BASOS ABS: 0 10*3/uL (ref 0.0–0.1)
Basophils Relative: 0 % (ref 0–1)
EOS ABS: 0.2 10*3/uL (ref 0.0–0.7)
Eosinophils Relative: 1 % (ref 0–5)
HCT: 32.6 % — ABNORMAL LOW (ref 36.0–46.0)
Hemoglobin: 10.4 g/dL — ABNORMAL LOW (ref 12.0–15.0)
LYMPHS ABS: 13.3 10*3/uL — AB (ref 0.7–4.0)
Lymphocytes Relative: 77 % — ABNORMAL HIGH (ref 12–46)
MCH: 28.7 pg (ref 26.0–34.0)
MCHC: 31.9 g/dL (ref 30.0–36.0)
MCV: 89.8 fL (ref 78.0–100.0)
Monocytes Absolute: 0.3 10*3/uL (ref 0.1–1.0)
Monocytes Relative: 2 % — ABNORMAL LOW (ref 3–12)
Neutro Abs: 3.4 10*3/uL (ref 1.7–7.7)
Neutrophils Relative %: 20 % — ABNORMAL LOW (ref 43–77)
Platelets: 182 10*3/uL (ref 150–400)
RBC: 3.63 MIL/uL — ABNORMAL LOW (ref 3.87–5.11)
RDW: 16.7 % — AB (ref 11.5–15.5)
WBC: 17.2 10*3/uL — ABNORMAL HIGH (ref 4.0–10.5)

## 2013-06-03 LAB — LACTATE DEHYDROGENASE: LDH: 198 U/L (ref 94–250)

## 2013-06-03 MED ORDER — EPOETIN ALFA 40000 UNIT/ML IJ SOLN
40000.0000 [IU] | Freq: Once | INTRAMUSCULAR | Status: AC
  Administered 2013-06-03: 40000 [IU] via SUBCUTANEOUS

## 2013-06-03 MED ORDER — EPOETIN ALFA 40000 UNIT/ML IJ SOLN
INTRAMUSCULAR | Status: AC
  Filled 2013-06-03: qty 1

## 2013-06-03 NOTE — Progress Notes (Signed)
[facility] Discharge Instructions  RECOMMENDATIONS MADE BY THE CONSULTANT AND ANY TEST RESULTS WILL BE SENT TO YOUR REFERRING PHYSICIAN.  EXAM FINDINGS BY THE PHYSICIAN TODAY AND SIGNS OR SYMPTOMS TO REPORT TO CLINIC OR PRIMARY PHYSICIAN: Anne [name] today    INSTRUCTIONS GIVEN AND DISCUSSED: Lab work and Procrit monthly and follow up in 3 months with the doctor    Thank you for choosing [facility] to provide your oncology and hematology care.  To afford each patient quality time with our providers, please arrive at least 15 minutes before your scheduled appointment time.  With your help, our goal is to use those 15 minutes to complete the necessary work-up to ensure our physicians have the information they need to help with your evaluation and healthcare recommendations.    Effective January 1st, 2014, we ask that you re-schedule your appointment with our physicians should you arrive 10 or more minutes late for your appointment.  We strive to give you quality time with our providers, and arriving late affects you and other patients whose appointments are after yours.    Again, thank you for choosing [facility].  Our hope is that these requests will decrease the amount of time that you wait before being seen by our physicians.       _____________________________________________________________  Should you have questions after your visit to [facility], please contact our office at [phone] between the hours of 8:30 a.m. and 5:00 p.m.  Voicemails left after 4:30 p.m. will not be returned until the following business day.  For prescription refill requests, have your pharmacy contact our office with your prescription refill request.

## 2013-06-03 NOTE — Progress Notes (Signed)
Anne [name] presents today for injection per MD orders. Procrit 40,000 units administered SQ in left lower abdomen. Administration without incident. Patient tolerated well.

## 2013-06-03 NOTE — Progress Notes (Signed)
Anne [name] presented for [employer]. Labs per MD order drawn via Peripheral Line 23 gauge needle inserted in rt Procedure without incident.  Needle removed intact. Patient tolerated procedure well.

## 2013-07-01 ENCOUNTER — Ambulatory Visit (HOSPITAL_COMMUNITY): Payer: Medicare Other

## 2013-07-01 ENCOUNTER — Encounter (HOSPITAL_COMMUNITY): Payer: Medicare Other | Attending: Oncology

## 2013-07-01 ENCOUNTER — Other Ambulatory Visit (HOSPITAL_COMMUNITY): Payer: Medicare Other

## 2013-07-01 ENCOUNTER — Encounter (HOSPITAL_BASED_OUTPATIENT_CLINIC_OR_DEPARTMENT_OTHER): Payer: Medicare Other

## 2013-07-01 VITALS — BP 114/67 | HR 87 | Temp 98.6°F | Resp 18

## 2013-07-01 DIAGNOSIS — C9110 Chronic lymphocytic leukemia of B-cell type not having achieved remission: Secondary | ICD-10-CM | POA: Insufficient documentation

## 2013-07-01 DIAGNOSIS — R059 Cough, unspecified: Secondary | ICD-10-CM | POA: Insufficient documentation

## 2013-07-01 DIAGNOSIS — D638 Anemia in other chronic diseases classified elsewhere: Secondary | ICD-10-CM

## 2013-07-01 DIAGNOSIS — N183 Chronic kidney disease, stage 3 unspecified: Secondary | ICD-10-CM

## 2013-07-01 DIAGNOSIS — N039 Chronic nephritic syndrome with unspecified morphologic changes: Secondary | ICD-10-CM

## 2013-07-01 DIAGNOSIS — D631 Anemia in chronic kidney disease: Secondary | ICD-10-CM

## 2013-07-01 DIAGNOSIS — R05 Cough: Secondary | ICD-10-CM | POA: Insufficient documentation

## 2013-07-01 LAB — CBC
HCT: 30.5 % — ABNORMAL LOW (ref 36.0–46.0)
Hemoglobin: 9.8 g/dL — ABNORMAL LOW (ref 12.0–15.0)
MCH: 28.9 pg (ref 26.0–34.0)
MCHC: 32.1 g/dL (ref 30.0–36.0)
MCV: 90 fL (ref 78.0–100.0)
PLATELETS: 275 10*3/uL (ref 150–400)
RBC: 3.39 MIL/uL — ABNORMAL LOW (ref 3.87–5.11)
RDW: 16.1 % — ABNORMAL HIGH (ref 11.5–15.5)
WBC: 15.6 10*3/uL — AB (ref 4.0–10.5)

## 2013-07-01 MED ORDER — EPOETIN ALFA 40000 UNIT/ML IJ SOLN
40000.0000 [IU] | Freq: Once | INTRAMUSCULAR | Status: AC
  Administered 2013-07-01: 40000 [IU] via SUBCUTANEOUS

## 2013-07-01 MED ORDER — BENZONATATE 200 MG PO CAPS
200.0000 mg | ORAL_CAPSULE | Freq: Three times a day (TID) | ORAL | Status: AC | PRN
Start: 2013-07-01 — End: 2013-07-29

## 2013-07-01 MED ORDER — EPOETIN ALFA 40000 UNIT/ML IJ SOLN
INTRAMUSCULAR | Status: AC
  Filled 2013-07-01: qty 1

## 2013-07-01 NOTE — Progress Notes (Signed)
Labs drawn today for cbc 

## 2013-07-01 NOTE — Progress Notes (Signed)
Anne [name]'s reason for visit today is for an injection and labs as scheduled per MD orders.  Labs were prior to injection.  Anne [name] also received procrit 40,000 units per MD orders; see [facility] for administration details.  Anne [name] tolerated all procedures well and without incident; questions were answered and patient was discharged. Assessed lung fields, WNL, asked T. Keflas.  Prescription given for cough.

## 2013-07-09 ENCOUNTER — Encounter: Payer: Medicare Other | Admitting: *Deleted

## 2013-07-15 ENCOUNTER — Encounter: Payer: Self-pay | Admitting: *Deleted

## 2013-07-20 ENCOUNTER — Telehealth: Payer: Self-pay | Admitting: Internal Medicine

## 2013-07-20 NOTE — Telephone Encounter (Signed)
Spoke with Beverly(daughter) and reviewed procedure for remote transmission.  She will have Ms Mackel transmit later this pm.

## 2013-07-20 NOTE — Telephone Encounter (Signed)
New Message  Pt daughter called. Received letter. Transmission did not go through. She states she only has to sit beside the device and it sends the signal. Not sure why it didn't go through. Please call back to discuss.

## 2013-07-29 ENCOUNTER — Encounter (HOSPITAL_COMMUNITY): Payer: Medicare Other | Attending: Oncology

## 2013-07-29 ENCOUNTER — Ambulatory Visit (HOSPITAL_COMMUNITY): Payer: Medicare Other

## 2013-07-29 ENCOUNTER — Encounter (HOSPITAL_BASED_OUTPATIENT_CLINIC_OR_DEPARTMENT_OTHER): Payer: Medicare Other

## 2013-07-29 VITALS — BP 124/63 | HR 77 | Resp 18

## 2013-07-29 DIAGNOSIS — D638 Anemia in other chronic diseases classified elsewhere: Secondary | ICD-10-CM | POA: Insufficient documentation

## 2013-07-29 DIAGNOSIS — N189 Chronic kidney disease, unspecified: Secondary | ICD-10-CM

## 2013-07-29 DIAGNOSIS — C9110 Chronic lymphocytic leukemia of B-cell type not having achieved remission: Secondary | ICD-10-CM

## 2013-07-29 DIAGNOSIS — D631 Anemia in chronic kidney disease: Secondary | ICD-10-CM

## 2013-07-29 DIAGNOSIS — N039 Chronic nephritic syndrome with unspecified morphologic changes: Secondary | ICD-10-CM

## 2013-07-29 LAB — CBC
HCT: 33.3 % — ABNORMAL LOW (ref 36.0–46.0)
HEMOGLOBIN: 10.6 g/dL — AB (ref 12.0–15.0)
MCH: 29.2 pg (ref 26.0–34.0)
MCHC: 31.8 g/dL (ref 30.0–36.0)
MCV: 91.7 fL (ref 78.0–100.0)
PLATELETS: 190 10*3/uL (ref 150–400)
RBC: 3.63 MIL/uL — ABNORMAL LOW (ref 3.87–5.11)
RDW: 15.9 % — AB (ref 11.5–15.5)
WBC: 20.7 10*3/uL — ABNORMAL HIGH (ref 4.0–10.5)

## 2013-07-29 MED ORDER — EPOETIN ALFA 40000 UNIT/ML IJ SOLN
INTRAMUSCULAR | Status: AC
Start: 2013-07-29 — End: 2013-07-29
  Filled 2013-07-29: qty 1

## 2013-07-29 MED ORDER — EPOETIN ALFA 40000 UNIT/ML IJ SOLN
40000.0000 [IU] | Freq: Once | INTRAMUSCULAR | Status: AC
  Administered 2013-07-29: 40000 [IU] via SUBCUTANEOUS

## 2013-07-29 NOTE — Progress Notes (Signed)
Anne [name]'s reason for visit today is for an injection and labs as scheduled per MD orders.  Labs were drawn prior to administration of ordered medication.   [place] also received epogin 40,000 units per MD orders; see [facility] for administration details.  Anne [name] tolerated all procedures well and without incident; questions were answered and patient was discharged.

## 2013-07-29 NOTE — Progress Notes (Signed)
Labs drawn today for cbc 

## 2013-08-16 ENCOUNTER — Other Ambulatory Visit: Payer: Self-pay | Admitting: Internal Medicine

## 2013-08-17 NOTE — Telephone Encounter (Signed)
Tradjenta denied and deferred to patient's PCP, Dr [name].  Rx for Carvedilol sent to pharmacy electronically.

## 2013-09-01 ENCOUNTER — Other Ambulatory Visit (HOSPITAL_COMMUNITY): Payer: Medicare Other

## 2013-09-01 ENCOUNTER — Ambulatory Visit (HOSPITAL_COMMUNITY): Payer: Medicare Other

## 2013-09-01 ENCOUNTER — Ambulatory Visit (HOSPITAL_COMMUNITY): Payer: Medicare Other | Admitting: Oncology

## 2013-09-01 NOTE — Progress Notes (Signed)
Anne Cahill, MD  [place] [address]  CLL (chronic lymphocytic leukemia) - Plan: CBC with Differential, Comprehensive metabolic panel, Lactate dehydrogenase, Sedimentation rate, PROCRIT TREATMENT CONDITION, epoetin alfa (EPOGEN,PROCRIT) injection 40,000 Units, PROCRIT TREATMENT CONDITION  Anemia of chronic disease - Plan: PROCRIT TREATMENT CONDITION, epoetin alfa (EPOGEN,PROCRIT) injection 40,000 Units, PROCRIT TREATMENT CONDITION  CURRENT THERAPY:Surveillance per NCCN guidelines and intermittent Procrit to maintain hemoglobin close to 11  INTERVAL HISTORY: Anne [name] 80 y.o. female returns for  regular  visit for followup of lymphocytic leukemia with concurrent anemia secondary to chronic kidney disease.  I personally reviewed and went over laboratory results with the patient.  The results are noted within this dictation.  Her Hgb is low and therefore she qualifies for ESA therapy today.  Her platelet count is interestingly low and we will need to monitor this.  NCCN guidelines for indication for treatment of CLL are:  A. Eligible for clinical trial  B. Significant disease-related symptoms   1. Fatigue (severe)   2. Night sweats   3. Weight loss   4. Fever without infection  C. Threatened end-organ function  D. Progressive bulky disease (spleen >6cm below costal margin, lymph nodes >10 cm)  E. Progressive anemia  F. Progressive thrombocytopenia.   She denies any B symptoms.  She had a wonderful Easter surrounded by all her loved ones.    Hematologically, she denies any complaints and ROS questioning is negative.   Past Medical History  Diagnosis Date  . CLL (chronic lymphocytic leukemia)     stable  . Anemia   . DM (diabetes mellitus)   . Hypercholesteremia   . DJD (degenerative joint disease) of knee     bilat  . Hypertension   . Coronary artery disease     three-vessel CAD with CABG -- x5 -- left internal mammary artery to LAD, sequential  saphenous vein graft to first diagonal and first obtuse marginal, sequential saphenous vein graft to posterior descending and obtuse marginal 2 (posterolateral), endoscopic vein harvest of righ leg  . Shortness of breath   . CHF (congestive heart failure)     [place] Class 3 Heart Failure  . Blood transfusion   . CLL (chronic lymphocytic leukemia)   . CLL (chronic lymphocytic leukemia) 04/03/2007    Stage 0 CLL    . Anemia of chronic disease 10/30/2011  . Ischemic cardiomyopathy     EF of 35% on 2D Echo done 04/07/12 moderate intraventricular dyssynchrony, moderate MR and moderate to severe TR with an RVSP of 31    has CLL (chronic lymphocytic leukemia); HYPOTHYROIDISM; DIABETES MELLITUS, TYPE II, UNCONTROLLED; HYPERLIPIDEMIA; DEPRESSION; CATARACTS; HYPERTENSION; ARTHRITIS; LOW BACK PAIN, CHRONIC; BURSITIS, ACROMIOCLAVICULAR, LEFT; MEMORY LOSS; FECAL INCONTINENCE; Ischemic cardiomyopathy; Chronic systolic dysfunction of left ventricle; Anemia of chronic disease; and Pacemaker-St.Jude on her problem list.     is allergic to lisinopril.  Ms. Kallenbach had no medications administered during this visit.  Past Surgical History  Procedure Laterality Date  . Coronary artery bypass graft  06/22/2009    x5 -- left internal mammary artery to LAD, sequential saphenous vein graft to first diagonal and first obtuse marginal, sequential saphenous vein graft to posterior descending and obtuse marginal 2 (posterolateral), endoscopic vein harvest of righ leg  . Goiter resection    . Thyroidectomy  1970s  . Cataract extraction, bilateral    . Rectal abscess    . Replacement total knee bilateral      bilateral  .  Cardiac catheterization  01/18/2011    Est. EF of 25% to 30 - Mildly reduced cardiac output with no signs of decompensated heart failure and normal right heart filling pressures -- Patent bypass grafts with underlying three-vessel coronary artery disease  . Cardiac catheterization  06/14/2009    Est. EF of  of 45% - Multivessel coronary artery disease with high-grade lesions in the LAD, left circumflex, and RCA -- Mildly reduced left ventricular function -- No significant aortic stenosis or mitral regurgitation  . Pacemaker insertion  04/16/2011    biventricular pacemaker insertion  . Eye surgery    . Steroid injections to sacral spine 01/28/13 - dr Brien Few  01/28/13    Denies any headaches, dizziness, double vision, fevers, chills, night sweats, nausea, vomiting, diarrhea, constipation, chest pain, heart palpitations, shortness of breath, blood in stool, black tarry stool, urinary pain, urinary burning, urinary frequency, hematuria.   PHYSICAL EXAMINATION  ECOG PERFORMANCE STATUS: 0 - Asymptomatic  Filed Vitals:   09/02/13 1100  BP: 123/64  Pulse: 68  Temp: 97.8 F (36.6 C)  Resp: 18    GENERAL:alert, no distress, well nourished, well developed, comfortable, cooperative and smiling SKIN: skin color, texture, turgor are normal, no rashes or significant lesions HEAD: Normocephalic, No masses, lesions, tenderness or abnormalities EYES: normal, PERRLA, EOMI, Conjunctiva are pink and non-injected EARS: External ears normal OROPHARYNX:mucous membranes are moist  NECK: supple, no adenopathy, thyroid normal size, non-tender, without nodularity, no stridor, non-tender, trachea midline LYMPH:  no palpable lymphadenopathy BREAST:not examined LUNGS: clear to auscultation  HEART: regular rate & rhythm, no murmurs and no gallops ABDOMEN:abdomen soft and normal bowel sounds BACK: Back symmetric, no curvature. EXTREMITIES:less then 2 second capillary refill, no joint deformities, effusion, or inflammation, no edema, no skin discoloration, no clubbing, no cyanosis  NEURO: alert & oriented x 3 with fluent speech, no focal motor/sensory deficits, gait normal   LABORATORY DATA: CBC    Component Value Date/Time   WBC 14.9* 09/02/2013 1103   RBC 3.27* 09/02/2013 1103   RBC 3.45* 07/13/2008 1350   HGB  9.6* 09/02/2013 1103   HCT 29.9* 09/02/2013 1103   PLT 143* 09/02/2013 1103   MCV 91.4 09/02/2013 1103   MCH 29.4 09/02/2013 1103   MCHC 32.1 09/02/2013 1103   RDW 15.6* 09/02/2013 1103   LYMPHSABS PENDING 09/02/2013 1103   MONOABS PENDING 09/02/2013 1103   EOSABS PENDING 09/02/2013 1103   BASOSABS PENDING 09/02/2013 1103      Chemistry      Component Value Date/Time   NA 138 09/02/2013 1103   K 4.7 09/02/2013 1103   CL 103 09/02/2013 1103   CO2 24 09/02/2013 1103   BUN 25* 09/02/2013 1103   CREATININE 1.08 09/02/2013 1103   CREATININE 1.07 04/10/2011 1027      Component Value Date/Time   CALCIUM 8.9 09/02/2013 1103   ALKPHOS 61 09/02/2013 1103   AST 21 09/02/2013 1103   ALT 12 09/02/2013 1103   BILITOT 0.2* 09/02/2013 1103        ASSESSMENT:  1. Chronic lymphocytic leukemia, no need for intervention at this time.  2. Anemia secondary to chronic kidney disease +/- CLL.  3. Chronic renal disease, Grade 3B  4. Degenerative joint disease left knee, status post total knee replacement, symptomatic.  5. Diabetes mellitus, type II, non-insulin requiring, controlled.  6. Hypertension, controlled.  7. Coronary artery disease, status post CABG x5 for three-vessel disease, diastolic dysfunction controlled well with dual chamber pacemaker  Patient Active Problem  List   Diagnosis Date Noted  . Pacemaker-St.Jude 03/25/2012  . Anemia of chronic disease 10/30/2011  . Ischemic cardiomyopathy 04/05/2011  . Chronic systolic dysfunction of left ventricle 04/05/2011  . BURSITIS, ACROMIOCLAVICULAR, LEFT 01/17/2009  . MEMORY LOSS 06/15/2008  . FECAL INCONTINENCE 06/15/2008  . CLL (chronic lymphocytic leukemia) 04/03/2007  . HYPOTHYROIDISM 04/02/2007  . DIABETES MELLITUS, TYPE II, UNCONTROLLED 04/02/2007  . HYPERLIPIDEMIA 04/02/2007  . DEPRESSION 04/02/2007  . CATARACTS 04/02/2007  . HYPERTENSION 04/02/2007  . ARTHRITIS 04/02/2007  . LOW BACK PAIN, CHRONIC 04/02/2007      PLAN:  1. I personally reviewed and went over  laboratory results with the patient.  The results are noted within this dictation. 2. Review of supportive therapy plan- Procrit 40,000 units every 4 weeks as indicated to maintain a Hgb of 11 g/dL or less. 3. Labs today: CBC diff, CMET, LDH, ESR 4. Labs every 4 weeks: CBC 5. Labs in 3 months: CBC diff, CMET, LDH, ESR 6. Review of NCCN guidelines for indications for initiation of CLL treatment 7. Procrit 40,000 units today and then monthly if treatment parameters are met. 8. Return in 3 months for follow-up   THERAPY PLAN:  From an anemia of chronic disease point of view, we will continue with Procrit every 4 weeks or as indicated per lab results. We will follow NCCN guidelines for initiation of treatment for CLL.  There are newer agents available to [facility] for CLL including Imbruvica or Zydelig (+Rituxan) if she were to require chemotherapeutic intervention.  Her platelets are noted to have dropped minimally and we will need to follow this as it may be an indication of treatment.  NCCN guidelines for indication for treatment of CLL are:  A. Eligible for clinical trial  B. Significant disease-related symptoms   1. Fatigue (severe)   2. Night sweats   3. Weight loss   4. Fever without infection  C. Threatened end-organ function  D. Progressive bulky disease (spleen >6cm below costal margin, lymph nodes >10 cm)  E. Progressive anemia  F. Progressive thrombocytopenia.   All questions were answered. The patient knows to call the clinic with any problems, questions or concerns. We can certainly see the patient much sooner if necessary.  Patient and plan discussed with Dr. Farrel Gobble and he is in agreement with the aforementioned.   [facility] 09/02/2013

## 2013-09-02 ENCOUNTER — Encounter (HOSPITAL_BASED_OUTPATIENT_CLINIC_OR_DEPARTMENT_OTHER): Payer: Medicare Other

## 2013-09-02 ENCOUNTER — Encounter (HOSPITAL_COMMUNITY): Payer: Self-pay | Admitting: Oncology

## 2013-09-02 ENCOUNTER — Encounter (HOSPITAL_COMMUNITY): Payer: Medicare Other | Attending: Oncology | Admitting: Oncology

## 2013-09-02 VITALS — BP 123/64 | HR 68 | Temp 97.8°F | Resp 18 | Wt 138.3 lb

## 2013-09-02 DIAGNOSIS — C9110 Chronic lymphocytic leukemia of B-cell type not having achieved remission: Secondary | ICD-10-CM

## 2013-09-02 DIAGNOSIS — N039 Chronic nephritic syndrome with unspecified morphologic changes: Secondary | ICD-10-CM

## 2013-09-02 DIAGNOSIS — N189 Chronic kidney disease, unspecified: Secondary | ICD-10-CM

## 2013-09-02 DIAGNOSIS — I10 Essential (primary) hypertension: Secondary | ICD-10-CM

## 2013-09-02 DIAGNOSIS — D638 Anemia in other chronic diseases classified elsewhere: Secondary | ICD-10-CM | POA: Insufficient documentation

## 2013-09-02 DIAGNOSIS — D631 Anemia in chronic kidney disease: Secondary | ICD-10-CM

## 2013-09-02 DIAGNOSIS — E119 Type 2 diabetes mellitus without complications: Secondary | ICD-10-CM

## 2013-09-02 LAB — CBC WITH DIFFERENTIAL/PLATELET
BASOS PCT: 0 % (ref 0–1)
Basophils Absolute: 0 10*3/uL (ref 0.0–0.1)
EOS PCT: 1 % (ref 0–5)
Eosinophils Absolute: 0.1 10*3/uL (ref 0.0–0.7)
HCT: 29.9 % — ABNORMAL LOW (ref 36.0–46.0)
HEMOGLOBIN: 9.6 g/dL — AB (ref 12.0–15.0)
Lymphocytes Relative: 81 % — ABNORMAL HIGH (ref 12–46)
Lymphs Abs: 12.1 10*3/uL — ABNORMAL HIGH (ref 0.7–4.0)
MCH: 29.4 pg (ref 26.0–34.0)
MCHC: 32.1 g/dL (ref 30.0–36.0)
MCV: 91.4 fL (ref 78.0–100.0)
MONO ABS: 0.3 10*3/uL (ref 0.1–1.0)
Monocytes Relative: 2 % — ABNORMAL LOW (ref 3–12)
NEUTROS PCT: 16 % — AB (ref 43–77)
Neutro Abs: 2.4 10*3/uL (ref 1.7–7.7)
Platelets: 143 10*3/uL — ABNORMAL LOW (ref 150–400)
RBC: 3.27 MIL/uL — AB (ref 3.87–5.11)
RDW: 15.6 % — ABNORMAL HIGH (ref 11.5–15.5)
WBC: 14.9 10*3/uL — ABNORMAL HIGH (ref 4.0–10.5)

## 2013-09-02 LAB — COMPREHENSIVE METABOLIC PANEL
ALK PHOS: 61 U/L (ref 39–117)
ALT: 12 U/L (ref 0–35)
AST: 21 U/L (ref 0–37)
Albumin: 3.3 g/dL — ABNORMAL LOW (ref 3.5–5.2)
BUN: 25 mg/dL — AB (ref 6–23)
CALCIUM: 8.9 mg/dL (ref 8.4–10.5)
CO2: 24 meq/L (ref 19–32)
Chloride: 103 mEq/L (ref 96–112)
Creatinine, Ser: 1.08 mg/dL (ref 0.50–1.10)
GFR, EST AFRICAN AMERICAN: 53 mL/min — AB (ref >90)
GFR, EST NON AFRICAN AMERICAN: 46 mL/min — AB (ref >90)
Glucose, Bld: 316 mg/dL — ABNORMAL HIGH (ref 70–99)
POTASSIUM: 4.7 meq/L (ref 3.7–5.3)
SODIUM: 138 meq/L (ref 137–147)
TOTAL PROTEIN: 6.6 g/dL (ref 6.0–8.3)
Total Bilirubin: 0.2 mg/dL — ABNORMAL LOW (ref 0.3–1.2)

## 2013-09-02 LAB — LACTATE DEHYDROGENASE: LDH: 176 U/L (ref 94–250)

## 2013-09-02 LAB — SEDIMENTATION RATE: Sed Rate: 35 mm/hr — ABNORMAL HIGH (ref 0–22)

## 2013-09-02 MED ORDER — EPOETIN ALFA 40000 UNIT/ML IJ SOLN
INTRAMUSCULAR | Status: AC
  Filled 2013-09-02: qty 1

## 2013-09-02 MED ORDER — EPOETIN ALFA 40000 UNIT/ML IJ SOLN
40000.0000 [IU] | Freq: Once | INTRAMUSCULAR | Status: AC
  Administered 2013-09-02: 40000 [IU] via SUBCUTANEOUS

## 2013-09-02 NOTE — Patient Instructions (Signed)
[facility] [place] Discharge Instructions  RECOMMENDATIONS MADE BY THE CONSULTANT AND ANY TEST RESULTS WILL BE SENT TO YOUR REFERRING PHYSICIAN.  EXAM FINDINGS BY THE PHYSICIAN TODAY AND SIGNS OR SYMPTOMS TO REPORT TO CLINIC OR PRIMARY PHYSICIAN: Exam and findings as discussed by Meriel Flavors.  MEDICATIONS PRESCRIBED:  Continue as prescribed.  INSTRUCTIONS/FOLLOW-UP: Continue lab work every 4 weeks +/- Procrit. MD appointment again in 3 months. Report any issues/concerns as needed prior to appointments.  Thank you for choosing [facility] to provide your oncology and hematology care.  To afford each patient quality time with our providers, please arrive at least 15 minutes before your scheduled appointment time.  With your help, our goal is to use those 15 minutes to complete the necessary work-up to ensure our physicians have the information they need to help with your evaluation and healthcare recommendations.    Effective January 1st, 2014, we ask that you re-schedule your appointment with our physicians should you arrive 10 or more minutes late for your appointment.  We strive to give you quality time with our providers, and arriving late affects you and other patients whose appointments are after yours.    Again, thank you for choosing [facility].  Our hope is that these requests will decrease the amount of time that you wait before being seen by our physicians.       _____________________________________________________________  Should you have questions after your visit to [facility], please contact our office at [phone] between the hours of 8:30 a.m. and 5:00 p.m.  Voicemails left after 4:30 p.m. will not be returned until the following business day.  For prescription refill requests, have your pharmacy contact our office with your prescription refill request.

## 2013-09-02 NOTE — Progress Notes (Signed)
Anne [name] presents today for injection per MD orders. Procrit 40,000 units administered SQ in left Abdomen. Administration without incident. Patient tolerated well.  

## 2013-09-02 NOTE — Progress Notes (Signed)
Labs drawn today for cbc/diff,cmp,ldh,sed rate 

## 2013-09-25 HISTORY — PX: SKIN GRAFT: SHX250

## 2013-09-30 ENCOUNTER — Encounter (HOSPITAL_COMMUNITY): Payer: Medicare Other | Attending: Oncology

## 2013-09-30 VITALS — BP 146/62 | HR 69 | Resp 16

## 2013-09-30 DIAGNOSIS — N039 Chronic nephritic syndrome with unspecified morphologic changes: Secondary | ICD-10-CM

## 2013-09-30 DIAGNOSIS — D631 Anemia in chronic kidney disease: Secondary | ICD-10-CM

## 2013-09-30 DIAGNOSIS — C9110 Chronic lymphocytic leukemia of B-cell type not having achieved remission: Secondary | ICD-10-CM | POA: Insufficient documentation

## 2013-09-30 DIAGNOSIS — N189 Chronic kidney disease, unspecified: Secondary | ICD-10-CM

## 2013-09-30 DIAGNOSIS — D638 Anemia in other chronic diseases classified elsewhere: Secondary | ICD-10-CM

## 2013-09-30 LAB — CBC
HCT: 32.1 % — ABNORMAL LOW (ref 36.0–46.0)
HEMOGLOBIN: 10.2 g/dL — AB (ref 12.0–15.0)
MCH: 28.9 pg (ref 26.0–34.0)
MCHC: 31.8 g/dL (ref 30.0–36.0)
MCV: 90.9 fL (ref 78.0–100.0)
Platelets: 198 10*3/uL (ref 150–400)
RBC: 3.53 MIL/uL — AB (ref 3.87–5.11)
RDW: 15.9 % — ABNORMAL HIGH (ref 11.5–15.5)
WBC: 13.5 10*3/uL — ABNORMAL HIGH (ref 4.0–10.5)

## 2013-09-30 MED ORDER — EPOETIN ALFA 40000 UNIT/ML IJ SOLN
40000.0000 [IU] | Freq: Once | INTRAMUSCULAR | Status: AC
  Administered 2013-09-30: 40000 [IU] via SUBCUTANEOUS

## 2013-09-30 MED ORDER — EPOETIN ALFA 40000 UNIT/ML IJ SOLN
INTRAMUSCULAR | Status: AC
  Filled 2013-09-30: qty 1

## 2013-09-30 NOTE — Progress Notes (Signed)
Anne [name] presents today for injection per MD orders. Procrit 40,000 units administered SQ in right Abdomen. Administration without incident. Patient tolerated well.  

## 2013-10-28 ENCOUNTER — Encounter (HOSPITAL_COMMUNITY): Payer: Medicare Other | Attending: Oncology

## 2013-10-28 ENCOUNTER — Encounter (HOSPITAL_COMMUNITY): Payer: Medicare Other

## 2013-10-28 VITALS — BP 123/74 | HR 76 | Resp 18

## 2013-10-28 DIAGNOSIS — C9110 Chronic lymphocytic leukemia of B-cell type not having achieved remission: Secondary | ICD-10-CM

## 2013-10-28 DIAGNOSIS — D638 Anemia in other chronic diseases classified elsewhere: Secondary | ICD-10-CM

## 2013-10-28 LAB — CBC
HEMATOCRIT: 34.6 % — AB (ref 36.0–46.0)
Hemoglobin: 11 g/dL — ABNORMAL LOW (ref 12.0–15.0)
MCH: 28.4 pg (ref 26.0–34.0)
MCHC: 31.8 g/dL (ref 30.0–36.0)
MCV: 89.4 fL (ref 78.0–100.0)
Platelets: 211 10*3/uL (ref 150–400)
RBC: 3.87 MIL/uL (ref 3.87–5.11)
RDW: 15.2 % (ref 11.5–15.5)
WBC: 16.1 10*3/uL — ABNORMAL HIGH (ref 4.0–10.5)

## 2013-10-28 MED ORDER — EPOETIN ALFA 40000 UNIT/ML IJ SOLN
40000.0000 [IU] | Freq: Once | INTRAMUSCULAR | Status: AC
  Administered 2013-10-28: 40000 [IU] via SUBCUTANEOUS
  Filled 2013-10-28: qty 1

## 2013-10-28 NOTE — Progress Notes (Signed)
Anne [name] presents today for injection per MD orders. Procrit 40,000 units administered SQ in left lower abdomen. Administration without incident. Patient tolerated well.

## 2013-10-28 NOTE — Progress Notes (Unsigned)
Labs drawn for cbc 

## 2013-11-15 ENCOUNTER — Other Ambulatory Visit: Payer: Self-pay | Admitting: Internal Medicine

## 2013-11-15 IMAGING — CR DG HIP COMPLETE 2+V*R*
3 series · 3 of 3 positions shown · non-contrast
Comparison: None

CLINICAL DATA: Right hip pain.

RIGHT HIP - COMPLETE 2+ VIEW

[view not recorded (1 of 3)]
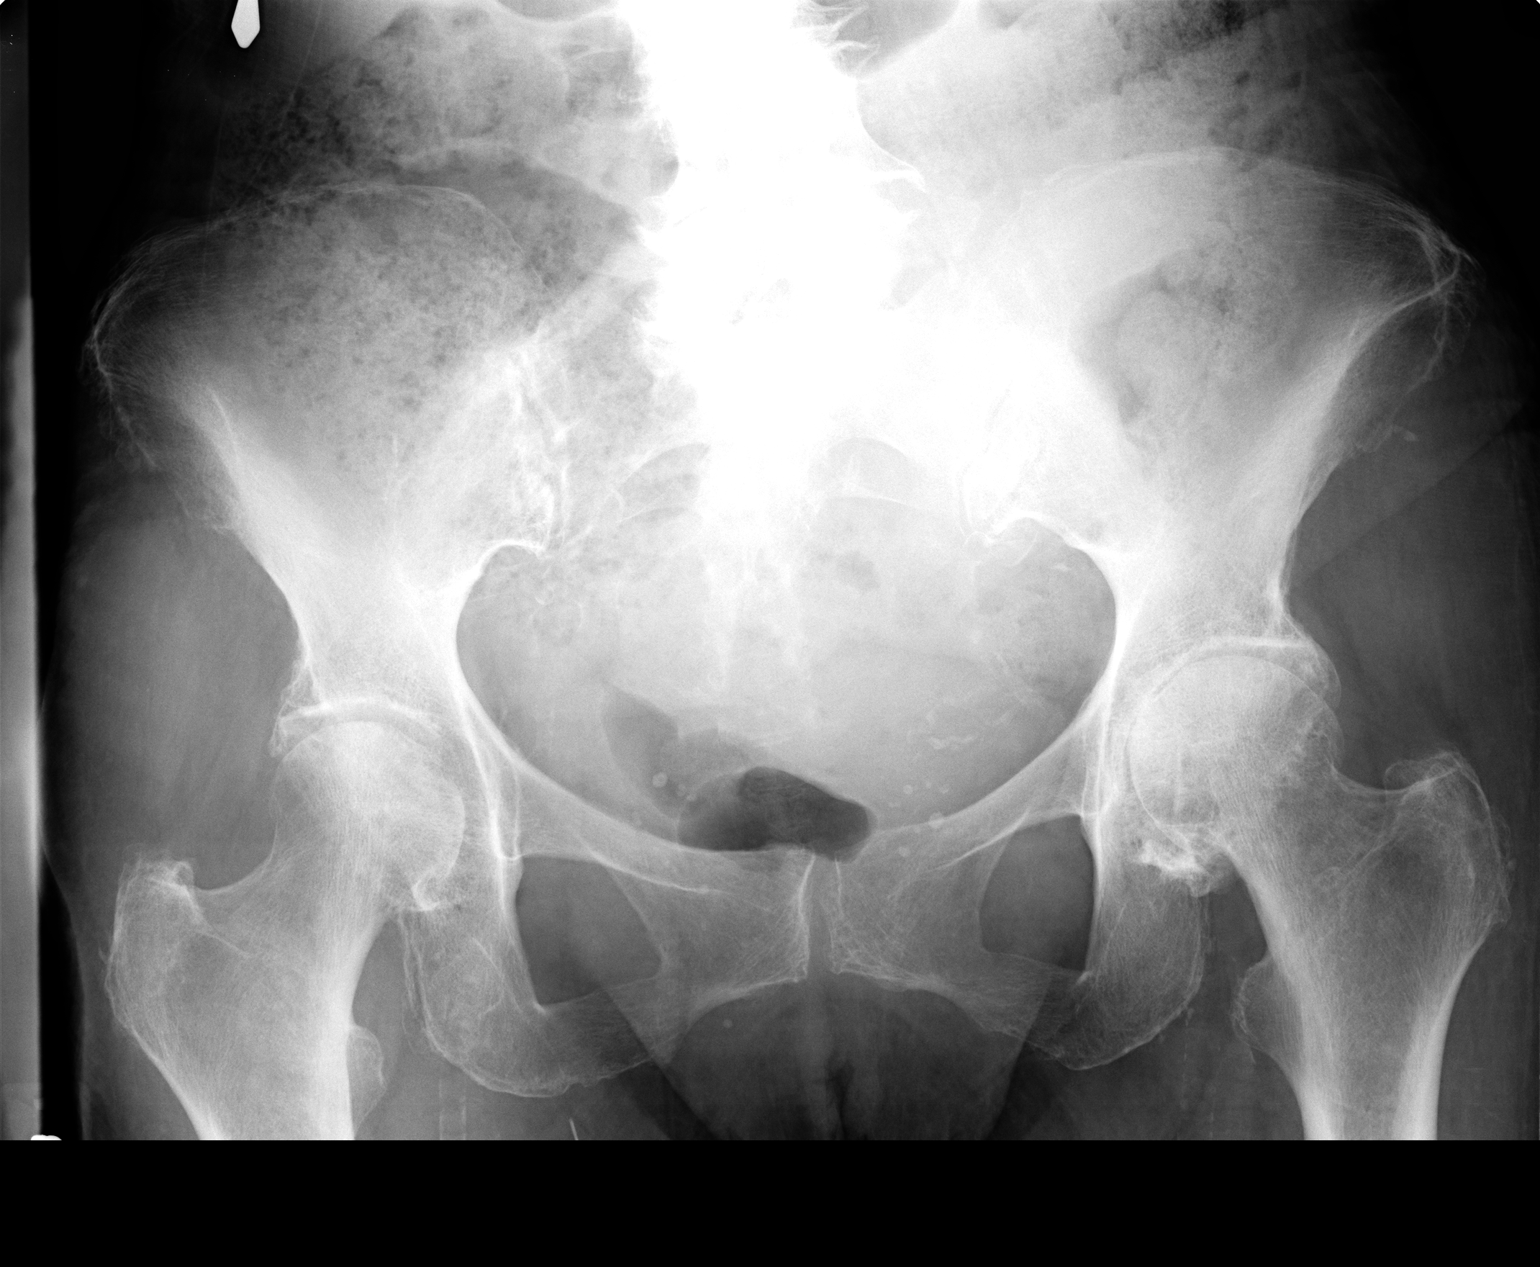

[view not recorded (2 of 3)]
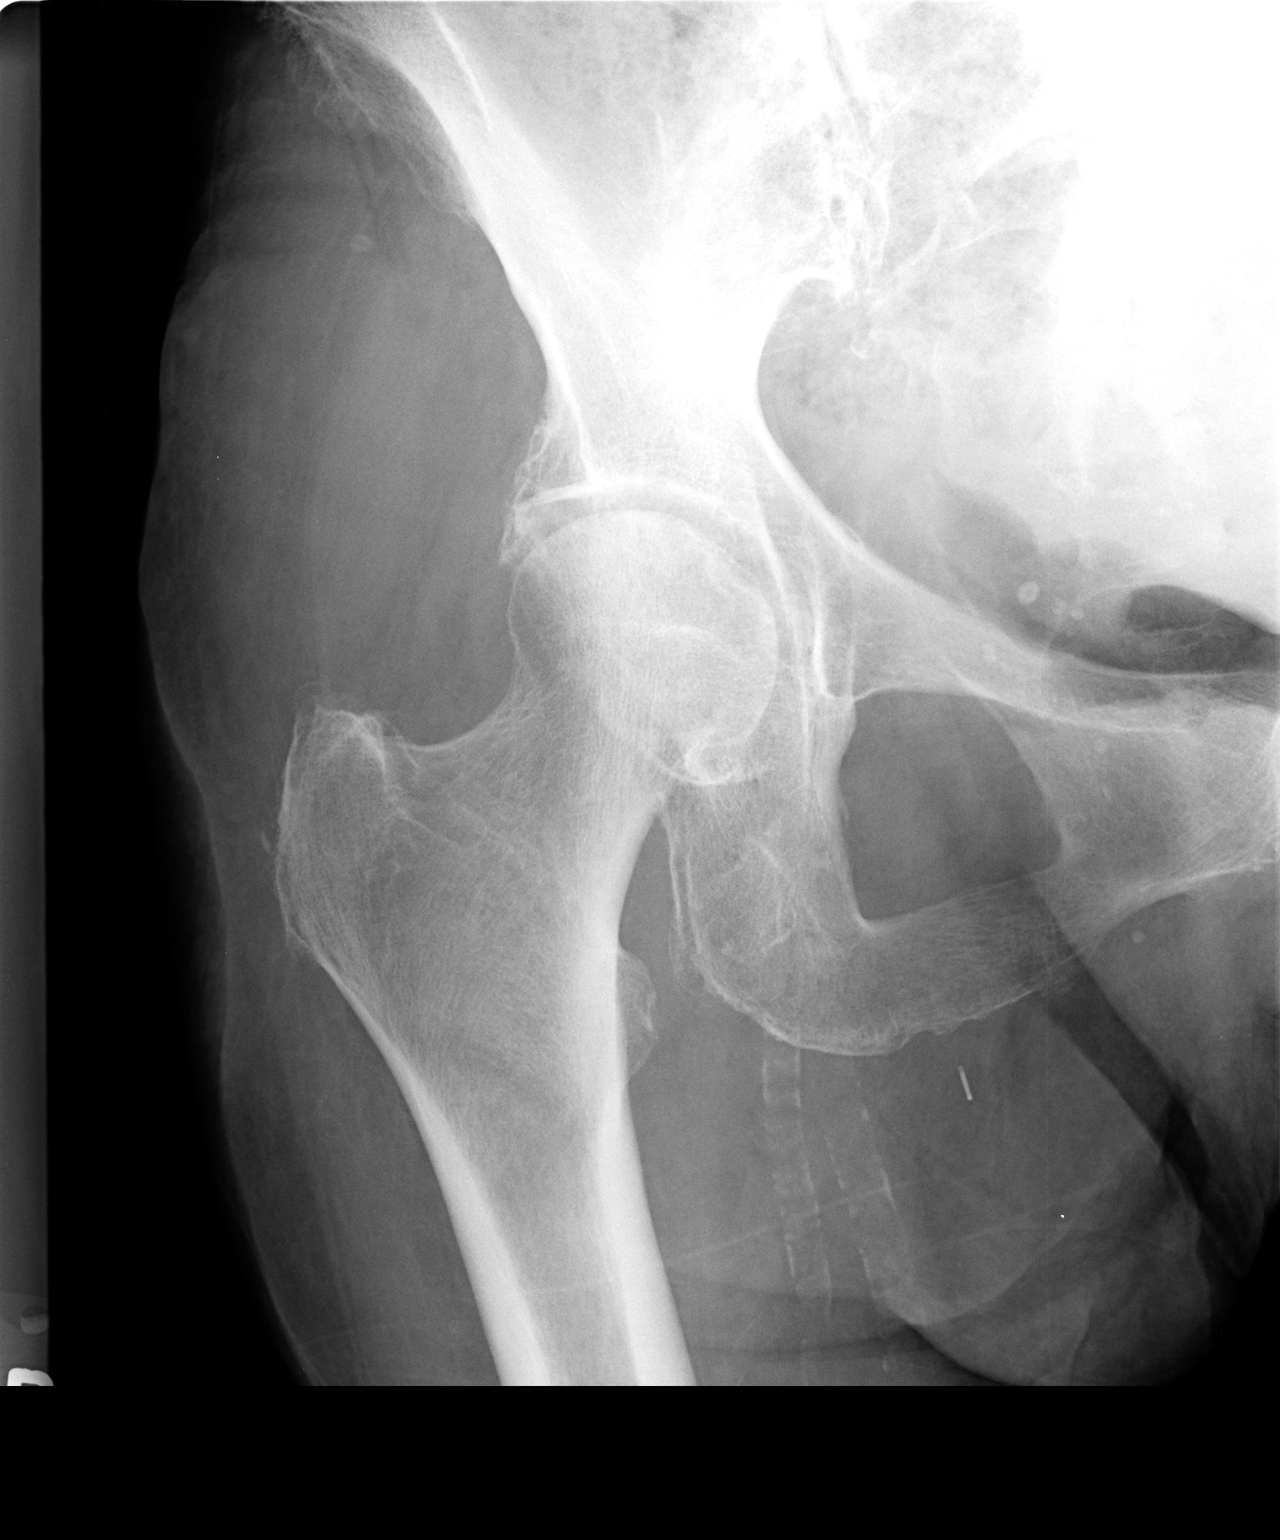

[view not recorded (3 of 3)]
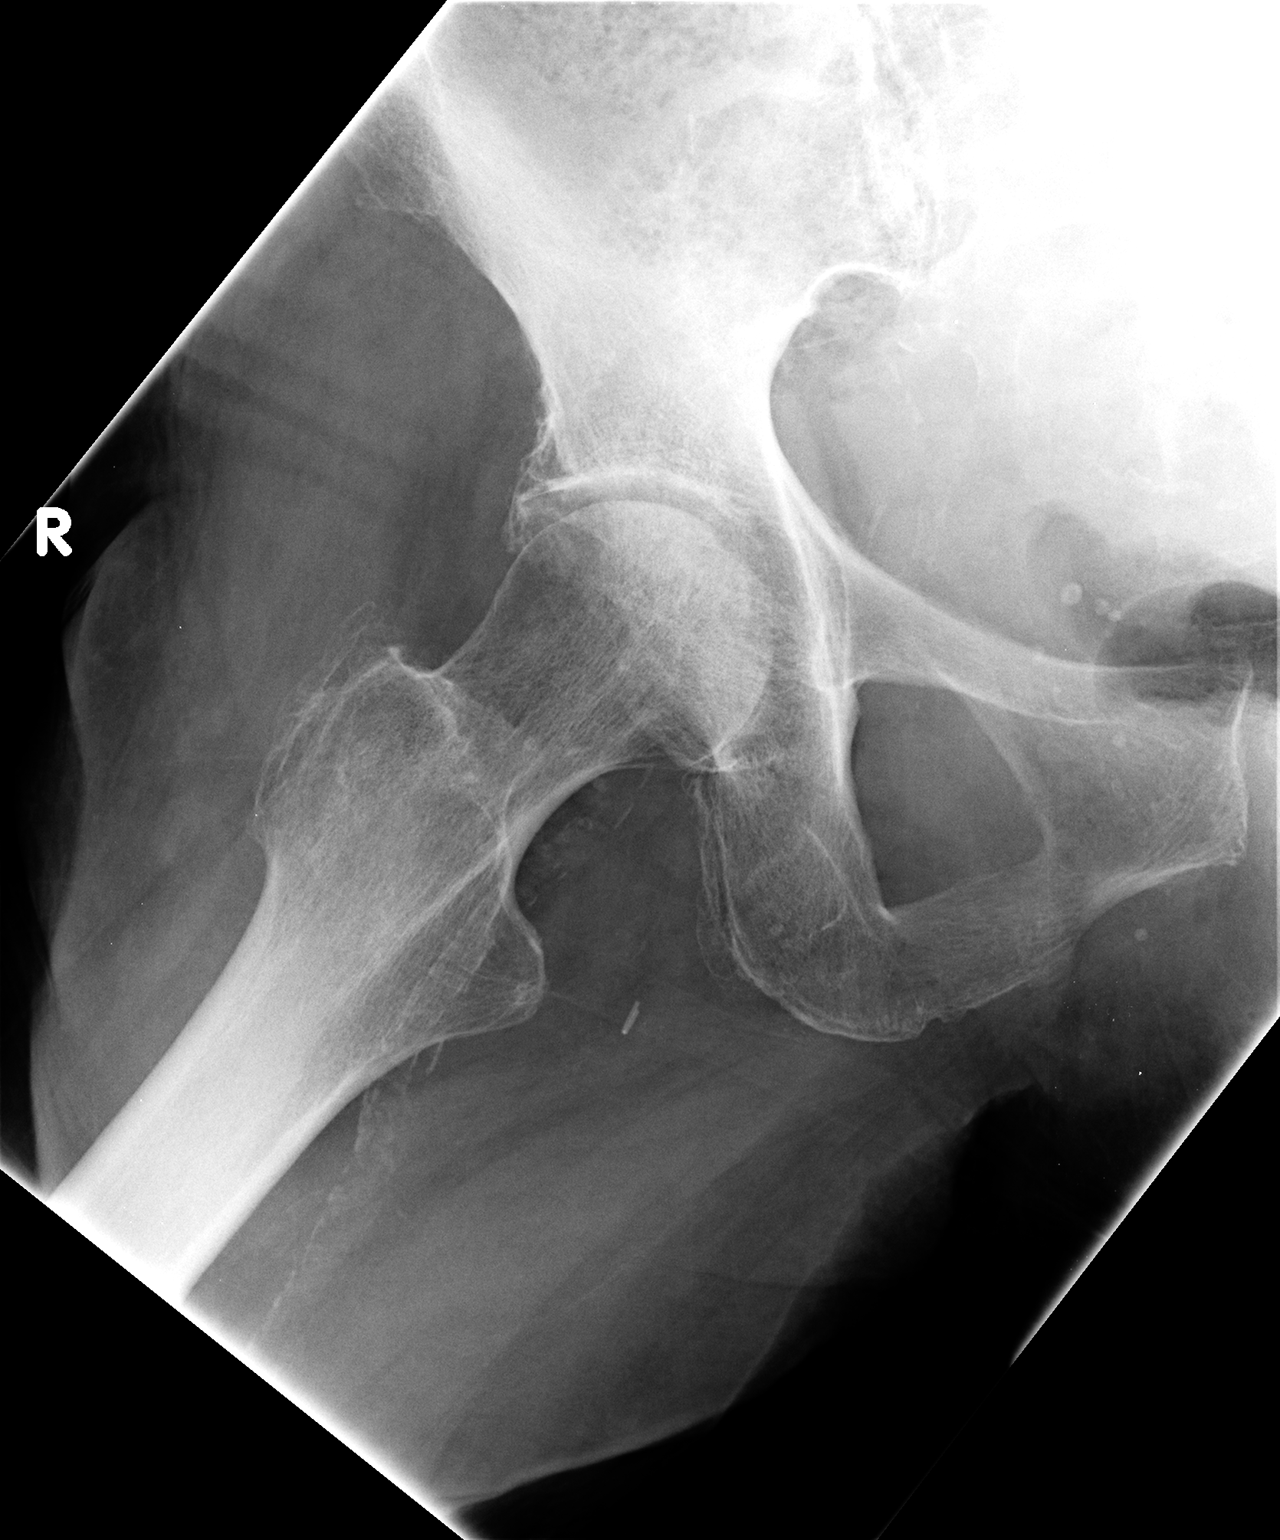

[3 of 3 positions shown; findings below may reference images not displayed]

FINDINGS: There are advanced hip joint degenerative changes, left
greater than right.  No acute fracture or plain film evidence of
avascular necrosis.  Pubic symphysis and SI joints are intact.
Vascular calcifications are noted.  Advanced degenerative changes
noted in the lower lumbar spine.
IMPRESSION: 1.  Bilateral hip joint degenerative changes, left greater than
right.
2.  No acute bony findings.

## 2013-11-23 ENCOUNTER — Encounter (HOSPITAL_COMMUNITY): Payer: Medicare Other

## 2013-11-23 ENCOUNTER — Encounter (HOSPITAL_BASED_OUTPATIENT_CLINIC_OR_DEPARTMENT_OTHER): Payer: Medicare Other

## 2013-11-23 ENCOUNTER — Encounter (HOSPITAL_COMMUNITY): Payer: Self-pay

## 2013-11-23 VITALS — BP 123/64 | HR 73 | Temp 98.1°F | Resp 18 | Wt 144.1 lb

## 2013-11-23 DIAGNOSIS — E039 Hypothyroidism, unspecified: Secondary | ICD-10-CM

## 2013-11-23 DIAGNOSIS — E1165 Type 2 diabetes mellitus with hyperglycemia: Secondary | ICD-10-CM

## 2013-11-23 DIAGNOSIS — D631 Anemia in chronic kidney disease: Secondary | ICD-10-CM

## 2013-11-23 DIAGNOSIS — C9110 Chronic lymphocytic leukemia of B-cell type not having achieved remission: Secondary | ICD-10-CM

## 2013-11-23 DIAGNOSIS — IMO0001 Reserved for inherently not codable concepts without codable children: Secondary | ICD-10-CM

## 2013-11-23 DIAGNOSIS — N183 Chronic kidney disease, stage 3 unspecified: Secondary | ICD-10-CM

## 2013-11-23 DIAGNOSIS — N039 Chronic nephritic syndrome with unspecified morphologic changes: Secondary | ICD-10-CM

## 2013-11-23 DIAGNOSIS — D638 Anemia in other chronic diseases classified elsewhere: Secondary | ICD-10-CM

## 2013-11-23 DIAGNOSIS — N189 Chronic kidney disease, unspecified: Secondary | ICD-10-CM

## 2013-11-23 LAB — COMPREHENSIVE METABOLIC PANEL
ALT: 16 U/L (ref 0–35)
AST: 27 U/L (ref 0–37)
Albumin: 3.3 g/dL — ABNORMAL LOW (ref 3.5–5.2)
Alkaline Phosphatase: 101 U/L (ref 39–117)
BILIRUBIN TOTAL: 0.3 mg/dL (ref 0.3–1.2)
BUN: 29 mg/dL — AB (ref 6–23)
CHLORIDE: 100 meq/L (ref 96–112)
CO2: 28 mEq/L (ref 19–32)
Calcium: 9.1 mg/dL (ref 8.4–10.5)
Creatinine, Ser: 1.08 mg/dL (ref 0.50–1.10)
GFR calc Af Amer: 53 mL/min — ABNORMAL LOW (ref >90)
GFR calc non Af Amer: 46 mL/min — ABNORMAL LOW (ref >90)
Glucose, Bld: 226 mg/dL — ABNORMAL HIGH (ref 70–99)
Potassium: 5.5 mEq/L — ABNORMAL HIGH (ref 3.7–5.3)
Sodium: 138 mEq/L (ref 137–147)
Total Protein: 6.6 g/dL (ref 6.0–8.3)

## 2013-11-23 LAB — SEDIMENTATION RATE: SED RATE: 43 mm/h — AB (ref 0–22)

## 2013-11-23 LAB — CBC WITH DIFFERENTIAL/PLATELET
BASOS ABS: 0 10*3/uL (ref 0.0–0.1)
Basophils Relative: 0 % (ref 0–1)
EOS ABS: 0.1 10*3/uL (ref 0.0–0.7)
EOS PCT: 1 % (ref 0–5)
HEMATOCRIT: 33.7 % — AB (ref 36.0–46.0)
Hemoglobin: 10.6 g/dL — ABNORMAL LOW (ref 12.0–15.0)
LYMPHS PCT: 76 % — AB (ref 12–46)
Lymphs Abs: 10.8 10*3/uL — ABNORMAL HIGH (ref 0.7–4.0)
MCH: 28 pg (ref 26.0–34.0)
MCHC: 31.5 g/dL (ref 30.0–36.0)
MCV: 88.9 fL (ref 78.0–100.0)
MONO ABS: 0.4 10*3/uL (ref 0.1–1.0)
Monocytes Relative: 2 % — ABNORMAL LOW (ref 3–12)
Neutro Abs: 3 10*3/uL (ref 1.7–7.7)
Neutrophils Relative %: 21 % — ABNORMAL LOW (ref 43–77)
Platelets: 195 10*3/uL (ref 150–400)
RBC: 3.79 MIL/uL — ABNORMAL LOW (ref 3.87–5.11)
RDW: 15.4 % (ref 11.5–15.5)
WBC: 14.3 10*3/uL — ABNORMAL HIGH (ref 4.0–10.5)

## 2013-11-23 LAB — LACTATE DEHYDROGENASE: LDH: 194 U/L (ref 94–250)

## 2013-11-23 MED ORDER — EPOETIN ALFA 40000 UNIT/ML IJ SOLN
INTRAMUSCULAR | Status: AC
  Filled 2013-11-23: qty 1

## 2013-11-23 MED ORDER — EPOETIN ALFA 40000 UNIT/ML IJ SOLN
40000.0000 [IU] | Freq: Once | INTRAMUSCULAR | Status: AC
  Administered 2013-11-23: 40000 [IU] via SUBCUTANEOUS

## 2013-11-23 NOTE — Addendum Note (Signed)
Addended by: Mellissa Kohut on: 11/23/2013 11:10 AM   Modules accepted: Orders

## 2013-11-23 NOTE — Progress Notes (Signed)
Anne [name]  OFFICE PROGRESS NOTE  [place], Anne Mins, MD  [place] [address]  DIAGNOSIS: CLL (chronic lymphocytic leukemia)  Anemia in chronic [name] disease  Unspecified hypothyroidism  Type II or unspecified type diabetes mellitus without mention of complication, uncontrolled  Chief Complaint  Patient presents with  . Chronic lymphocytic leukemia  . Anemia in chronic [name] disease    CURRENT THERAPY: Surveillance per NCCN guidelines with intermittent Procrit to maintain hemoglobin close to 11 with last dose of 40,000 units given on 10/28/2013 NCCN guidelines for indication for treatment of CLL:  A. Eligible for clinical trial  B. Significant disease-related symptoms  1. Fatigue (severe)  2. Night sweats  3. Weight loss  4. Fever without infection  C. Threatened end-organ function  D. Progressive bulky disease (spleen >6cm below costal margin, lymph nodes >10 cm)  E. Progressive anemia  F. Progressive thrombocytopenia  INTERVAL HISTORY: Anne [name] 80 y.o. female returns for followup of chronic lymphocytic leukemia with anemia secondary to chronic [name] disease for which intermittent Procrit therapy is given, last dose of 40,000 units given on 10/28/2013. Appetite is good with no nausea, vomiting, easy satiety, fever, night sweats, abdominal pain, diarrhea, constipation, lower extremity swelling or redness, cough, wheezing, skin rash, joint pain, headache, or seizures.  MEDICAL HISTORY: Past Medical History  Diagnosis Date  . CLL (chronic lymphocytic leukemia)     stable  . Anemia   . DM (diabetes mellitus)   . Hypercholesteremia   . DJD (degenerative joint disease) of knee     bilat  . Hypertension   . Coronary artery disease     three-vessel CAD with CABG -- x5 -- left internal mammary artery to LAD, sequential saphenous vein graft to first diagonal and first obtuse marginal, sequential saphenous vein graft  to posterior descending and obtuse marginal 2 (posterolateral), endoscopic vein harvest of righ leg  . Shortness of breath   . CHF (congestive heart failure)     [place] Class 3 Heart Failure  . Blood transfusion   . CLL (chronic lymphocytic leukemia)   . CLL (chronic lymphocytic leukemia) 04/03/2007    Stage 0 CLL    . Anemia of chronic disease 10/30/2011  . Ischemic cardiomyopathy     EF of 35% on 2D Echo done 04/07/12 moderate intraventricular dyssynchrony, moderate MR and moderate to severe TR with an RVSP of 31    INTERIM HISTORY: has CLL (chronic lymphocytic leukemia); HYPOTHYROIDISM; DIABETES MELLITUS, TYPE II, UNCONTROLLED; HYPERLIPIDEMIA; DEPRESSION; CATARACTS; HYPERTENSION; ARTHRITIS; LOW BACK PAIN, CHRONIC; BURSITIS, ACROMIOCLAVICULAR, LEFT; MEMORY LOSS; FECAL INCONTINENCE; Ischemic cardiomyopathy; Chronic systolic dysfunction of left ventricle; Anemia of chronic disease; and Pacemaker-[employer] on her problem list.    ALLERGIES:  is allergic to lisinopril.  MEDICATIONS: has a current medication list which includes the following prescription(s): aspirin, carvedilol, citalopram, diovan, docusate calcium, donepezil hcl, fish oil-omega-3 fatty acids, furosemide, glimepiride, hydrocodone-acetaminophen, iron, lidocaine, lorazepam, metformin, multiple vitamins-minerals, namenda, pravastatin, tradjenta, and tramadol.  SURGICAL HISTORY:  Past Surgical History  Procedure Laterality Date  . Coronary artery bypass graft  06/22/2009    x5 -- left internal mammary artery to LAD, sequential saphenous vein graft to first diagonal and first obtuse marginal, sequential saphenous vein graft to posterior descending and obtuse marginal 2 (posterolateral), endoscopic vein harvest of righ leg  . Goiter resection    . Thyroidectomy  1970s  . Cataract extraction, bilateral    . Rectal  abscess    . Replacement total knee bilateral      bilateral  . Cardiac catheterization  01/18/2011    Est. EF of 25% to 30 -  Mildly reduced cardiac output with no signs of decompensated heart failure and normal right heart filling pressures -- Patent bypass grafts with underlying three-vessel coronary artery disease  . Cardiac catheterization  06/14/2009    Est. EF of of 45% - Multivessel coronary artery disease with high-grade lesions in the LAD, left circumflex, and RCA -- Mildly reduced left ventricular function -- No significant aortic stenosis or mitral regurgitation  . Pacemaker insertion  04/16/2011    biventricular pacemaker insertion  . Eye surgery    . Steroid injections to sacral spine 01/28/13 - dr Brien Few  01/28/13  . Skin graft  09/2013    nose    FAMILY HISTORY: family history includes Heart attack (age of onset: 46) in her brother; Heart disease (age of onset: 79) in her son; Heart failure (age of onset: 50) in her brother; Hypertension in her son.  SOCIAL HISTORY:  reports that she has never smoked. She has never used smokeless tobacco. She reports that she does not drink alcohol or use illicit drugs.  REVIEW OF SYSTEMS:  Other than that discussed above is noncontributory.  PHYSICAL EXAMINATION: ECOG PERFORMANCE STATUS: 1 - Symptomatic but completely ambulatory  Blood pressure 123/64, pulse 73, temperature 98.1 F (36.7 C), temperature source Oral, resp. rate 18, weight 144 lb 1.6 oz (65.363 kg).  GENERAL:alert, no distress and comfortable SKIN: skin color, texture, turgor are normal, no rashes or significant lesions EYES: PERLA; Conjunctiva are pink and non-injected, sclera clear SINUSES: No redness or tenderness over maxillary or ethmoid sinuses OROPHARYNX:no exudate, no erythema on lips, buccal mucosa, or tongue. NECK: supple, thyroid normal size, non-tender, without nodularity. No masses CHEST: Normal AP diameter with no breast masses. Midline sternotomy scar is well-healed. Pacemaker in place. LYMPH:  no palpable lymphadenopathy in the cervical, axillary or inguinal LUNGS: clear to  auscultation and percussion with normal breathing effort HEART: regular rate & rhythm and no murmurs. ABDOMEN:abdomen soft, non-tender and normal bowel sounds. Spleen palpable 4 cm below left costal margin. MUSCULOSKELETAL:no cyanosis of digits and no clubbing. Range of motion normal.  NEURO: alert & oriented x 3 with fluent speech, no focal motor/sensory deficits   LABORATORY DATA: Appointment on 11/23/2013  Component Date Value Ref Range Status  . WBC 11/23/2013 14.3* 4.0 - 10.5 K/uL Final  . RBC 11/23/2013 3.79* 3.87 - 5.11 MIL/uL Final  . Hemoglobin 11/23/2013 10.6* 12.0 - 15.0 g/dL Final  . HCT 11/23/2013 33.7* 36.0 - 46.0 % Final  . MCV 11/23/2013 88.9  78.0 - 100.0 fL Final  . MCH 11/23/2013 28.0  26.0 - 34.0 pg Final  . MCHC 11/23/2013 31.5  30.0 - 36.0 g/dL Final  . RDW 11/23/2013 15.4  11.5 - 15.5 % Final  . Platelets 11/23/2013 195  150 - 400 K/uL Final  . Neutrophils Relative % 11/23/2013 21* 43 - 77 % Final  . Neutro Abs 11/23/2013 3.0  1.7 - 7.7 K/uL Final  . Lymphocytes Relative 11/23/2013 76* 12 - 46 % Final  . Lymphs Abs 11/23/2013 10.8* 0.7 - 4.0 K/uL Final  . Monocytes Relative 11/23/2013 2* 3 - 12 % Final  . Monocytes Absolute 11/23/2013 0.4  0.1 - 1.0 K/uL Final  . Eosinophils Relative 11/23/2013 1  0 - 5 % Final  . Eosinophils Absolute 11/23/2013 0.1  0.0 -  0.7 K/uL Final  . Basophils Relative 11/23/2013 0  0 - 1 % Final  . Basophils Absolute 11/23/2013 0.0  0.0 - 0.1 K/uL Final  . WBC Morphology 11/23/2013 ABSOLUTE LYMPHOCYTOSIS   Final   Comment: ATYPICAL LYMPHOCYTES                          SMUDGE CELLS  . Sodium 11/23/2013 138  137 - 147 mEq/L Final  . Potassium 11/23/2013 5.5* 3.7 - 5.3 mEq/L Final  . Chloride 11/23/2013 100  96 - 112 mEq/L Final  . CO2 11/23/2013 28  19 - 32 mEq/L Final  . Glucose, Bld 11/23/2013 226* 70 - 99 mg/dL Final  . BUN 11/23/2013 29* 6 - 23 mg/dL Final  . Creatinine, Ser 11/23/2013 1.08  0.50 - 1.10 mg/dL Final  . Calcium  11/23/2013 9.1  8.4 - 10.5 mg/dL Final  . Total Protein 11/23/2013 6.6  6.0 - 8.3 g/dL Final  . Albumin 11/23/2013 3.3* 3.5 - 5.2 g/dL Final  . AST 11/23/2013 27  0 - 37 U/L Final  . ALT 11/23/2013 16  0 - 35 U/L Final  . Alkaline Phosphatase 11/23/2013 101  39 - 117 U/L Final  . Total Bilirubin 11/23/2013 0.3  0.3 - 1.2 mg/dL Final  . GFR calc non Af Amer 11/23/2013 46* >90 mL/min Final  . GFR calc Af Amer 11/23/2013 53* >90 mL/min Final   Comment: (NOTE)                          The eGFR has been calculated using the CKD EPI equation.                          This calculation has not been validated in all clinical situations.                          eGFR's persistently <90 mL/min signify possible Chronic [name]                          Disease.  Marland Kitchen LDH 11/23/2013 194  94 - 250 U/L Final  Infusion on 10/28/2013  Component Date Value Ref Range Status  . WBC 10/28/2013 16.1* 4.0 - 10.5 K/uL Final  . RBC 10/28/2013 3.87  3.87 - 5.11 MIL/uL Final  . Hemoglobin 10/28/2013 11.0* 12.0 - 15.0 g/dL Final  . HCT 10/28/2013 34.6* 36.0 - 46.0 % Final  . MCV 10/28/2013 89.4  78.0 - 100.0 fL Final  . MCH 10/28/2013 28.4  26.0 - 34.0 pg Final  . MCHC 10/28/2013 31.8  30.0 - 36.0 g/dL Final  . RDW 10/28/2013 15.2  11.5 - 15.5 % Final  . Platelets 10/28/2013 211  150 - 400 K/uL Final    PATHOLOGY: Peripheral smear failed to reveal evidence of premature forms. Smudge cells are seen.  Urinalysis    Component Value Date/Time   COLORURINE YELLOW 06/14/2009 0838   APPEARANCEUR CLOUDY* 06/14/2009 0838   LABSPEC 1.010 06/14/2009 0838   PHURINE 6.0 06/14/2009 0838   GLUCOSEU NEGATIVE 06/14/2009 0838   HGBUR NEGATIVE 06/14/2009 0838   BILIRUBINUR NEGATIVE 06/14/2009 0838   KETONESUR NEGATIVE 06/14/2009 0838   PROTEINUR NEGATIVE 06/14/2009 0838   UROBILINOGEN 0.2 06/14/2009 0838   NITRITE POSITIVE* 06/14/2009 0838   LEUKOCYTESUR LARGE* 06/14/2009 0838    RADIOGRAPHIC  STUDIES: No results  found.  ASSESSMENT:  1. Chronic lymphocytic leukemia, no need for intervention at this time.  2. Anemia secondary to chronic [name] disease +/- CLL.  3. Chronic renal disease, Grade 3B  4. Degenerative joint disease left knee, status post total knee replacement, symptomatic.  5. Diabetes mellitus, type II, non-insulin requiring, controlled.  6. Hypertension, controlled.  7. Coronary artery disease, status post CABG x5 for three-vessel disease, diastolic dysfunction controlled well with dual chamber pacemaker    PLAN:  #1. Procrit 40,000 units subcutaneously monthly if hemoglobin is less than 11. #2. Followup in 3 months with CBC, ferritin, and reticulocyte count.   All questions were answered. The patient knows to call the clinic with any problems, questions or concerns. We can certainly see the patient much sooner if necessary.   I spent 25 minutes counseling the patient face to face. The total time spent in the appointment was 30 minutes.    Doroteo Bradford, MD 11/23/2013 10:50 AM  DISCLAIMER:  This note was dictated with voice recognition software.  Similar sounding words can inadvertently be transcribed inaccurately and may not be corrected upon review.

## 2013-11-23 NOTE — Patient Instructions (Signed)
[facility] [place] Discharge Instructions  RECOMMENDATIONS MADE BY THE CONSULTANT AND ANY TEST RESULTS WILL BE SENT TO YOUR REFERRING PHYSICIAN.  EXAM FINDINGS BY THE PHYSICIAN TODAY AND SIGNS OR SYMPTOMS TO REPORT TO CLINIC OR PRIMARY PHYSICIAN: Exam and findings as discussed by Dr. Barnet Glasgow.  If your hemoglobin is 11 or below we will give you procrit today.  Report increased fatigue or shortness of breath.  MEDICATIONS PRESCRIBED:  none  INSTRUCTIONS/FOLLOW-UP: Follow-up every 4 weeks for labs and possible procrit and in 3 months to see MD.  Thank you for choosing [facility] to provide your oncology and hematology care.  To afford each patient quality time with our providers, please arrive at least 15 minutes before your scheduled appointment time.  With your help, our goal is to use those 15 minutes to complete the necessary work-up to ensure our physicians have the information they need to help with your evaluation and healthcare recommendations.    Effective January 1st, 2014, we ask that you re-schedule your appointment with our physicians should you arrive 10 or more minutes late for your appointment.  We strive to give you quality time with our providers, and arriving late affects you and other patients whose appointments are after yours.    Again, thank you for choosing [facility].  Our hope is that these requests will decrease the amount of time that you wait before being seen by our physicians.       _____________________________________________________________  Should you have questions after your visit to [facility], please contact our office at [phone] between the hours of 8:30 a.m. and 4:30 p.m.  Voicemails left after 4:30 p.m. will not be returned until the following business day.  For prescription refill requests, have your pharmacy contact our office with your prescription refill request.     _______________________________________________________________  We hope that we have given you very good care.  You may receive a patient satisfaction survey in the mail, please complete it and return it as soon as possible.  We value your feedback!

## 2013-11-23 NOTE — Progress Notes (Signed)
Labs drawn for cbcd, cmp, ldh, esr,

## 2013-11-23 NOTE — Progress Notes (Signed)
Anne [name] presents today for injection per MD orders. Procrit 40000 administered SQ in right Abdomen. Administration without incident. Patient tolerated well.

## 2013-11-25 ENCOUNTER — Ambulatory Visit (HOSPITAL_COMMUNITY): Payer: Medicare Other | Admitting: Oncology

## 2013-12-02 ENCOUNTER — Ambulatory Visit (HOSPITAL_COMMUNITY): Payer: Medicare Other | Admitting: Oncology

## 2013-12-21 ENCOUNTER — Encounter (HOSPITAL_BASED_OUTPATIENT_CLINIC_OR_DEPARTMENT_OTHER): Payer: Medicare Other

## 2013-12-21 ENCOUNTER — Encounter (HOSPITAL_COMMUNITY): Payer: Medicare Other | Attending: Hematology

## 2013-12-21 VITALS — BP 116/58 | HR 70 | Temp 98.3°F | Resp 16

## 2013-12-21 DIAGNOSIS — N039 Chronic nephritic syndrome with unspecified morphologic changes: Secondary | ICD-10-CM

## 2013-12-21 DIAGNOSIS — C9110 Chronic lymphocytic leukemia of B-cell type not having achieved remission: Secondary | ICD-10-CM | POA: Diagnosis not present

## 2013-12-21 DIAGNOSIS — N189 Chronic kidney disease, unspecified: Secondary | ICD-10-CM

## 2013-12-21 DIAGNOSIS — D638 Anemia in other chronic diseases classified elsewhere: Secondary | ICD-10-CM | POA: Diagnosis not present

## 2013-12-21 DIAGNOSIS — D631 Anemia in chronic kidney disease: Secondary | ICD-10-CM

## 2013-12-21 LAB — CBC
HCT: 33.3 % — ABNORMAL LOW (ref 36.0–46.0)
Hemoglobin: 10.6 g/dL — ABNORMAL LOW (ref 12.0–15.0)
MCH: 28.3 pg (ref 26.0–34.0)
MCHC: 31.8 g/dL (ref 30.0–36.0)
MCV: 88.8 fL (ref 78.0–100.0)
PLATELETS: 195 10*3/uL (ref 150–400)
RBC: 3.75 MIL/uL — ABNORMAL LOW (ref 3.87–5.11)
RDW: 15.8 % — ABNORMAL HIGH (ref 11.5–15.5)
WBC: 16.9 10*3/uL — ABNORMAL HIGH (ref 4.0–10.5)

## 2013-12-21 MED ORDER — EPOETIN ALFA 40000 UNIT/ML IJ SOLN
40000.0000 [IU] | Freq: Once | INTRAMUSCULAR | Status: AC
  Administered 2013-12-21: 40000 [IU] via SUBCUTANEOUS

## 2013-12-21 MED ORDER — EPOETIN ALFA 40000 UNIT/ML IJ SOLN
INTRAMUSCULAR | Status: AC
  Filled 2013-12-21: qty 1

## 2013-12-21 NOTE — Progress Notes (Signed)
Kamiryn A Demartini presents today for injection per MD orders. Procrit 40,000 units administered SQ in left Abdomen. Administration without incident. Patient tolerated well.  

## 2013-12-21 NOTE — Progress Notes (Signed)
LABS DRAWN FOR CBC 

## 2014-01-18 ENCOUNTER — Encounter (HOSPITAL_COMMUNITY): Payer: Medicare Other | Attending: Oncology

## 2014-01-18 ENCOUNTER — Encounter (HOSPITAL_COMMUNITY): Payer: Medicare Other | Attending: Hematology and Oncology

## 2014-01-18 VITALS — BP 133/75 | HR 68 | Temp 98.0°F | Resp 16

## 2014-01-18 DIAGNOSIS — N039 Chronic nephritic syndrome with unspecified morphologic changes: Secondary | ICD-10-CM

## 2014-01-18 DIAGNOSIS — D631 Anemia in chronic kidney disease: Secondary | ICD-10-CM

## 2014-01-18 DIAGNOSIS — N183 Chronic kidney disease, stage 3 unspecified: Secondary | ICD-10-CM

## 2014-01-18 DIAGNOSIS — C9110 Chronic lymphocytic leukemia of B-cell type not having achieved remission: Secondary | ICD-10-CM | POA: Insufficient documentation

## 2014-01-18 DIAGNOSIS — D638 Anemia in other chronic diseases classified elsewhere: Secondary | ICD-10-CM | POA: Insufficient documentation

## 2014-01-18 LAB — CBC
HCT: 34 % — ABNORMAL LOW (ref 36.0–46.0)
Hemoglobin: 10.8 g/dL — ABNORMAL LOW (ref 12.0–15.0)
MCH: 27.9 pg (ref 26.0–34.0)
MCHC: 31.8 g/dL (ref 30.0–36.0)
MCV: 87.9 fL (ref 78.0–100.0)
PLATELETS: 176 10*3/uL (ref 150–400)
RBC: 3.87 MIL/uL (ref 3.87–5.11)
RDW: 16.3 % — ABNORMAL HIGH (ref 11.5–15.5)
WBC: 16.8 10*3/uL — ABNORMAL HIGH (ref 4.0–10.5)

## 2014-01-18 MED ORDER — EPOETIN ALFA 40000 UNIT/ML IJ SOLN
INTRAMUSCULAR | Status: AC
Start: 2014-01-18 — End: 2014-01-18
  Filled 2014-01-18: qty 1

## 2014-01-18 MED ORDER — EPOETIN ALFA 40000 UNIT/ML IJ SOLN
40000.0000 [IU] | Freq: Once | INTRAMUSCULAR | Status: AC
  Administered 2014-01-18: 40000 [IU] via SUBCUTANEOUS

## 2014-01-18 NOTE — Progress Notes (Signed)
Labs for cbc 

## 2014-01-18 NOTE — Progress Notes (Signed)
Anne [name] presents today for injection per MD orders. Procrit 40,000 units administered SQ in left Abdomen. Administration without incident. Patient tolerated well.  

## 2014-01-25 ENCOUNTER — Ambulatory Visit (INDEPENDENT_AMBULATORY_CARE_PROVIDER_SITE_OTHER): Payer: Medicare Other | Admitting: Internal Medicine

## 2014-01-25 ENCOUNTER — Encounter: Payer: Self-pay | Admitting: Internal Medicine

## 2014-01-25 VITALS — BP 120/60 | HR 74 | Ht 64.0 in | Wt 143.3 lb

## 2014-01-25 DIAGNOSIS — IMO0001 Reserved for inherently not codable concepts without codable children: Secondary | ICD-10-CM

## 2014-01-25 DIAGNOSIS — Z950 Presence of cardiac pacemaker: Secondary | ICD-10-CM

## 2014-01-25 DIAGNOSIS — I10 Essential (primary) hypertension: Secondary | ICD-10-CM

## 2014-01-25 DIAGNOSIS — E1165 Type 2 diabetes mellitus with hyperglycemia: Secondary | ICD-10-CM

## 2014-01-25 DIAGNOSIS — Z951 Presence of aortocoronary bypass graft: Secondary | ICD-10-CM

## 2014-01-25 DIAGNOSIS — E785 Hyperlipidemia, unspecified: Secondary | ICD-10-CM

## 2014-01-25 DIAGNOSIS — I2589 Other forms of chronic ischemic heart disease: Secondary | ICD-10-CM

## 2014-01-25 DIAGNOSIS — I2510 Atherosclerotic heart disease of native coronary artery without angina pectoris: Secondary | ICD-10-CM

## 2014-01-25 NOTE — Patient Instructions (Signed)

## 2014-01-25 NOTE — Progress Notes (Signed)
OFFICE NOTE  Chief Complaint:  Routine follow-up  Primary Care Physician: [name], MD  HPI:  Anne [name] is an 80 year old female with a history of ischemic cardiomyopathy, EF of 25-30% and marked intraventricular conduction delay. She had a BiV pacemaker placed by Dr. Rayann Heman in March and recently had that adjusted. She had an echocardiogram on April 07, 2012. This showed borderline dilated left ventricle with an EF of 35%. There is still moderate intraventricular dyssynchrony, however, improved from previous studies. The left atrium is mildly dilated. There is moderate MR and moderate to severe TR with an RVSP of 31.   Anne [name] returns today for followup. She denies any worsening shortness of breath. She scheduled to see Dr. Rayann Heman in December. Her EF has been at 35% as of November of 2013. She is due for reassessment with echo. Her weight is stable. She denies any chest pain.  PMHx:  Past Medical History  Diagnosis Date  . CLL (chronic lymphocytic leukemia)     stable  . Anemia   . DM (diabetes mellitus)   . Hypercholesteremia   . DJD (degenerative joint disease) of knee     bilat  . Hypertension   . Coronary artery disease     three-vessel CAD with CABG -- x5 -- left internal mammary artery to LAD, sequential saphenous vein graft to first diagonal and first obtuse marginal, sequential saphenous vein graft to posterior descending and obtuse marginal 2 (posterolateral), endoscopic vein harvest of righ leg  . Shortness of breath   . CHF (congestive heart failure)     [place] Class 3 Heart Failure  . Blood transfusion   . CLL (chronic lymphocytic leukemia)   . CLL (chronic lymphocytic leukemia) 04/03/2007    Stage 0 CLL    . Anemia of chronic disease 10/30/2011  . Ischemic cardiomyopathy     EF of 35% on 2D Echo done 04/07/12 moderate intraventricular dyssynchrony, moderate MR and moderate to severe TR with an RVSP of 31    Past Surgical History  Procedure  Laterality Date  . Coronary artery bypass graft  06/22/2009    x5 -- left internal mammary artery to LAD, sequential saphenous vein graft to first diagonal and first obtuse marginal, sequential saphenous vein graft to posterior descending and obtuse marginal 2 (posterolateral), endoscopic vein harvest of righ leg  . Goiter resection    . Thyroidectomy  1970s  . Cataract extraction, bilateral    . Rectal abscess    . Replacement total knee bilateral      bilateral  . Cardiac catheterization  01/18/2011    Est. EF of 25% to 30 - Mildly reduced cardiac output with no signs of decompensated heart failure and normal right heart filling pressures -- Patent bypass grafts with underlying three-vessel coronary artery disease  . Cardiac catheterization  06/14/2009    Est. EF of of 45% - Multivessel coronary artery disease with high-grade lesions in the LAD, left circumflex, and RCA -- Mildly reduced left ventricular function -- No significant aortic stenosis or mitral regurgitation  . Pacemaker insertion  04/16/2011    biventricular pacemaker insertion  . Eye surgery    . Steroid injections to sacral spine 01/28/13 - dr Brien Few  01/28/13  . Skin graft  09/2013    nose    FAMHx:  Family History  Problem Relation Age of Onset  . Heart failure Brother 82  . Heart attack Brother 55  . Heart disease Son 21  Had CABG x 1  . Hypertension Son     SOCHx:   reports that she has never smoked. She has never used smokeless tobacco. She reports that she does not drink alcohol or use illicit drugs.  ALLERGIES:  Allergies  Allergen Reactions  . Lisinopril     REACTION: Dry Cough    ROS: A comprehensive review of systems was negative.  HOME MEDS: Current Outpatient Prescriptions  Medication Sig Dispense Refill  . aspirin 81 MG EC tablet Take 81 mg by mouth daily.       . carvedilol (COREG) 6.25 MG tablet Take 1 tablet (6.25 mg total) by mouth 2 (two) times daily with a meal.  270 tablet  0  .  citalopram (CELEXA) 10 MG tablet Take 10 mg by mouth 2 (two) times daily.       Marland Kitchen DIOVAN 160 MG tablet Take 160 mg by mouth daily. & 1/2 tab on M, W, F nights      . Docusate Calcium (STOOL SOFTENER PO) Take 2 capsules by mouth as needed.       . Donepezil HCl (ARICEPT PO) Take 5 mg by mouth 3 (three) times a week.       . fish oil-omega-3 fatty acids 1000 MG capsule Take 1 g by mouth daily.      . furosemide (LASIX) 40 MG tablet Take 20 mg by mouth daily.      Marland Kitchen HYDROcodone-acetaminophen (NORCO) 7.5-325 MG per tablet Take 1 tablet by mouth every 8 (eight) hours as needed.       . Insulin Detemir (LEVEMIR [place]) Inject 5 Units into the skin daily.      . IRON CR PO Take 1 tablet by mouth 3 (three) times a week.      Marland Kitchen LC-4 LIDOCAINE EX Apply topically 3 (three) times daily as needed. To lt knee      . LORazepam (ATIVAN) 0.5 MG tablet Take 0.5 mg by mouth every 8 (eight) hours as needed. FOR NERVES      . metFORMIN (GLUCOPHAGE) 1000 MG tablet Take 1,000 mg by mouth 2 (two) times daily with a meal.       . Multiple Vitamins-Minerals (CENTRUM SILVER PO) Take 1 tablet by mouth daily.       Marland Kitchen NAMENDA 10 MG tablet Take 10 mg by mouth 2 (two) times daily.       . pravastatin (PRAVACHOL) 40 MG tablet Take 1 tablet (40 mg total) by mouth daily.  90 tablet  2  . TRADJENTA 5 MG TABS tablet Take 5 mg by mouth at bedtime.       . traMADol (ULTRAM) 50 MG tablet Take 50 mg by mouth at bedtime.        No current facility-administered medications for this visit.    LABS/IMAGING: No results found for this or any previous visit (from the past 48 hour(s)). No results found.  VITALS: BP 120/60  Pulse 74  Ht 5\' 4"  (1.626 m)  Wt 143 lb 4.8 oz (65 kg)  BMI 24.59 kg/m2  EXAM: General appearance: alert and no distress Neck: no adenopathy, no carotid bruit, no JVD, supple, symmetrical, trachea midline and thyroid not enlarged, symmetric, no tenderness/mass/nodules Lungs: clear to auscultation  bilaterally Heart: regular rate and rhythm, S1, S2 normal, no murmur, click, rub or gallop Abdomen: soft, non-tender; bowel sounds normal; no masses,  no organomegaly Extremities: extremities normal, atraumatic, no cyanosis or edema Pulses: 2+ and symmetric Skin: Skin color, texture, turgor  normal. No rashes or lesions Neurologic: Grossly normal  EKG: Normal sinus rhythm with sinus arrhythmia, interventricular conduction delay, T-wave changes across the precordium  ASSESSMENT: 1. Ischemic cardiomyopathy, EF 35%, status post biventricular pacing with New York Heart Association class II symptoms 2.   Hypertension 3.   Dyslipidemia 4.   Coronary artery disease status post CABG x5 in 2011 5.   Low back pain 6.   Diabetes type 2-recently started on insulin  PLAN: 1.   Ms. Chavira has good control of her blood pressure today. Her weight has been stable. Cholesterol is at goal. Recent laboratory work shows stable kidney function with a high potassium. I've asked her to work on reducing potassium in her diet. She denies any chest pain or signs of worsening ischemia. With regards to her cardiomyopathy, we have not reassessed her ejection fraction in 2 years since she had placement of her biventricular pacemaker, I would recommend a repeat echo to see if her EF has improved. Otherwise, we will plan to see her back in one year or sooner as necessary.  Pixie Casino, MD, [facility] Attending Cardiologist [facility] [place] [facility] 01/25/2014, 12:41 PM

## 2014-01-27 ENCOUNTER — Ambulatory Visit (HOSPITAL_COMMUNITY)
Admission: RE | Admit: 2014-01-27 | Discharge: 2014-01-27 | Disposition: A | Discharge status: Home / Self Care | Payer: Medicare Other | Source: Ambulatory Visit | Attending: Cardiovascular Disease | Admitting: Cardiovascular Disease

## 2014-01-27 DIAGNOSIS — E119 Type 2 diabetes mellitus without complications: Secondary | ICD-10-CM | POA: Diagnosis not present

## 2014-01-27 DIAGNOSIS — E785 Hyperlipidemia, unspecified: Secondary | ICD-10-CM | POA: Insufficient documentation

## 2014-01-27 DIAGNOSIS — R0609 Other forms of dyspnea: Secondary | ICD-10-CM | POA: Insufficient documentation

## 2014-01-27 DIAGNOSIS — I079 Rheumatic tricuspid valve disease, unspecified: Secondary | ICD-10-CM | POA: Insufficient documentation

## 2014-01-27 DIAGNOSIS — R0989 Other specified symptoms and signs involving the circulatory and respiratory systems: Secondary | ICD-10-CM | POA: Insufficient documentation

## 2014-01-27 DIAGNOSIS — Z950 Presence of cardiac pacemaker: Secondary | ICD-10-CM | POA: Diagnosis not present

## 2014-01-27 DIAGNOSIS — I509 Heart failure, unspecified: Secondary | ICD-10-CM | POA: Insufficient documentation

## 2014-01-27 DIAGNOSIS — I255 Ischemic cardiomyopathy: Secondary | ICD-10-CM

## 2014-01-27 DIAGNOSIS — Z8249 Family history of ischemic heart disease and other diseases of the circulatory system: Secondary | ICD-10-CM | POA: Diagnosis not present

## 2014-01-27 DIAGNOSIS — I2510 Atherosclerotic heart disease of native coronary artery without angina pectoris: Secondary | ICD-10-CM | POA: Insufficient documentation

## 2014-01-27 DIAGNOSIS — I428 Other cardiomyopathies: Secondary | ICD-10-CM | POA: Diagnosis present

## 2014-01-27 DIAGNOSIS — I369 Nonrheumatic tricuspid valve disorder, unspecified: Secondary | ICD-10-CM

## 2014-01-27 DIAGNOSIS — I10 Essential (primary) hypertension: Secondary | ICD-10-CM | POA: Insufficient documentation

## 2014-01-27 DIAGNOSIS — I2589 Other forms of chronic ischemic heart disease: Secondary | ICD-10-CM

## 2014-01-27 NOTE — Progress Notes (Signed)
2D Echocardiogram Complete.  01/27/2014   Minha Fulco Milligan, RDCS

## 2014-01-28 ENCOUNTER — Telehealth: Payer: Self-pay | Admitting: *Deleted

## 2014-01-28 NOTE — Telephone Encounter (Signed)
Message copied by Raiford Simmonds on Thu Jan 28, 2014  4:48 PM ------      Message from: Pixie Casino      Created: Thu Jan 28, 2014 12:32 PM       Her EF has normalized. Good news.            Dr. Lemmie Evens ------

## 2014-01-28 NOTE — Telephone Encounter (Signed)
Spoke to daughter. Result given . Verbalized understanding  

## 2014-02-11 ENCOUNTER — Other Ambulatory Visit (HOSPITAL_COMMUNITY): Payer: Self-pay

## 2014-02-11 DIAGNOSIS — D638 Anemia in other chronic diseases classified elsewhere: Secondary | ICD-10-CM

## 2014-02-11 DIAGNOSIS — C9110 Chronic lymphocytic leukemia of B-cell type not having achieved remission: Secondary | ICD-10-CM

## 2014-02-15 ENCOUNTER — Encounter (HOSPITAL_COMMUNITY): Payer: Medicare Other | Attending: Oncology

## 2014-02-15 ENCOUNTER — Encounter (HOSPITAL_COMMUNITY): Payer: Medicare Other | Attending: Hematology and Oncology

## 2014-02-15 ENCOUNTER — Encounter (HOSPITAL_COMMUNITY): Payer: Medicare Other

## 2014-02-15 ENCOUNTER — Encounter (HOSPITAL_COMMUNITY): Payer: Self-pay

## 2014-02-15 VITALS — BP 129/50 | HR 77 | Temp 98.7°F | Resp 18 | Wt 144.0 lb

## 2014-02-15 DIAGNOSIS — D631 Anemia in chronic kidney disease: Secondary | ICD-10-CM

## 2014-02-15 DIAGNOSIS — D638 Anemia in other chronic diseases classified elsewhere: Secondary | ICD-10-CM | POA: Diagnosis not present

## 2014-02-15 DIAGNOSIS — Z23 Encounter for immunization: Secondary | ICD-10-CM

## 2014-02-15 DIAGNOSIS — C9110 Chronic lymphocytic leukemia of B-cell type not having achieved remission: Secondary | ICD-10-CM | POA: Insufficient documentation

## 2014-02-15 DIAGNOSIS — N039 Chronic nephritic syndrome with unspecified morphologic changes: Secondary | ICD-10-CM

## 2014-02-15 DIAGNOSIS — N189 Chronic kidney disease, unspecified: Secondary | ICD-10-CM

## 2014-02-15 LAB — CBC
HEMATOCRIT: 34.2 % — AB (ref 36.0–46.0)
Hemoglobin: 11 g/dL — ABNORMAL LOW (ref 12.0–15.0)
MCH: 28.2 pg (ref 26.0–34.0)
MCHC: 32.2 g/dL (ref 30.0–36.0)
MCV: 87.7 fL (ref 78.0–100.0)
Platelets: 169 10*3/uL (ref 150–400)
RBC: 3.9 MIL/uL (ref 3.87–5.11)
RDW: 15.9 % — AB (ref 11.5–15.5)
WBC: 16 10*3/uL — ABNORMAL HIGH (ref 4.0–10.5)

## 2014-02-15 LAB — RETICULOCYTES
RBC.: 3.9 MIL/uL (ref 3.87–5.11)
Retic Count, Absolute: 19.5 10*3/uL (ref 19.0–186.0)
Retic Ct Pct: 0.5 % (ref 0.4–3.1)

## 2014-02-15 LAB — FERRITIN: Ferritin: 91 ng/mL (ref 10–291)

## 2014-02-15 MED ORDER — INFLUENZA VAC SPLIT QUAD 0.5 ML IM SUSY
0.5000 mL | PREFILLED_SYRINGE | Freq: Once | INTRAMUSCULAR | Status: AC
  Administered 2014-02-15: 0.5 mL via INTRAMUSCULAR
  Filled 2014-02-15: qty 0.5

## 2014-02-15 NOTE — Progress Notes (Signed)
Anne [name]  OFFICE PROGRESS NOTE  [place], [facility], MD  Anne [name] [id number]  DIAGNOSIS: CLL (chronic lymphocytic leukemia) - Plan: Reticulocytes  Anemia in chronic kidney disease  Anemia of chronic disease  Chief Complaint  Patient presents with  . Chronic lymphocytic leukemia  . Anemia in chronic kidney disease    CURRENT THERAPY: Surveillance per NCCN guidelines with intermittent Procrit to maintain hemoglobin as close to 11 as possible with last dose of 40,000 units given on 01/18/2014 when hemoglobin was 10.8. NCCN guidelines for indication for treatment of CLL:  A. Eligible for clinical trial  B. Significant disease-related symptoms  1. Fatigue (severe)  2. Night sweats  3. Weight loss  4. Fever without infection  C. Threatened end-organ function  D. Progressive bulky disease (spleen >6cm below costal margin, lymph nodes >10 cm)  E. Progressive anemia  F. Progressive thrombocytopenia  INTERVAL HISTORY: Anne [name] 80 y.o. female returns for followup of chronic lymphocytic leukemia with anemia secondary to chronic kidney disease for which intermittent Procrit therapy is given, last dose of 40,000 units given on 8/24//2015. She lives with her son. Appetite has been good with no nausea, vomiting, or easy satiety. She denies any fever, night sweats, lower extremity swelling or redness, severe fatigue, diarrhea, constipation, melena, hematochezia, hematuria, epistaxis, skin rash, worsening joint pain, headache, or seizures.    MEDICAL HISTORY: Past Medical History  Diagnosis Date  . CLL (chronic lymphocytic leukemia)     stable  . Anemia   . DM (diabetes mellitus)   . Hypercholesteremia   . DJD (degenerative joint disease) of knee     bilat  . Hypertension   . Shortness of breath   . CHF (congestive heart failure)     [place] Class 3 Heart Failure  . Blood transfusion   . CLL (chronic lymphocytic leukemia)   .  CLL (chronic lymphocytic leukemia) 04/03/2007    Stage 0 CLL    . Anemia of chronic disease 10/30/2011  . Ischemic cardiomyopathy     EF of 35% on 2D Echo done 04/07/12 moderate intraventricular dyssynchrony, moderate MR and moderate to severe TR with an RVSP of 31  . Coronary artery disease     three-vessel CAD with CABG -- x5 -- left internal mammary artery to LAD, sequential saphenous vein graft to first diagonal and first obtuse marginal, sequential saphenous vein graft to posterior descending and obtuse marginal 2 (posterolateral), endoscopic vein harvest of righ leg    INTERIM HISTORY: has CLL (chronic lymphocytic leukemia); HYPOTHYROIDISM; DIABETES MELLITUS, TYPE II, UNCONTROLLED; HYPERLIPIDEMIA; DEPRESSION; CATARACTS; HYPERTENSION; ARTHRITIS; LOW BACK PAIN, CHRONIC; BURSITIS, ACROMIOCLAVICULAR, LEFT; MEMORY LOSS; FECAL INCONTINENCE; Ischemic cardiomyopathy; Chronic systolic dysfunction of left ventricle; Anemia of chronic disease; Pacemaker-[employer]; Coronary artery disease; and S/P CABG x 5 on her problem list.    ALLERGIES:  is allergic to lisinopril.  MEDICATIONS: has a current medication list which includes the following prescription(s): aspirin, carvedilol, citalopram, diovan, docusate calcium, donepezil hcl, fish oil-omega-3 fatty acids, furosemide, hydrocodone-acetaminophen, insulin detemir, iron, lidocaine, lorazepam, metformin, multiple vitamins-minerals, namenda, pravastatin, tradjenta, and tramadol, and the following Facility-Administered Medications: influenza vac split quadrivalent pf.  SURGICAL HISTORY:  Past Surgical History  Procedure Laterality Date  . Coronary artery bypass graft  06/22/2009    x5 -- left internal mammary artery to LAD, sequential saphenous vein graft to first diagonal and first obtuse marginal, sequential saphenous vein graft to posterior descending  and obtuse marginal 2 (posterolateral), endoscopic vein harvest of righ leg  . Goiter resection    .  Thyroidectomy  1970s  . Cataract extraction, bilateral    . Rectal abscess    . Replacement total knee bilateral      bilateral  . Cardiac catheterization  01/18/2011    Est. EF of 25% to 30 - Mildly reduced cardiac output with no signs of decompensated heart failure and normal right heart filling pressures -- Patent bypass grafts with underlying three-vessel coronary artery disease  . Cardiac catheterization  06/14/2009    Est. EF of of 45% - Multivessel coronary artery disease with high-grade lesions in the LAD, left circumflex, and RCA -- Mildly reduced left ventricular function -- No significant aortic stenosis or mitral regurgitation  . Pacemaker insertion  04/16/2011    biventricular pacemaker insertion  . Eye surgery    . Steroid injections to sacral spine 01/28/13 - dr Brien Few  01/28/13  . Skin graft  09/2013    nose    FAMILY HISTORY: family history includes Heart attack (age of onset: 76) in her brother; Heart disease (age of onset: 32) in her son; Heart failure (age of onset: 44) in her brother; Hypertension in her son.  SOCIAL HISTORY:  reports that she has never smoked. She has never used smokeless tobacco. She reports that she does not drink alcohol or use illicit drugs.  REVIEW OF SYSTEMS:  Other than that discussed above is noncontributory.  PHYSICAL EXAMINATION: ECOG PERFORMANCE STATUS: 1 - Symptomatic but completely ambulatory  Blood pressure 129/50, pulse 77, temperature 98.7 F (37.1 C), temperature source Oral, resp. rate 18, weight 144 lb (65.318 kg), SpO2 98.00%.  GENERAL:alert, no distress and comfortable SKIN: skin color, texture, turgor are normal, no rashes or significant lesions. Low grade ecchymoses on both upper extremities. EYES: PERLA; Conjunctiva are pink and non-injected, sclera clear SINUSES: No redness or tenderness over maxillary or ethmoid sinuses OROPHARYNX:no exudate, no erythema on lips, buccal mucosa, or tongue. NECK: supple, thyroid normal size,  non-tender, without nodularity. No masses CHEST: Normal AP diameter with no breast masses. Pacemaker in place. Midline thyroid scar well healed. LYMPH:  no palpable lymphadenopathy in the cervical, axillary or inguinal LUNGS: clear to auscultation and percussion with normal breathing effort HEART: regular rate & rhythm and no murmurs. ABDOMEN:abdomen soft, non-tender and normal bowel sounds. Liver and spleen not enlarged. MUSCULOSKELETAL:no cyanosis of digits and no clubbing. Range of motion normal.  NEURO: alert & oriented x 3 with fluent speech, no focal motor/sensory deficits   LABORATORY DATA: Lab on 02/15/2014  Component Date Value Ref Range Status  . WBC 02/15/2014 16.0* 4.0 - 10.5 K/uL Final  . RBC 02/15/2014 3.90  3.87 - 5.11 MIL/uL Final  . Hemoglobin 02/15/2014 11.0* 12.0 - 15.0 g/dL Final  . HCT 02/15/2014 34.2* 36.0 - 46.0 % Final  . MCV 02/15/2014 87.7  78.0 - 100.0 fL Final  . MCH 02/15/2014 28.2  26.0 - 34.0 pg Final  . MCHC 02/15/2014 32.2  30.0 - 36.0 g/dL Final  . RDW 02/15/2014 15.9* 11.5 - 15.5 % Final  . Platelets 02/15/2014 169  150 - 400 K/uL Final  . Retic Ct Pct 02/15/2014 0.5  0.4 - 3.1 % Final  . RBC. 02/15/2014 3.90  3.87 - 5.11 MIL/uL Final  . Retic Count, Manual 02/15/2014 19.5  19.0 - 186.0 K/uL Final  Lab on 01/18/2014  Component Date Value Ref Range Status  . WBC 01/18/2014 16.8* 4.0 -  10.5 K/uL Final  . RBC 01/18/2014 3.87  3.87 - 5.11 MIL/uL Final  . Hemoglobin 01/18/2014 10.8* 12.0 - 15.0 g/dL Final  . HCT 01/18/2014 34.0* 36.0 - 46.0 % Final  . MCV 01/18/2014 87.9  78.0 - 100.0 fL Final  . MCH 01/18/2014 27.9  26.0 - 34.0 pg Final  . MCHC 01/18/2014 31.8  30.0 - 36.0 g/dL Final  . RDW 01/18/2014 16.3* 11.5 - 15.5 % Final  . Platelets 01/18/2014 176  150 - 400 K/uL Final    PATHOLOGY: No new pathology. Peripheral smear does show smudge cells with no premature forms.  Urinalysis    Component Value Date/Time   COLORURINE YELLOW 06/14/2009  0838   APPEARANCEUR CLOUDY* 06/14/2009 0838   LABSPEC 1.010 06/14/2009 0838   PHURINE 6.0 06/14/2009 0838   GLUCOSEU NEGATIVE 06/14/2009 0838   HGBUR NEGATIVE 06/14/2009 0838   BILIRUBINUR NEGATIVE 06/14/2009 0838   KETONESUR NEGATIVE 06/14/2009 0838   PROTEINUR NEGATIVE 06/14/2009 0838   UROBILINOGEN 0.2 06/14/2009 0838   NITRITE POSITIVE* 06/14/2009 0838   LEUKOCYTESUR LARGE* 06/14/2009 0838    RADIOGRAPHIC STUDIES: No results found.  ASSESSMENT:  1. Chronic lymphocytic leukemia, no need for intervention at this time.  2. Anemia secondary to chronic kidney disease +/- CLL.  3. Chronic renal disease, Grade 3B  4. Degenerative joint disease left knee, status post total knee replacement, symptomatic.  5. Diabetes mellitus, type II, non-insulin requiring, controlled.  6. Hypertension, controlled.  7. Coronary artery disease, status post CABG x5 for three-vessel disease, diastolic dysfunction controlled well with dual chamber pacemaker       PLAN:  #1. Procrit 40,000 units subcutaneously and every 2 months if hemoglobin is less than 11. Dose was held today. #2. Followup in 4 months with CBC and ferritin.   All questions were answered. The patient knows to call the clinic with any problems, questions or concerns. We can certainly see the patient much sooner if necessary.   I spent 25 minutes counseling the patient face to face. The total time spent in the appointment was 30 minutes.    Doroteo Bradford, MD 02/15/2014 11:10 AM  DISCLAIMER:  This note was dictated with voice recognition software.  Similar sounding words can inadvertently be transcribed inaccurately and may not be corrected upon review.

## 2014-02-15 NOTE — Progress Notes (Signed)
1020:  Anne [name] presented for [employer]. Labs per MD order drawn via Peripheral Line 23 gauge needle inserted in right upper forearm.  Good blood return present. Procedure without incident.  Needle removed intact. Patient tolerated procedure well.

## 2014-02-15 NOTE — Patient Instructions (Signed)
Anne [name] Discharge Instructions  RECOMMENDATIONS MADE BY THE CONSULTANT AND ANY TEST RESULTS WILL BE SENT TO YOUR REFERRING PHYSICIAN.  Return in two months for blood work and possible Procrit injection. Follow up in four months for office visit, blood work, and possible Procrit injection.   Thank you for choosing [facility] to provide your oncology and hematology care.  To afford each patient quality time with our providers, please arrive at least 15 minutes before your scheduled appointment time.  With your help, our goal is to use those 15 minutes to complete the necessary work-up to ensure our physicians have the information they need to help with your evaluation and healthcare recommendations.    Effective January 1st, 2014, we ask that you re-schedule your appointment with our physicians should you arrive 10 or more minutes late for your appointment.  We strive to give you quality time with our providers, and arriving late affects you and other patients whose appointments are after yours.    Again, thank you for choosing [facility].  Our hope is that these requests will decrease the amount of time that you wait before being seen by our physicians.       _____________________________________________________________  Should you have questions after your visit to [facility], please contact our office at [phone] between the hours of 8:30 a.m. and 4:30 p.m.  Voicemails left after 4:30 p.m. will not be returned until the following business day.  For prescription refill requests, have your pharmacy contact our office with your prescription refill request.    _______________________________________________________________  We hope that we have given you very good care.  You may receive a patient satisfaction survey in the mail, please complete it and return it as soon as possible.  We value your  feedback!  _______________________________________________________________  Have you asked about our STAR program?  STAR stands for Survivorship Training and Rehabilitation, and this is a nationally recognized cancer care program that focuses on survivorship and rehabilitation.  Cancer and cancer treatments may cause problems, such as, pain, making you feel tired and keeping you from doing the things that you need or want to do. Cancer rehabilitation can help. Our goal is to reduce these troubling effects and help you have the best quality of life possible.  You may receive a survey from a nurse that asks questions about your current state of health.  Based on the survey results, all eligible patients will be referred to the [facility] program for an evaluation so we can better serve you!  A frequently asked questions sheet is available upon request.

## 2014-02-15 NOTE — Progress Notes (Signed)
1100:  Hgb of 11 reported to Dr. Barnet Glasgow and per his orders we will hold Procrit injection today.

## 2014-02-15 NOTE — Progress Notes (Signed)
1105:  Anne [name] presents today for injection per the provider's orders.  Fluvarix administration without incident; see MAR for injection details.  Patient tolerated procedure well and without incident.  No questions or complaints noted at this time.

## 2014-02-20 ENCOUNTER — Other Ambulatory Visit: Payer: Self-pay | Admitting: Internal Medicine

## 2014-02-22 NOTE — Telephone Encounter (Signed)
Rx was sent to pharmacy electronically. 

## 2014-02-23 ENCOUNTER — Telehealth (HOSPITAL_COMMUNITY): Payer: Self-pay

## 2014-02-23 NOTE — Telephone Encounter (Signed)
Call from daughter and wants to know if Anne [name] can come in monthly for labs and possible procrit injections instead of waiting for 2 months.  Is concerned that her condition may deteriorate before the 2 months period and she (daughter) will be out of town and will not be able to monitor her condition as closely as she normally does.

## 2014-02-23 NOTE — Telephone Encounter (Signed)
Per Dr. Idamae Lusher orders patient can come monthly to check labs and possibly receive procrit injection.  Daughter notified and verbalized understanding of instructions.

## 2014-03-12 ENCOUNTER — Other Ambulatory Visit: Payer: Self-pay

## 2014-03-15 ENCOUNTER — Encounter (HOSPITAL_COMMUNITY): Payer: Medicare Other

## 2014-03-15 ENCOUNTER — Encounter (HOSPITAL_COMMUNITY): Payer: Medicare Other | Attending: Oncology

## 2014-03-15 VITALS — BP 123/72 | HR 64 | Temp 98.0°F | Resp 16

## 2014-03-15 DIAGNOSIS — D631 Anemia in chronic kidney disease: Secondary | ICD-10-CM

## 2014-03-15 DIAGNOSIS — D638 Anemia in other chronic diseases classified elsewhere: Secondary | ICD-10-CM | POA: Diagnosis present

## 2014-03-15 DIAGNOSIS — N189 Chronic kidney disease, unspecified: Secondary | ICD-10-CM

## 2014-03-15 DIAGNOSIS — C9110 Chronic lymphocytic leukemia of B-cell type not having achieved remission: Secondary | ICD-10-CM

## 2014-03-15 LAB — CBC
HCT: 30.6 % — ABNORMAL LOW (ref 36.0–46.0)
HEMOGLOBIN: 10.1 g/dL — AB (ref 12.0–15.0)
MCH: 29.1 pg (ref 26.0–34.0)
MCHC: 33 g/dL (ref 30.0–36.0)
MCV: 88.2 fL (ref 78.0–100.0)
Platelets: 164 10*3/uL (ref 150–400)
RBC: 3.47 MIL/uL — ABNORMAL LOW (ref 3.87–5.11)
RDW: 15.6 % — ABNORMAL HIGH (ref 11.5–15.5)
WBC: 16.3 10*3/uL — AB (ref 4.0–10.5)

## 2014-03-15 MED ORDER — EPOETIN ALFA 40000 UNIT/ML IJ SOLN
40000.0000 [IU] | Freq: Once | INTRAMUSCULAR | Status: AC
  Administered 2014-03-15: 40000 [IU] via SUBCUTANEOUS

## 2014-03-15 MED ORDER — EPOETIN ALFA 40000 UNIT/ML IJ SOLN
INTRAMUSCULAR | Status: AC
  Filled 2014-03-15: qty 1

## 2014-03-15 NOTE — Progress Notes (Signed)
LABS FOR CBC 

## 2014-03-15 NOTE — Patient Instructions (Signed)
[facility] Discharge Instructions  RECOMMENDATIONS MADE BY THE CONSULTANT AND ANY TEST RESULTS WILL BE SENT TO YOUR REFERRING PHYSICIAN.  INSTRUCTIONS/FOLLOW-UP: Hemoglobin 10.1 today. Procrit 40,000 units given. Continue lab work and injection every 4 weeks. Return to clinic as scheduled.   Thank you for choosing [facility] to provide your oncology and hematology care.  To afford each patient quality time with our providers, please arrive at least 15 minutes before your scheduled appointment time.  With your help, our goal is to use those 15 minutes to complete the necessary work-up to ensure our physicians have the information they need to help with your evaluation and healthcare recommendations.    Effective January 1st, 2014, we ask that you re-schedule your appointment with our physicians should you arrive 10 or more minutes late for your appointment.  We strive to give you quality time with our providers, and arriving late affects you and other patients whose appointments are after yours.    Again, thank you for choosing [facility].  Our hope is that these requests will decrease the amount of time that you wait before being seen by our physicians.       _____________________________________________________________  Should you have questions after your visit to [facility], please contact our office at [phone] between the hours of 8:30 a.m. and 4:30 p.m.  Voicemails left after 4:30 p.m. will not be returned until the following business day.  For prescription refill requests, have your pharmacy contact our office with your prescription refill request.    _______________________________________________________________  We hope that we have given you very good care.  You may receive a patient satisfaction survey in the mail, please complete it and return it as soon as possible.  We value your  feedback!  _______________________________________________________________  Have you asked about our STAR program?  STAR stands for Survivorship Training and Rehabilitation, and this is a nationally recognized cancer care program that focuses on survivorship and rehabilitation.  Cancer and cancer treatments may cause problems, such as, pain, making you feel tired and keeping you from doing the things that you need or want to do. Cancer rehabilitation can help. Our goal is to reduce these troubling effects and help you have the best quality of life possible.  You may receive a survey from a nurse that asks questions about your current state of health.  Based on the survey results, all eligible patients will be referred to the [facility] program for an evaluation so we can better serve you!  A frequently asked questions sheet is available upon request.

## 2014-03-15 NOTE — Progress Notes (Signed)
Anne [name] presents today for injection per MD orders. Procrit 40,000 units administered SQ in right Abdomen. Administration without incident. Patient tolerated well.

## 2014-04-15 ENCOUNTER — Encounter (HOSPITAL_COMMUNITY): Payer: Medicare Other

## 2014-04-15 ENCOUNTER — Encounter (HOSPITAL_COMMUNITY): Payer: Self-pay

## 2014-04-15 ENCOUNTER — Encounter (HOSPITAL_COMMUNITY): Payer: Medicare Other | Attending: Oncology

## 2014-04-15 DIAGNOSIS — C9110 Chronic lymphocytic leukemia of B-cell type not having achieved remission: Secondary | ICD-10-CM | POA: Diagnosis present

## 2014-04-15 DIAGNOSIS — D631 Anemia in chronic kidney disease: Secondary | ICD-10-CM

## 2014-04-15 DIAGNOSIS — N183 Chronic kidney disease, stage 3 (moderate): Secondary | ICD-10-CM

## 2014-04-15 DIAGNOSIS — D638 Anemia in other chronic diseases classified elsewhere: Secondary | ICD-10-CM | POA: Diagnosis present

## 2014-04-15 LAB — CBC
HCT: 32.6 % — ABNORMAL LOW (ref 36.0–46.0)
HEMOGLOBIN: 10.5 g/dL — AB (ref 12.0–15.0)
MCH: 29.5 pg (ref 26.0–34.0)
MCHC: 32.2 g/dL (ref 30.0–36.0)
MCV: 91.6 fL (ref 78.0–100.0)
Platelets: 198 10*3/uL (ref 150–400)
RBC: 3.56 MIL/uL — AB (ref 3.87–5.11)
RDW: 15.7 % — ABNORMAL HIGH (ref 11.5–15.5)
WBC: 17.8 10*3/uL — ABNORMAL HIGH (ref 4.0–10.5)

## 2014-04-15 MED ORDER — EPOETIN ALFA 40000 UNIT/ML IJ SOLN
INTRAMUSCULAR | Status: AC
  Filled 2014-04-15: qty 1

## 2014-04-15 MED ORDER — EPOETIN ALFA 40000 UNIT/ML IJ SOLN
40000.0000 [IU] | Freq: Once | INTRAMUSCULAR | Status: AC
  Administered 2014-04-15: 40000 [IU] via SUBCUTANEOUS

## 2014-04-15 NOTE — Progress Notes (Unsigned)
LABS FOR CBC DRAWN BY RN 

## 2014-04-15 NOTE — Progress Notes (Signed)
Anne [name]'s reason for visit today is for an injection and labs as scheduled per MD orders.  Labs were drawn prior to administration of ordered medication.  Venipuncture performed with a 23 gauge butterfly needle to [name] also received procrit 40,000 units per MD orders; see [facility] for administration details.  Anne [name] tolerated all procedures well and without incident; questions were answered and patient was discharged.

## 2014-04-15 NOTE — Patient Instructions (Signed)
You had a procrit 40,000 unit injection, please call the clinic if you have any questions or concerns

## 2014-04-16 ENCOUNTER — Ambulatory Visit (HOSPITAL_COMMUNITY): Payer: Medicare Other

## 2014-04-16 ENCOUNTER — Other Ambulatory Visit (HOSPITAL_COMMUNITY): Payer: Medicare Other

## 2014-05-05 ENCOUNTER — Encounter (HOSPITAL_COMMUNITY): Payer: Self-pay | Admitting: Internal Medicine

## 2014-05-17 ENCOUNTER — Encounter (HOSPITAL_COMMUNITY): Payer: Medicare Other | Attending: Oncology

## 2014-05-17 ENCOUNTER — Encounter: Payer: Self-pay | Admitting: Internal Medicine

## 2014-05-17 ENCOUNTER — Encounter (HOSPITAL_COMMUNITY): Payer: Self-pay

## 2014-05-17 ENCOUNTER — Encounter (HOSPITAL_BASED_OUTPATIENT_CLINIC_OR_DEPARTMENT_OTHER): Payer: Medicare Other

## 2014-05-17 ENCOUNTER — Ambulatory Visit (INDEPENDENT_AMBULATORY_CARE_PROVIDER_SITE_OTHER): Payer: Medicare Other | Admitting: Internal Medicine

## 2014-05-17 VITALS — BP 128/80 | HR 84 | Ht 64.0 in | Wt 149.6 lb

## 2014-05-17 DIAGNOSIS — D638 Anemia in other chronic diseases classified elsewhere: Secondary | ICD-10-CM

## 2014-05-17 DIAGNOSIS — N183 Chronic kidney disease, stage 3 (moderate): Secondary | ICD-10-CM

## 2014-05-17 DIAGNOSIS — C9110 Chronic lymphocytic leukemia of B-cell type not having achieved remission: Secondary | ICD-10-CM

## 2014-05-17 DIAGNOSIS — I255 Ischemic cardiomyopathy: Secondary | ICD-10-CM

## 2014-05-17 DIAGNOSIS — I519 Heart disease, unspecified: Secondary | ICD-10-CM

## 2014-05-17 DIAGNOSIS — D631 Anemia in chronic kidney disease: Secondary | ICD-10-CM

## 2014-05-17 LAB — MDC_IDC_ENUM_SESS_TYPE_INCLINIC
Battery Remaining Longevity: 88.8 mo
Battery Voltage: 2.93 V
Brady Statistic RA Percent Paced: 1 %
Brady Statistic RV Percent Paced: 99.52 %
Date Time Interrogation Session: 20151221113140
Implantable Pulse Generator Model: 3210
Implantable Pulse Generator Serial Number: 2707573
Lead Channel Impedance Value: 437.5 Ohm
Lead Channel Impedance Value: 600 Ohm
Lead Channel Impedance Value: 925 Ohm
Lead Channel Pacing Threshold Amplitude: 1 V
Lead Channel Pacing Threshold Amplitude: 1.125 V
Lead Channel Pacing Threshold Pulse Width: 0.4 ms
Lead Channel Sensing Intrinsic Amplitude: 11.3 mV
Lead Channel Sensing Intrinsic Amplitude: 2.3 mV
Lead Channel Setting Pacing Amplitude: 2.125
Lead Channel Setting Pacing Pulse Width: 0.4 ms
Lead Channel Setting Pacing Pulse Width: 0.4 ms
Lead Channel Setting Sensing Sensitivity: 2 mV
MDC IDC MSMT LEADCHNL LV PACING THRESHOLD PULSEWIDTH: 0.4 ms
MDC IDC MSMT LEADCHNL RV PACING THRESHOLD AMPLITUDE: 1 V
MDC IDC MSMT LEADCHNL RV PACING THRESHOLD PULSEWIDTH: 0.4 ms
MDC IDC SET LEADCHNL LV PACING AMPLITUDE: 2.125
MDC IDC SET LEADCHNL RA PACING AMPLITUDE: 2.125

## 2014-05-17 LAB — CBC
HEMATOCRIT: 33.3 % — AB (ref 36.0–46.0)
HEMOGLOBIN: 10.4 g/dL — AB (ref 12.0–15.0)
MCH: 29.6 pg (ref 26.0–34.0)
MCHC: 31.2 g/dL (ref 30.0–36.0)
MCV: 94.9 fL (ref 78.0–100.0)
Platelets: 290 10*3/uL (ref 150–400)
RBC: 3.51 MIL/uL — AB (ref 3.87–5.11)
RDW: 14.7 % (ref 11.5–15.5)
WBC: 18 10*3/uL — AB (ref 4.0–10.5)

## 2014-05-17 MED ORDER — EPOETIN ALFA 40000 UNIT/ML IJ SOLN
40000.0000 [IU] | Freq: Once | INTRAMUSCULAR | Status: AC
  Administered 2014-05-17: 40000 [IU] via SUBCUTANEOUS

## 2014-05-17 MED ORDER — EPOETIN ALFA 40000 UNIT/ML IJ SOLN
INTRAMUSCULAR | Status: AC
  Filled 2014-05-17: qty 1

## 2014-05-17 NOTE — Patient Instructions (Signed)
Your physician wants you to follow-up in: 12 months with Dr. Vallery Ridge will receive a reminder letter in the mail two months in advance. If you don't receive a letter, please call our office to schedule the follow-up appointment.   Remote monitoring is used to monitor your Pacemaker of ICD from home. This monitoring reduces the number of office visits required to check your device to one time per year. It allows [place] to keep an eye on the functioning of your device to ensure it is working properly. You are scheduled for a device check from home on 08/16/14. You may send your transmission at any time that day. If you have a wireless device, the transmission will be sent automatically. After your physician reviews your transmission, you will receive a postcard with your next transmission date.

## 2014-05-17 NOTE — Progress Notes (Signed)
[place] presents today for injection per the provider's orders.  Procrit administration without incident; see MAR for injection details.  Patient tolerated procedure well and without incident.  No questions or complaints noted at this time.

## 2014-05-17 NOTE — Progress Notes (Signed)
LABS FOR CBC 

## 2014-05-17 NOTE — Progress Notes (Signed)
PCP:  [name], MD Primary Cardiologist:  Dr Debara Pickett  The patient presents today for routine electrophysiology followup.  She is doing very well.   Her EF has normalized with CRT.  Today, she denies symptoms of palpitations, chest pain, shortness of breath, orthopnea, PND, lower extremity edema, dizziness, presyncope, syncope, or neurologic sequela.   Her primary concern is with cough which has been present for about 2 weeks.  She has had prior difficulty with hypoglycemia which is improving.  The patient feels that she is tolerating medications without difficulties and is otherwise without complaint today.   Past Medical History  Diagnosis Date  . CLL (chronic lymphocytic leukemia)     stable  . Anemia   . DM (diabetes mellitus)   . Hypercholesteremia   . DJD (degenerative joint disease) of knee     bilat  . Hypertension   . Shortness of breath   . CHF (congestive heart failure)     [place] Class 3 Heart Failure  . Blood transfusion   . CLL (chronic lymphocytic leukemia)   . CLL (chronic lymphocytic leukemia) 04/03/2007    Stage 0 CLL    . Anemia of chronic disease 10/30/2011  . Ischemic cardiomyopathy     EF of 35% on 2D Echo done 04/07/12 moderate intraventricular dyssynchrony, moderate MR and moderate to severe TR with an RVSP of 31  . Coronary artery disease     three-vessel CAD with CABG -- x5 -- left internal mammary artery to LAD, sequential saphenous vein graft to first diagonal and first obtuse marginal, sequential saphenous vein graft to posterior descending and obtuse marginal 2 (posterolateral), endoscopic vein harvest of righ leg   Past Surgical History  Procedure Laterality Date  . Coronary artery bypass graft  06/22/2009    x5 -- left internal mammary artery to LAD, sequential saphenous vein graft to first diagonal and first obtuse marginal, sequential saphenous vein graft to posterior descending and obtuse marginal 2 (posterolateral), endoscopic vein harvest of righ leg  .  Goiter resection    . Thyroidectomy  1970s  . Cataract extraction, bilateral    . Rectal abscess    . Replacement total knee bilateral      bilateral  . Cardiac catheterization  01/18/2011    Est. EF of 25% to 30 - Mildly reduced cardiac output with no signs of decompensated heart failure and normal right heart filling pressures -- Patent bypass grafts with underlying three-vessel coronary artery disease  . Cardiac catheterization  06/14/2009    Est. EF of of 45% - Multivessel coronary artery disease with high-grade lesions in the LAD, left circumflex, and RCA -- Mildly reduced left ventricular function -- No significant aortic stenosis or mitral regurgitation  . Pacemaker insertion  04/16/2011    biventricular pacemaker insertion  . Eye surgery    . Steroid injections to sacral spine 01/28/13 - dr Brien Few  01/28/13  . Skin graft  09/2013    nose  . Bi-ventricular pacemaker insertion N/A 04/16/2011    Procedure: BI-VENTRICULAR PACEMAKER INSERTION (CRT-P);  Surgeon: Thompson Grayer, MD;  Location: [facility] CATH LAB;  Service: Cardiovascular;  Laterality: N/A;    Current Outpatient Prescriptions  Medication Sig Dispense Refill  . aspirin 81 MG EC tablet Take 81 mg by mouth daily.     . benzonatate (TESSALON) 100 MG capsule Take 1 capsule by mouth 2 (two) times daily as needed. cough  0  . carvedilol (COREG) 6.25 MG tablet TAKE 1 TABLET BY MOUTH TWICE  DAILY WITH A MEAL 180 tablet 3  . citalopram (CELEXA) 10 MG tablet Take 10 mg by mouth 2 (two) times daily.     Marland Kitchen DIOVAN 160 MG tablet Take 160 mg by mouth daily. & 1/2 tab on M, W, F nights    . Docusate Calcium (STOOL SOFTENER PO) Take 2 capsules by mouth daily as needed (constipation).     . Donepezil HCl (ARICEPT PO) Take 5 mg by mouth 3 (three) times a week.     . fish oil-omega-3 fatty acids 1000 MG capsule Take 1 g by mouth daily.    . furosemide (LASIX) 40 MG tablet Take 20 mg by mouth daily.    Marland Kitchen HYDROcodone-acetaminophen (NORCO) 7.5-325 MG per  tablet Take 1 tablet by mouth every 8 (eight) hours as needed.     . Insulin Detemir (LEVEMIR [place]) Inject 5-15 Units into the skin 2 (two) times daily.     . IRON CR PO Take 1 tablet by mouth 3 (three) times a week.    Marland Kitchen LC-4 LIDOCAINE EX Apply 1 application topically 3 (three) times daily as needed (pain). To lt knee    . LORazepam (ATIVAN) 0.5 MG tablet Take 0.5 mg by mouth every 8 (eight) hours as needed. FOR NERVES    . metFORMIN (GLUCOPHAGE) 1000 MG tablet Take 1,000 mg by mouth 2 (two) times daily with a meal.     . Multiple Vitamins-Minerals (CENTRUM SILVER PO) Take 1 tablet by mouth daily.     Marland Kitchen NAMENDA 10 MG tablet Take 10 mg by mouth 2 (two) times daily.     . pravastatin (PRAVACHOL) 40 MG tablet Take 1 tablet (40 mg total) by mouth daily. 90 tablet 2  . TRADJENTA 5 MG TABS tablet Take 5 mg by mouth at bedtime.     . traMADol (ULTRAM) 50 MG tablet Take 50 mg by mouth at bedtime.      No current facility-administered medications for this visit.    Allergies  Allergen Reactions  . Lisinopril     REACTION: Dry Cough    History   Social History  . Marital Status: Married    Spouse Name: N/A    Number of Children: 3  . Years of Education: N/A   Occupational History  .     Social History Main Topics  . Smoking status: Never Smoker   . Smokeless tobacco: Never Used  . Alcohol Use: No  . Drug Use: No  . Sexual Activity: No   Other Topics Concern  . Not on file   Social History Narrative   [employer] in [place].  Systems analyst prior to retiring.    Physical Exam: Filed Vitals:   05/17/14 1029  BP: 128/80  Pulse: 84  Height: 5\' 4"  (1.626 m)  Weight: 149 lb 9.6 oz (67.858 kg)    GEN- The patient is elderly appearing, alert and oriented x 3 today.   Head- normocephalic, atraumatic Eyes-  Sclera clear, conjunctiva pink Ears- hearing intact Oropharynx- clear Neck- supple Lungs- Clear to ausculation bilaterally, normal work of breathing Chest- pacemaker  pocket is well healed Heart- Regular rate and rhythm  GI- soft, NT, ND, + BS Extremities- no clubbing, cyanosis, or edema  Pacemaker interrogation- reviewed in detail today,  See PACEART report  Assessment and Plan:  1. Chronic systolic dysfunction Doing well s/p CRT-P implant Echo 9/15 is reviewed and reveals normalization of her EF. Normal pacemaker function See Claudia Desanctis Art report No changes today  2. HTN Stable No change required today  3. Ischemic CM No ischemic symptoms   Merlin Return in 1 year

## 2014-05-17 NOTE — Patient Instructions (Signed)
[place] Discharge Instructions  RECOMMENDATIONS MADE BY THE CONSULTANT AND ANY TEST RESULTS WILL BE SENT TO YOUR REFERRING PHYSICIAN.  Today you received Procrit 40,000 units (hemoglobin 10.4) Return as scheduled for labs, injections, and office visit.   Thank you for choosing [facility] to provide your oncology and hematology care.  To afford each patient quality time with our providers, please arrive at least 15 minutes before your scheduled appointment time.  With your help, our goal is to use those 15 minutes to complete the necessary work-up to ensure our physicians have the information they need to help with your evaluation and healthcare recommendations.    Effective January 1st, 2014, we ask that you re-schedule your appointment with our physicians should you arrive 10 or more minutes late for your appointment.  We strive to give you quality time with our providers, and arriving late affects you and other patients whose appointments are after yours.    Again, thank you for choosing [facility].  Our hope is that these requests will decrease the amount of time that you wait before being seen by our physicians.       _____________________________________________________________  Should you have questions after your visit to [facility], please contact our office at [phone] between the hours of 8:30 a.m. and 4:30 p.m.  Voicemails left after 4:30 p.m. will not be returned until the following business day.  For prescription refill requests, have your pharmacy contact our office with your prescription refill request.    _______________________________________________________________  We hope that we have given you very good care.  You may receive a patient satisfaction survey in the mail, please complete it and return it as soon as possible.  We value your  feedback!  _______________________________________________________________  Have you asked about our STAR program?  STAR stands for Survivorship Training and Rehabilitation, and this is a nationally recognized cancer care program that focuses on survivorship and rehabilitation.  Cancer and cancer treatments may cause problems, such as, pain, making you feel tired and keeping you from doing the things that you need or want to do. Cancer rehabilitation can help. Our goal is to reduce these troubling effects and help you have the best quality of life possible.  You may receive a survey from a nurse that asks questions about your current state of health.  Based on the survey results, all eligible patients will be referred to the [facility] program for an evaluation so we can better serve you!  A frequently asked questions sheet is available upon request.

## 2014-06-16 ENCOUNTER — Other Ambulatory Visit (HOSPITAL_COMMUNITY): Payer: Medicare Other

## 2014-06-16 ENCOUNTER — Ambulatory Visit (HOSPITAL_COMMUNITY): Payer: Medicare Other

## 2014-06-16 ENCOUNTER — Encounter (HOSPITAL_COMMUNITY): Payer: Medicare Other

## 2014-06-16 ENCOUNTER — Encounter (HOSPITAL_COMMUNITY): Payer: Medicare Other | Attending: Oncology | Admitting: Hematology & Oncology

## 2014-06-16 ENCOUNTER — Ambulatory Visit (HOSPITAL_COMMUNITY): Payer: Medicare Other | Admitting: Hematology & Oncology

## 2014-06-16 DIAGNOSIS — D631 Anemia in chronic kidney disease: Secondary | ICD-10-CM

## 2014-06-16 DIAGNOSIS — N183 Chronic kidney disease, stage 3 (moderate): Secondary | ICD-10-CM

## 2014-06-16 DIAGNOSIS — C9110 Chronic lymphocytic leukemia of B-cell type not having achieved remission: Secondary | ICD-10-CM | POA: Insufficient documentation

## 2014-06-16 DIAGNOSIS — D638 Anemia in other chronic diseases classified elsewhere: Secondary | ICD-10-CM

## 2014-06-16 DIAGNOSIS — N189 Chronic kidney disease, unspecified: Secondary | ICD-10-CM

## 2014-06-16 LAB — CBC
HCT: 33.4 % — ABNORMAL LOW (ref 36.0–46.0)
Hemoglobin: 10.7 g/dL — ABNORMAL LOW (ref 12.0–15.0)
MCH: 30.1 pg (ref 26.0–34.0)
MCHC: 32 g/dL (ref 30.0–36.0)
MCV: 94.1 fL (ref 78.0–100.0)
PLATELETS: 159 10*3/uL (ref 150–400)
RBC: 3.55 MIL/uL — AB (ref 3.87–5.11)
RDW: 14.4 % (ref 11.5–15.5)
WBC: 17.8 10*3/uL — ABNORMAL HIGH (ref 4.0–10.5)

## 2014-06-16 MED ORDER — EPOETIN ALFA 40000 UNIT/ML IJ SOLN
INTRAMUSCULAR | Status: AC
  Filled 2014-06-16: qty 1

## 2014-06-16 MED ORDER — EPOETIN ALFA 40000 UNIT/ML IJ SOLN
40000.0000 [IU] | Freq: Once | INTRAMUSCULAR | Status: AC
  Administered 2014-06-16: 40000 [IU] via SUBCUTANEOUS

## 2014-06-16 NOTE — Progress Notes (Signed)
Anne [name] presents today for injection per MD orders. Procrit 40,000 units administered SQ in left Abdomen. Administration without incident. Patient tolerated well.

## 2014-06-16 NOTE — Patient Instructions (Signed)
[place] at [facility]  Discharge Instructions: Please call with any problems or concerns prior to follow-up We will see you back in 3 months with labs _______________________________________________________________  Thank you for choosing [place] at [facility] to provide your oncology and hematology care.  To afford each patient quality time with our providers, please arrive at least 15 minutes before your scheduled appointment.  You need to re-schedule your appointment if you arrive 10 or more minutes late.  We strive to give you quality time with our providers, and arriving late affects you and other patients whose appointments are after yours.  Also, if you no show three or more times for appointments you may be dismissed from the clinic.  Again, thank you for choosing [place] at [place] hope is that these requests will allow you access to exceptional care and in a timely manner. _______________________________________________________________  If you have questions after your visit, please contact our office at [phone] between the hours of 8:30 a.m. and 5:00 p.m. Voicemails left after 4:30 p.m. will not be returned until the following business day. _______________________________________________________________  For prescription refill requests, have your pharmacy contact our office. _______________________________________________________________  Recommendations made by the consultant and any test results will be sent to your referring physician. _______________________________________________________________

## 2014-06-16 NOTE — Progress Notes (Signed)
Anne Cahill, MD  [address]  [place] [id number]  CLL, negative ZAP-70 Anemia in CKD. Stage IIIB   CURRENT THERAPY: Procrit since 12/2010, once weekly  INTERVAL HISTORY: Anne [name] 81 y.o. female returns for follow-up of her CLL.She has no problems getting her for her procrit. She enjoys reading mysteries. Her daughter brought her today. She eats well, she has gained 7 lbs. Her daughter states back in September they tried to move her labs out to every other month but they decided against that. She has been stable for some time.   MEDICAL HISTORY: Past Medical History  Diagnosis Date  . CLL (chronic lymphocytic leukemia)     stable  . Anemia   . DM (diabetes mellitus)   . Hypercholesteremia   . DJD (degenerative joint disease) of knee     bilat  . Hypertension   . Shortness of breath   . CHF (congestive heart failure)     [place] Class 3 Heart Failure  . Blood transfusion   . CLL (chronic lymphocytic leukemia)   . CLL (chronic lymphocytic leukemia) 04/03/2007    Stage 0 CLL    . Anemia of chronic disease 10/30/2011  . Ischemic cardiomyopathy     EF of 35% on 2D Echo done 04/07/12 moderate intraventricular dyssynchrony, moderate MR and moderate to severe TR with an RVSP of 31  . Coronary artery disease     three-vessel CAD with CABG -- x5 -- left internal mammary artery to LAD, sequential saphenous vein graft to first diagonal and first obtuse marginal, sequential saphenous vein graft to posterior descending and obtuse marginal 2 (posterolateral), endoscopic vein harvest of righ leg    has CLL (chronic lymphocytic leukemia); HYPOTHYROIDISM; DIABETES MELLITUS, TYPE II, UNCONTROLLED; HYPERLIPIDEMIA; DEPRESSION; CATARACTS; HYPERTENSION; ARTHRITIS; LOW BACK PAIN, CHRONIC; BURSITIS, ACROMIOCLAVICULAR, LEFT; MEMORY LOSS; FECAL INCONTINENCE; Ischemic cardiomyopathy; Chronic systolic dysfunction of left ventricle; Anemia of chronic disease; Pacemaker-St.Jude; Coronary artery  disease; and S/P CABG x 5 on her problem list.     No history exists.     is allergic to lisinopril.  We administered epoetin alfa.  SURGICAL HISTORY: Past Surgical History  Procedure Laterality Date  . Coronary artery bypass graft  06/22/2009    x5 -- left internal mammary artery to LAD, sequential saphenous vein graft to first diagonal and first obtuse marginal, sequential saphenous vein graft to posterior descending and obtuse marginal 2 (posterolateral), endoscopic vein harvest of righ leg  . Goiter resection    . Thyroidectomy  1970s  . Cataract extraction, bilateral    . Rectal abscess    . Replacement total knee bilateral      bilateral  . Cardiac catheterization  01/18/2011    Est. EF of 25% to 30 - Mildly reduced cardiac output with no signs of decompensated heart failure and normal right heart filling pressures -- Patent bypass grafts with underlying three-vessel coronary artery disease  . Cardiac catheterization  06/14/2009    Est. EF of of 45% - Multivessel coronary artery disease with high-grade lesions in the LAD, left circumflex, and RCA -- Mildly reduced left ventricular function -- No significant aortic stenosis or mitral regurgitation  . Pacemaker insertion  04/16/2011    biventricular pacemaker insertion  . Eye surgery    . Steroid injections to sacral spine 01/28/13 - dr Brien Few  01/28/13  . Skin graft  09/2013    nose  . Bi-ventricular pacemaker insertion N/A 04/16/2011    Procedure: BI-VENTRICULAR  PACEMAKER INSERTION (CRT-P);  Surgeon: Thompson Grayer, MD;  Location: [facility] CATH LAB;  Service: Cardiovascular;  Laterality: N/A;    SOCIAL HISTORY: History   Social History  . Marital Status: Married    Spouse Name: N/A    Number of Children: 3  . Years of Education: N/A   Occupational History  .     Social History Main Topics  . Smoking status: Never Smoker   . Smokeless tobacco: Never Used  . Alcohol Use: No  . Drug Use: No  . Sexual Activity: No   Other  Topics Concern  . Not on file   Social History Narrative   [employer] in [place].  Systems analyst prior to retiring.    FAMILY HISTORY: Family History  Problem Relation Age of Onset  . Heart failure Brother 82  . Heart attack Brother 23  . Heart disease Son 57    Had CABG x 1  . Hypertension Son     Review of Systems  Constitutional: Negative.   HENT: Negative.   Eyes: Negative.        Wears glasses  Respiratory: Negative.   Cardiovascular: Negative.   Gastrointestinal: Negative.   Genitourinary: Negative.        Nocturia  Musculoskeletal: Positive for back pain and joint pain. Negative for myalgias and neck pain.  Skin: Negative.   Neurological: Negative.   Endo/Heme/Allergies: Negative.   Psychiatric/Behavioral: Negative.     PHYSICAL EXAMINATION  ECOG PERFORMANCE STATUS: 0 - Asymptomatic  Filed Vitals:   06/16/14 1300  BP: 132/59  Pulse: 69  Temp: 98 F (36.7 C)  Resp: 18    Physical Exam  Constitutional: She is oriented to person, place, and time and well-developed, well-nourished, and in no distress.  Gets onto the exam table with assistance  HENT:  Head: Normocephalic and atraumatic.  Nose: Nose normal.  Mouth/Throat: Oropharynx is clear and moist. No oropharyngeal exudate.  Eyes: Conjunctivae and EOM are normal. Pupils are equal, round, and reactive to light. Right eye exhibits no discharge. Left eye exhibits no discharge. No scleral icterus.  Neck: Normal range of motion. Neck supple. No tracheal deviation present. No thyromegaly present.  Cardiovascular: Normal rate, regular rhythm and normal heart sounds.  Exam reveals no gallop and no friction rub.   No murmur heard. Pulmonary/Chest: Effort normal and breath sounds normal. She has no wheezes. She has no rales.  Abdominal: Soft. Bowel sounds are normal. She exhibits no distension and no mass. There is no tenderness. There is no rebound and no guarding.  Musculoskeletal: Normal range of motion.  She exhibits no edema.  Lymphadenopathy:    She has no cervical adenopathy.  Neurological: She is alert and oriented to person, place, and time. She has normal reflexes. No cranial nerve deficit. Gait normal. Coordination normal.  Skin: Skin is warm and dry. No rash noted.  Psychiatric: Mood, memory, affect and judgment normal.  Nursing note and vitals reviewed.   LABORATORY DATA:  CBC    Component Value Date/Time   WBC 17.8* 06/16/2014 1245   RBC 3.55* 06/16/2014 1245   RBC 3.90 02/15/2014 1000   HGB 10.7* 06/16/2014 1245   HCT 33.4* 06/16/2014 1245   PLT 159 06/16/2014 1245   MCV 94.1 06/16/2014 1245   MCH 30.1 06/16/2014 1245   MCHC 32.0 06/16/2014 1245   RDW 14.4 06/16/2014 1245   LYMPHSABS 10.8* 11/23/2013 0957   MONOABS 0.4 11/23/2013 0957   EOSABS 0.1 11/23/2013 0957   BASOSABS  0.0 11/23/2013 0957   CMP     Component Value Date/Time   NA 138 11/23/2013 0957   K 5.5* 11/23/2013 0957   CL 100 11/23/2013 0957   CO2 28 11/23/2013 0957   GLUCOSE 226* 11/23/2013 0957   BUN 29* 11/23/2013 0957   CREATININE 1.08 11/23/2013 0957   CREATININE 1.07 04/10/2011 1027   CALCIUM 9.1 11/23/2013 0957   PROT 6.6 11/23/2013 0957   ALBUMIN 3.3* 11/23/2013 0957   AST 27 11/23/2013 0957   ALT 16 11/23/2013 0957   ALKPHOS 101 11/23/2013 0957   BILITOT 0.3 11/23/2013 0957   GFRNONAA 46* 11/23/2013 0957   GFRAA 53* 11/23/2013 0957      ASSESSMENT and THERAPY PLAN:    CLL (chronic lymphocytic leukemia) 81 year old female with long-standing CLL, and anemia. She had a bone marrow biopsy many years back and we will have to pull the paper chart for evaluation of that. She has had a long-standing anemia that is felt to be secondary to her chronic kidney disease. She is on Procrit 40,000 units monthly. Her daughter states they do not want to discontinue that, as her blood counts have been stable for some time. I discussed additional evaluation of her anemia but at this point they are  not interested in proceeding. We will therefore continue with the plan as scheduled. I will see her back again in several months, sooner if needed.     All questions were answered. The patient knows to call the clinic with any problems, questions or concerns. We can certainly see the patient much sooner if necessary.  Anne [name], Anne [name] 06/21/2014

## 2014-06-21 ENCOUNTER — Encounter (HOSPITAL_COMMUNITY): Payer: Self-pay | Admitting: Hematology & Oncology

## 2014-06-21 NOTE — Assessment & Plan Note (Signed)
81 year old female with long-standing CLL, and anemia. She had a bone marrow biopsy many years back and we will have to pull the paper chart for evaluation of that. She has had a long-standing anemia that is felt to be secondary to her chronic kidney disease. She is on Procrit 40,000 units monthly. Her daughter states they do not want to discontinue that, as her blood counts have been stable for some time. I discussed additional evaluation of her anemia but at this point they are not interested in proceeding. We will therefore continue with the plan as scheduled. I will see her back again in several months, sooner if needed.

## 2014-07-14 ENCOUNTER — Encounter (HOSPITAL_COMMUNITY): Payer: Medicare Other | Attending: Oncology

## 2014-07-14 ENCOUNTER — Encounter (HOSPITAL_COMMUNITY): Payer: Medicare Other

## 2014-07-14 DIAGNOSIS — D638 Anemia in other chronic diseases classified elsewhere: Secondary | ICD-10-CM | POA: Insufficient documentation

## 2014-07-14 DIAGNOSIS — C9110 Chronic lymphocytic leukemia of B-cell type not having achieved remission: Secondary | ICD-10-CM | POA: Diagnosis present

## 2014-07-14 LAB — CBC
HCT: 35.9 % — ABNORMAL LOW (ref 36.0–46.0)
Hemoglobin: 11.2 g/dL — ABNORMAL LOW (ref 12.0–15.0)
MCH: 28.9 pg (ref 26.0–34.0)
MCHC: 31.2 g/dL (ref 30.0–36.0)
MCV: 92.8 fL (ref 78.0–100.0)
PLATELETS: 190 10*3/uL (ref 150–400)
RBC: 3.87 MIL/uL (ref 3.87–5.11)
RDW: 14.8 % (ref 11.5–15.5)
WBC: 16.3 10*3/uL — ABNORMAL HIGH (ref 4.0–10.5)

## 2014-07-14 NOTE — Progress Notes (Signed)
Lab visit

## 2014-07-14 NOTE — Progress Notes (Signed)
Procrit held due to hemoglobin 11.2

## 2014-08-06 ENCOUNTER — Other Ambulatory Visit (HOSPITAL_COMMUNITY): Payer: Self-pay

## 2014-08-11 ENCOUNTER — Encounter (HOSPITAL_BASED_OUTPATIENT_CLINIC_OR_DEPARTMENT_OTHER): Payer: Medicare Other

## 2014-08-11 ENCOUNTER — Encounter (HOSPITAL_COMMUNITY): Payer: Self-pay

## 2014-08-11 ENCOUNTER — Encounter (HOSPITAL_COMMUNITY): Payer: Medicare Other | Attending: Oncology

## 2014-08-11 DIAGNOSIS — D638 Anemia in other chronic diseases classified elsewhere: Secondary | ICD-10-CM

## 2014-08-11 DIAGNOSIS — C9110 Chronic lymphocytic leukemia of B-cell type not having achieved remission: Secondary | ICD-10-CM | POA: Insufficient documentation

## 2014-08-11 DIAGNOSIS — D631 Anemia in chronic kidney disease: Secondary | ICD-10-CM

## 2014-08-11 DIAGNOSIS — N189 Chronic kidney disease, unspecified: Secondary | ICD-10-CM

## 2014-08-11 LAB — CBC
HEMATOCRIT: 31.8 % — AB (ref 36.0–46.0)
HEMOGLOBIN: 10.2 g/dL — AB (ref 12.0–15.0)
MCH: 29.2 pg (ref 26.0–34.0)
MCHC: 32.1 g/dL (ref 30.0–36.0)
MCV: 91.1 fL (ref 78.0–100.0)
Platelets: 191 10*3/uL (ref 150–400)
RBC: 3.49 MIL/uL — ABNORMAL LOW (ref 3.87–5.11)
RDW: 15.2 % (ref 11.5–15.5)
WBC: 18.9 10*3/uL — AB (ref 4.0–10.5)

## 2014-08-11 MED ORDER — EPOETIN ALFA 40000 UNIT/ML IJ SOLN
40000.0000 [IU] | Freq: Once | INTRAMUSCULAR | Status: AC
  Administered 2014-08-11: 40000 [IU] via SUBCUTANEOUS

## 2014-08-11 MED ORDER — EPOETIN ALFA 40000 UNIT/ML IJ SOLN
INTRAMUSCULAR | Status: AC
  Filled 2014-08-11: qty 1

## 2014-08-11 NOTE — Patient Instructions (Signed)
[place] at [facility]  Discharge Instructions:  You had procrit 40,000 units today.  Please follow up as scheduled.  Please call the clinic if you have any questions or concerns _______________________________________________________________  Thank you for choosing [place] at [facility] to provide your oncology and hematology care.  To afford each patient quality time with our providers, please arrive at least 15 minutes before your scheduled appointment.  You need to re-schedule your appointment if you arrive 10 or more minutes late.  We strive to give you quality time with our providers, and arriving late affects you and other patients whose appointments are after yours.  Also, if you no show three or more times for appointments you may be dismissed from the clinic.  Again, thank you for choosing [place] at [place] hope is that these requests will allow you access to exceptional care and in a timely manner. _______________________________________________________________  If you have questions after your visit, please contact our office at [phone] between the hours of 8:30 a.m. and 5:00 p.m. Voicemails left after 4:30 p.m. will not be returned until the following business day. _______________________________________________________________  For prescription refill requests, have your pharmacy contact our office. _______________________________________________________________  Recommendations made by the consultant and any test results will be sent to your referring physician. _______________________________________________________________

## 2014-08-11 NOTE — Progress Notes (Signed)
[name]'s reason for visit today is for an injection and labs as scheduled per MD orders.  Labs were drawn prior to administration of ordered medication.    [name] also received procrit 40,000 units per MD orders; see [facility] for administration details.  Anne [name] tolerated all procedures well and without incident; questions were answered and patient was discharged.

## 2014-08-11 NOTE — Progress Notes (Signed)
Labs drawn

## 2014-08-16 ENCOUNTER — Encounter: Payer: Self-pay | Admitting: Internal Medicine

## 2014-08-16 ENCOUNTER — Ambulatory Visit (INDEPENDENT_AMBULATORY_CARE_PROVIDER_SITE_OTHER): Payer: Medicare Other | Admitting: *Deleted

## 2014-08-16 DIAGNOSIS — I255 Ischemic cardiomyopathy: Secondary | ICD-10-CM

## 2014-08-17 NOTE — Progress Notes (Signed)
Remote pacemaker transmission.   

## 2014-08-20 ENCOUNTER — Emergency Department (HOSPITAL_COMMUNITY): Payer: Medicare Other

## 2014-08-20 ENCOUNTER — Encounter (HOSPITAL_COMMUNITY): Payer: Self-pay | Admitting: *Deleted

## 2014-08-20 ENCOUNTER — Emergency Department (HOSPITAL_COMMUNITY)
Admission: EM | Admit: 2014-08-20 | Discharge: 2014-08-20 | Disposition: A | Discharge status: Home / Self Care | Payer: Medicare Other | Attending: Emergency Medicine | Admitting: Emergency Medicine

## 2014-08-20 DIAGNOSIS — I509 Heart failure, unspecified: Secondary | ICD-10-CM | POA: Insufficient documentation

## 2014-08-20 DIAGNOSIS — F0390 Unspecified dementia without behavioral disturbance: Secondary | ICD-10-CM | POA: Diagnosis not present

## 2014-08-20 DIAGNOSIS — E780 Pure hypercholesterolemia: Secondary | ICD-10-CM | POA: Diagnosis not present

## 2014-08-20 DIAGNOSIS — Z79899 Other long term (current) drug therapy: Secondary | ICD-10-CM | POA: Diagnosis not present

## 2014-08-20 DIAGNOSIS — R1110 Vomiting, unspecified: Secondary | ICD-10-CM

## 2014-08-20 DIAGNOSIS — N390 Urinary tract infection, site not specified: Secondary | ICD-10-CM | POA: Diagnosis not present

## 2014-08-20 DIAGNOSIS — I10 Essential (primary) hypertension: Secondary | ICD-10-CM | POA: Insufficient documentation

## 2014-08-20 DIAGNOSIS — D649 Anemia, unspecified: Secondary | ICD-10-CM | POA: Insufficient documentation

## 2014-08-20 DIAGNOSIS — Z9889 Other specified postprocedural states: Secondary | ICD-10-CM | POA: Diagnosis not present

## 2014-08-20 DIAGNOSIS — Z794 Long term (current) use of insulin: Secondary | ICD-10-CM | POA: Insufficient documentation

## 2014-08-20 DIAGNOSIS — Z856 Personal history of leukemia: Secondary | ICD-10-CM | POA: Diagnosis not present

## 2014-08-20 DIAGNOSIS — Z7982 Long term (current) use of aspirin: Secondary | ICD-10-CM | POA: Insufficient documentation

## 2014-08-20 DIAGNOSIS — E119 Type 2 diabetes mellitus without complications: Secondary | ICD-10-CM | POA: Diagnosis not present

## 2014-08-20 DIAGNOSIS — I2510 Atherosclerotic heart disease of native coronary artery without angina pectoris: Secondary | ICD-10-CM | POA: Diagnosis not present

## 2014-08-20 DIAGNOSIS — Z951 Presence of aortocoronary bypass graft: Secondary | ICD-10-CM | POA: Insufficient documentation

## 2014-08-20 LAB — MDC_IDC_ENUM_SESS_TYPE_REMOTE
Battery Remaining Longevity: 71 mo
Battery Voltage: 2.93 V
Brady Statistic AP VS Percent: 1 %
Brady Statistic AS VP Percent: 99 %
Brady Statistic AS VS Percent: 1 %
Brady Statistic RA Percent Paced: 1 %
Implantable Pulse Generator Model: 3210
Implantable Pulse Generator Serial Number: 2707573
Lead Channel Impedance Value: 400 Ohm
Lead Channel Pacing Threshold Amplitude: 1 V
Lead Channel Pacing Threshold Pulse Width: 0.4 ms
Lead Channel Sensing Intrinsic Amplitude: 12 mV
Lead Channel Setting Pacing Amplitude: 2 V
Lead Channel Setting Pacing Amplitude: 2 V
Lead Channel Setting Pacing Amplitude: 2 V
Lead Channel Setting Pacing Pulse Width: 0.4 ms
Lead Channel Setting Pacing Pulse Width: 0.4 ms
Lead Channel Setting Sensing Sensitivity: 2 mV
MDC IDC MSMT BATTERY REMAINING PERCENTAGE: 67 %
MDC IDC MSMT LEADCHNL LV IMPEDANCE VALUE: 910 Ohm
MDC IDC MSMT LEADCHNL LV PACING THRESHOLD AMPLITUDE: 0.875 V
MDC IDC MSMT LEADCHNL RA PACING THRESHOLD AMPLITUDE: 1 V
MDC IDC MSMT LEADCHNL RA PACING THRESHOLD PULSEWIDTH: 0.4 ms
MDC IDC MSMT LEADCHNL RA SENSING INTR AMPL: 2.3 mV
MDC IDC MSMT LEADCHNL RV IMPEDANCE VALUE: 510 Ohm
MDC IDC MSMT LEADCHNL RV PACING THRESHOLD PULSEWIDTH: 0.4 ms
MDC IDC SESS DTM: 20160321191902
MDC IDC STAT BRADY AP VP PERCENT: 1 %

## 2014-08-20 LAB — URINALYSIS, ROUTINE W REFLEX MICROSCOPIC
Bilirubin Urine: NEGATIVE
Glucose, UA: NEGATIVE mg/dL
Hgb urine dipstick: NEGATIVE
Ketones, ur: NEGATIVE mg/dL
NITRITE: NEGATIVE
PROTEIN: NEGATIVE mg/dL
Specific Gravity, Urine: 1.01 (ref 1.005–1.030)
Urobilinogen, UA: 0.2 mg/dL (ref 0.0–1.0)
pH: 6 (ref 5.0–8.0)

## 2014-08-20 LAB — CBC WITH DIFFERENTIAL/PLATELET
Basophils Absolute: 0 10*3/uL (ref 0.0–0.1)
Basophils Relative: 0 % (ref 0–1)
Eosinophils Absolute: 0.1 10*3/uL (ref 0.0–0.7)
Eosinophils Relative: 0 % (ref 0–5)
HCT: 33.5 % — ABNORMAL LOW (ref 36.0–46.0)
Hemoglobin: 10.6 g/dL — ABNORMAL LOW (ref 12.0–15.0)
LYMPHS PCT: 81 % — AB (ref 12–46)
Lymphs Abs: 14.4 10*3/uL — ABNORMAL HIGH (ref 0.7–4.0)
MCH: 29.5 pg (ref 26.0–34.0)
MCHC: 31.6 g/dL (ref 30.0–36.0)
MCV: 93.3 fL (ref 78.0–100.0)
Monocytes Absolute: 0.3 10*3/uL (ref 0.1–1.0)
Monocytes Relative: 2 % — ABNORMAL LOW (ref 3–12)
NEUTROS PCT: 17 % — AB (ref 43–77)
Neutro Abs: 3.1 10*3/uL (ref 1.7–7.7)
Platelets: 239 10*3/uL (ref 150–400)
RBC: 3.59 MIL/uL — AB (ref 3.87–5.11)
RDW: 17.5 % — AB (ref 11.5–15.5)
WBC: 17.9 10*3/uL — AB (ref 4.0–10.5)

## 2014-08-20 LAB — LIPASE, BLOOD: LIPASE: 39 U/L (ref 11–59)

## 2014-08-20 LAB — COMPREHENSIVE METABOLIC PANEL
ALT: 10 U/L (ref 0–35)
ANION GAP: 10 (ref 5–15)
AST: 22 U/L (ref 0–37)
Albumin: 4 g/dL (ref 3.5–5.2)
Alkaline Phosphatase: 57 U/L (ref 39–117)
BUN: 33 mg/dL — AB (ref 6–23)
CALCIUM: 9.4 mg/dL (ref 8.4–10.5)
CO2: 23 mmol/L (ref 19–32)
CREATININE: 1.36 mg/dL — AB (ref 0.50–1.10)
Chloride: 104 mmol/L (ref 96–112)
GFR, EST AFRICAN AMERICAN: 40 mL/min — AB (ref >90)
GFR, EST NON AFRICAN AMERICAN: 35 mL/min — AB (ref >90)
Glucose, Bld: 73 mg/dL (ref 70–99)
Potassium: 4.7 mmol/L (ref 3.5–5.1)
Sodium: 137 mmol/L (ref 135–145)
Total Bilirubin: 0.5 mg/dL (ref 0.3–1.2)
Total Protein: 6.9 g/dL (ref 6.0–8.3)

## 2014-08-20 LAB — URINE MICROSCOPIC-ADD ON

## 2014-08-20 LAB — TROPONIN I: Troponin I: <0.03 ng/mL (ref <0.031)

## 2014-08-20 MED ORDER — CIPROFLOXACIN HCL 500 MG PO TABS: 500.0000 mg | ORAL_TABLET | Freq: Two times a day (BID) | ORAL | Status: DC

## 2014-08-20 MED ORDER — IOHEXOL 300 MG/ML  SOLN
50.0000 mL | Freq: Once | INTRAMUSCULAR | Status: AC | PRN
  Administered 2014-08-20: 50 mL via ORAL

## 2014-08-20 MED ORDER — SODIUM CHLORIDE 0.9 % IV BOLUS (SEPSIS)
500.0000 mL | Freq: Once | INTRAVENOUS | Status: AC
  Administered 2014-08-20: 500 mL via INTRAVENOUS

## 2014-08-20 MED ORDER — ONDANSETRON 4 MG PO TBDP: ORAL_TABLET | ORAL | Status: AC

## 2014-08-20 MED ORDER — IOHEXOL 300 MG/ML  SOLN
80.0000 mL | Freq: Once | INTRAMUSCULAR | Status: AC | PRN
  Administered 2014-08-20: 80 mL via INTRAVENOUS

## 2014-08-20 MED ORDER — DEXTROSE 5 % IV SOLN
1.0000 g | Freq: Once | INTRAVENOUS | Status: AC
  Administered 2014-08-20: 1 g via INTRAVENOUS
  Filled 2014-08-20: qty 10

## 2014-08-20 NOTE — ED Notes (Addendum)
Nausea and vomiting 2-3 x week x 3 weeks.  4 x in last 45 minutes, one time 1.5 hours ago. vomited once. Denies blood in emesis.  Denies any CP, SOB, diaphoresis, abdominal pain, fevers, chills.  No new foods or eating at new restaurants. Fell  3 weeks ago from bed, hitting head on floor. Does not know if she lost consciousness. Was not evaluated at that time. Denies dizziness, HAs.

## 2014-08-20 NOTE — Discharge Instructions (Signed)
Drink plenty of fluids and follow up with your md next week °

## 2014-08-20 NOTE — ED Notes (Signed)
Oral contrast completed.  CT called.

## 2014-08-20 NOTE — ED Provider Notes (Signed)
CSN: [id number]     Arrival date & time 08/20/14  1326 History  This chart was scribed for Milton Ferguson, MD by Stephania Fragmin, ED Scribe. This patient was seen in room APA03/APA03 and the patient's care was started at 2:06 PM   Chief Complaint  Patient presents with  . Emesis   Patient is a 81 y.o. female presenting with vomiting. The history is provided by the patient. History limited by: dementia. No language interpreter was used.  Emesis Severity:  Moderate Duration:  3 weeks Timing:  Intermittent Number of daily episodes:  4 today Progression:  Worsening Chronicity:  New Recent urination:  Normal Context: not post-tussive and not self-induced   Relieved by:  Nothing Worsened by:  Nothing tried Ineffective treatments:  None tried Associated symptoms: no abdominal pain, no chills and no fever    LEVEL 5 CAVEAT DUE TO DEMENTIA  HPI Comments: Anne [name] is a 81 y.o. female with a history of dementia who presents to the Emergency Department complaining of gradually worsening intermittent nausea that began as 2-3 episodes per week several weeks ago but which acutely worsened in the past few days, with 4 episodes of vomiting today, per daughter. Daughter states patient reports eating cereal but daughter did not witness this. Her blood sugar is 87. Her daughter denies hematemesis. Patient denies any pain.   Past Medical History  Diagnosis Date  . CLL (chronic lymphocytic leukemia)     stable  . Anemia   . DM (diabetes mellitus)   . Hypercholesteremia   . DJD (degenerative joint disease) of knee     bilat  . Hypertension   . Shortness of breath   . CHF (congestive heart failure)     [place] Class 3 Heart Failure  . Blood transfusion   . CLL (chronic lymphocytic leukemia)   . CLL (chronic lymphocytic leukemia) 04/03/2007    Stage 0 CLL    . Anemia of chronic disease 10/30/2011  . Ischemic cardiomyopathy     EF of 35% on 2D Echo done 04/07/12 moderate intraventricular dyssynchrony,  moderate MR and moderate to severe TR with an RVSP of 31  . Coronary artery disease     three-vessel CAD with CABG -- x5 -- left internal mammary artery to LAD, sequential saphenous vein graft to first diagonal and first obtuse marginal, sequential saphenous vein graft to posterior descending and obtuse marginal 2 (posterolateral), endoscopic vein harvest of righ leg   Past Surgical History  Procedure Laterality Date  . Coronary artery bypass graft  06/22/2009    x5 -- left internal mammary artery to LAD, sequential saphenous vein graft to first diagonal and first obtuse marginal, sequential saphenous vein graft to posterior descending and obtuse marginal 2 (posterolateral), endoscopic vein harvest of righ leg  . Goiter resection    . Thyroidectomy  1970s  . Cataract extraction, bilateral    . Rectal abscess    . Replacement total knee bilateral      bilateral  . Cardiac catheterization  01/18/2011    Est. EF of 25% to 30 - Mildly reduced cardiac output with no signs of decompensated heart failure and normal right heart filling pressures -- Patent bypass grafts with underlying three-vessel coronary artery disease  . Cardiac catheterization  06/14/2009    Est. EF of of 45% - Multivessel coronary artery disease with high-grade lesions in the LAD, left circumflex, and RCA -- Mildly reduced left ventricular function -- No significant aortic stenosis or mitral regurgitation  .  Pacemaker insertion  04/16/2011    biventricular pacemaker insertion  . Eye surgery    . Steroid injections to sacral spine 01/28/13 - dr Brien Few  01/28/13  . Skin graft  09/2013    nose  . Bi-ventricular pacemaker insertion N/A 04/16/2011    Procedure: BI-VENTRICULAR PACEMAKER INSERTION (CRT-P);  Surgeon: Thompson Grayer, MD;  Location: [facility] CATH LAB;  Service: Cardiovascular;  Laterality: N/A;   Family History  Problem Relation Age of Onset  . Heart failure Brother 82  . Heart attack Brother 37  . Heart disease Son 29    Had  CABG x 1  . Hypertension Son    History  Substance Use Topics  . Smoking status: Never Smoker   . Smokeless tobacco: Never Used  . Alcohol Use: No   OB History    No data available     Review of Systems  Unable to perform ROS: Dementia  Constitutional: Negative for chills.  Gastrointestinal: Positive for vomiting. Negative for abdominal pain.    Allergies  Lisinopril  Home Medications   Prior to Admission medications   Medication Sig Start Date End Date Taking? Authorizing Provider  aspirin 81 MG EC tablet Take 81 mg by mouth daily.     Historical Provider, MD  carvedilol (COREG) 6.25 MG tablet TAKE 1 TABLET BY MOUTH TWICE DAILY WITH A MEAL 02/22/14   Pixie Casino, MD  citalopram (CELEXA) 20 MG tablet Take 1 tablet by mouth daily. 08/04/14   Historical Provider, MD  DIOVAN 160 MG tablet Take 160 mg by mouth daily. & 1/2 tab on M, W, F nights    Historical Provider, MD  Docusate Calcium (STOOL SOFTENER PO) Take 2 capsules by mouth daily as needed (constipation).     Historical Provider, MD  donepezil (ARICEPT) 5 MG tablet Take 1 tablet by mouth daily. 06/27/14   Historical Provider, MD  fish oil-omega-3 fatty acids 1000 MG capsule Take 1 g by mouth daily.    Historical Provider, MD  furosemide (LASIX) 20 MG tablet Take 1 tablet by mouth daily. 06/14/14   Historical Provider, MD  HUMALOG KWIKPEN 100 UNIT/ML KiwkPen Inject 0-12 Units into the skin 3 (three) times daily. Based on Sliding Scale. 08/04/14   Historical Provider, MD  HYDROcodone-acetaminophen (NORCO/VICODIN) 5-325 MG per tablet Take 1 tablet by mouth 3 (three) times daily. 07/27/14   Historical Provider, MD  IRON CR PO Take 1 tablet by mouth 3 (three) times a week.    Historical Provider, MD  LC-4 LIDOCAINE EX Apply 1 application topically 3 (three) times daily as needed (pain). To lt knee    Historical Provider, MD  LEVEMIR FLEXTOUCH 100 UNIT/ML Pen Inject into the skin daily. 08/08/14   Historical Provider, MD  LORazepam  (ATIVAN) 0.5 MG tablet Take 0.5 mg by mouth every 8 (eight) hours as needed. FOR NERVES    Historical Provider, MD  metFORMIN (GLUCOPHAGE) 1000 MG tablet Take 1,000 mg by mouth 2 (two) times daily with a meal.     Historical Provider, MD  Multiple Vitamins-Minerals (CENTRUM SILVER PO) Take 1 tablet by mouth daily.     Historical Provider, MD  NAMENDA 10 MG tablet Take 10 mg by mouth 2 (two) times daily.  01/04/11   Historical Provider, MD  pravastatin (PRAVACHOL) 40 MG tablet Take 1 tablet (40 mg total) by mouth daily. 12/15/12   Pixie Casino, MD  TRADJENTA 5 MG TABS tablet Take 5 mg by mouth at bedtime.  Historical Provider, MD  traMADol (ULTRAM) 50 MG tablet Take 50 mg by mouth at bedtime.     Historical Provider, MD  TRULICITY 7.12 WP/8.0DX SOPN Inject 0.5 mLs into the skin once a week. 08/05/14   Historical Provider, MD   BP 143/60 mmHg  Pulse 77  Temp(Src) 97.9 F (36.6 C) (Oral)  Resp 16  Ht 5\' 7"  (1.702 m)  Wt 160 lb (72.576 kg)  BMI 25.05 kg/m2  SpO2 100% Physical Exam  Constitutional: She appears well-developed.  HENT:  Head: Normocephalic.  Dry mucous membranes.  Eyes: Conjunctivae and EOM are normal. No scleral icterus.  Neck: Neck supple. No thyromegaly present.  Cardiovascular: Normal rate and regular rhythm.  Exam reveals no gallop and no friction rub.   No murmur heard. Pulmonary/Chest: No stridor. She has no wheezes. She has no rales. She exhibits no tenderness.  Abdominal: She exhibits no distension. There is no tenderness. There is no rebound.  Musculoskeletal: Normal range of motion. She exhibits no edema.  Lymphadenopathy:    She has no cervical adenopathy.  Neurological: She exhibits normal muscle tone. Coordination normal.  Oriented to person and place only.  Skin: No rash noted. No erythema.  Psychiatric: She has a normal mood and affect. Her behavior is normal.  Nursing note and vitals reviewed.   ED Course  Procedures (including critical care  time)  DIAGNOSTIC STUDIES: Oxygen Saturation is 100% on room air, normal by my interpretation.    COORDINATION OF CARE: 2:09 PM - Discussed treatment plan with pt at bedside which includes IV fluids and XR abdomen, and patient agreed to plan.   Labs Review Labs Reviewed  CBC WITH DIFFERENTIAL/PLATELET - Abnormal; Notable for the following:    WBC 17.9 (*)    RBC 3.59 (*)    Hemoglobin 10.6 (*)    HCT 33.5 (*)    RDW 17.5 (*)    Neutrophils Relative % 17 (*)    Lymphocytes Relative 81 (*)    Lymphs Abs 14.4 (*)    Monocytes Relative 2 (*)    All other components within normal limits  COMPREHENSIVE METABOLIC PANEL - Abnormal; Notable for the following:    BUN 33 (*)    Creatinine, Ser 1.36 (*)    GFR calc non Af Amer 35 (*)    GFR calc Af Amer 40 (*)    All other components within normal limits  URINALYSIS, ROUTINE W REFLEX MICROSCOPIC - Abnormal; Notable for the following:    APPearance HAZY (*)    Leukocytes, UA MODERATE (*)    All other components within normal limits  URINE MICROSCOPIC-ADD ON - Abnormal; Notable for the following:    Bacteria, UA MANY (*)    All other components within normal limits  LIPASE, BLOOD  TROPONIN I    Imaging Review Dg Chest Portable 1 View  08/20/2014   CLINICAL DATA:  Two week history of emesis. Intermittent dyspnea with exertion  EXAM: PORTABLE CHEST - 1 VIEW  COMPARISON:  April 17, 2011  FINDINGS: There is no edema or consolidation. Heart size and pulmonary vascularity are normal. No adenopathy. There is a pacemaker with lead tips attached to the right atrium, right ventricle, and left ventricle. No adenopathy. No pneumothorax.  IMPRESSION: No edema or consolidation.   Electronically Signed   By: Lowella Grip III M.D.   On: 08/20/2014 14:33     EKG Interpretation   Date/Time:  Friday August 20 2014 13:49:39 EDT Ventricular Rate:  77 PR  Interval:  157 QRS Duration: 139 QT Interval:  443 QTC Calculation: 501 R Axis:    124 Text Interpretation:  Sinus rhythm Consider left ventricular hypertrophy  Probable lateral infarct, age indeterminate Nonspecific T abnormalities,  lateral leads Prolonged QT interval Since last tracing lateral ST  depression and QT prolongation are new Confirmed by Eulis Foster  MD, Vira Agar  ([id number]) on 08/20/2014 1:56:10 PM      MDM   Final diagnoses:  None    Vomiting,  Uti,   tx with cipro and zofran and follow up  The chart was scribed for me under my direct supervision.  I personally performed the history, physical, and medical decision making and all procedures in the evaluation of this patient.Milton Ferguson, MD 08/20/14 [phone]

## 2014-08-20 NOTE — ED Notes (Signed)
MD at bedside. 

## 2014-08-23 LAB — URINE CULTURE: Colony Count: >=100000

## 2014-08-24 ENCOUNTER — Telehealth (HOSPITAL_COMMUNITY): Payer: Self-pay

## 2014-08-24 NOTE — ED Notes (Signed)
Post ED Visit - Positive Culture Follow-up  Culture report reviewed by antimicrobial stewardship pharmacist: []  Wes Dulaney, Pharm.D., BCPS [x]  Heide Guile, Pharm.D., BCPS []  Alycia Rossetti, Pharm.D., BCPS []  Allen, Pharm.D., BCPS, AAHIVP []  Legrand Como, Pharm.D., BCPS, AAHIVP []  Isac Sarna, Pharm.D., BCPS  Positive urine culture Treated with cipro, organism sensitive to the same and no further patient follow-up is required at this time.  Ileene Musa 08/24/2014, 10:02 AM

## 2014-09-02 ENCOUNTER — Encounter: Payer: Self-pay | Admitting: Cardiology

## 2014-09-08 ENCOUNTER — Encounter (HOSPITAL_COMMUNITY): Payer: Medicare Other | Attending: Oncology | Admitting: Oncology

## 2014-09-08 ENCOUNTER — Encounter (HOSPITAL_COMMUNITY): Payer: Medicare Other

## 2014-09-08 ENCOUNTER — Encounter (HOSPITAL_COMMUNITY): Payer: Self-pay | Admitting: Oncology

## 2014-09-08 VITALS — BP 144/77 | HR 79 | Temp 97.1°F | Resp 18 | Wt 146.3 lb

## 2014-09-08 DIAGNOSIS — N189 Chronic kidney disease, unspecified: Secondary | ICD-10-CM | POA: Diagnosis not present

## 2014-09-08 DIAGNOSIS — C9110 Chronic lymphocytic leukemia of B-cell type not having achieved remission: Secondary | ICD-10-CM | POA: Diagnosis not present

## 2014-09-08 DIAGNOSIS — D638 Anemia in other chronic diseases classified elsewhere: Secondary | ICD-10-CM | POA: Diagnosis not present

## 2014-09-08 DIAGNOSIS — N183 Chronic kidney disease, stage 3 unspecified: Secondary | ICD-10-CM

## 2014-09-08 DIAGNOSIS — D631 Anemia in chronic kidney disease: Secondary | ICD-10-CM | POA: Diagnosis not present

## 2014-09-08 LAB — CBC WITH DIFFERENTIAL/PLATELET
BASOS ABS: 0 10*3/uL (ref 0.0–0.1)
BASOS PCT: 0 % (ref 0–1)
EOS ABS: 0.1 10*3/uL (ref 0.0–0.7)
Eosinophils Relative: 1 % (ref 0–5)
HCT: 33.9 % — ABNORMAL LOW (ref 36.0–46.0)
Hemoglobin: 10.8 g/dL — ABNORMAL LOW (ref 12.0–15.0)
Lymphocytes Relative: 85 % — ABNORMAL HIGH (ref 12–46)
Lymphs Abs: 13.4 10*3/uL — ABNORMAL HIGH (ref 0.7–4.0)
MCH: 30 pg (ref 26.0–34.0)
MCHC: 31.9 g/dL (ref 30.0–36.0)
MCV: 94.2 fL (ref 78.0–100.0)
Monocytes Absolute: 0.3 10*3/uL (ref 0.1–1.0)
Monocytes Relative: 2 % — ABNORMAL LOW (ref 3–12)
NEUTROS ABS: 2 10*3/uL (ref 1.7–7.7)
NEUTROS PCT: 13 % — AB (ref 43–77)
PLATELETS: 174 10*3/uL (ref 150–400)
RBC: 3.6 MIL/uL — ABNORMAL LOW (ref 3.87–5.11)
RDW: 16.2 % — ABNORMAL HIGH (ref 11.5–15.5)
WBC: 15.8 10*3/uL — ABNORMAL HIGH (ref 4.0–10.5)

## 2014-09-08 MED ORDER — EPOETIN ALFA 40000 UNIT/ML IJ SOLN
40000.0000 [IU] | Freq: Once | INTRAMUSCULAR | Status: AC
  Administered 2014-09-08: 40000 [IU] via SUBCUTANEOUS

## 2014-09-08 MED ORDER — EPOETIN ALFA 40000 UNIT/ML IJ SOLN
INTRAMUSCULAR | Status: AC
  Filled 2014-09-08: qty 1

## 2014-09-08 NOTE — Progress Notes (Signed)
Anne [name] presents today for injection per MD orders. Procrit 40,000 administered SQ in left Abdomen. Administration without incident. Patient tolerated well.

## 2014-09-08 NOTE — Patient Instructions (Signed)
[place] at [facility] Discharge Instructions  RECOMMENDATIONS MADE BY THE CONSULTANT AND ANY TEST RESULTS WILL BE SENT TO YOUR REFERRING PHYSICIAN.  Exam and discussion by Robynn Pane, PA-C Will give you procrit today Try not to lose anymore weight. Report increased fatigue, shortness of breath or other concerns.  Labs and procrit every 4 weeks Office visit in 3 months.  Thank you for choosing [place] at [facility] to provide your oncology and hematology care.  To afford each patient quality time with our provider, please arrive at least 15 minutes before your scheduled appointment time.    You need to re-schedule your appointment should you arrive 10 or more minutes late.  We strive to give you quality time with our providers, and arriving late affects you and other patients whose appointments are after yours.  Also, if you no show three or more times for appointments you may be dismissed from the clinic at the providers discretion.     Again, thank you for choosing [facility].  Our hope is that these requests will decrease the amount of time that you wait before being seen by our physicians.       _____________________________________________________________  Should you have questions after your visit to [facility], please contact our office at [phone] between the hours of 8:30 a.m. and 4:30 p.m.  Voicemails left after 4:30 p.m. will not be returned until the following business day.  For prescription refill requests, have your pharmacy contact our office.

## 2014-09-08 NOTE — Progress Notes (Signed)
Please see doctors encounter for more information 

## 2014-09-08 NOTE — Progress Notes (Signed)
Anne Pizza, MD  [address]  [place] [address]  Anemia of chronic renal failure, stage 3 (moderate) - Plan: CBC with Differential, Ferritin, Comprehensive metabolic panel, Lactate dehydrogenase, Sedimentation rate, CBC with Differential, Ferritin  CLL (chronic lymphocytic leukemia) - Plan: Sedimentation rate, Lactate dehydrogenase  CURRENT THERAPY: Procrit since 12/2010, once monthly  INTERVAL HISTORY: Anne [name] 81 y.o. female returns for followup of CLL with an element of anemia that is likely multifactorial from anemia of chronic renal disease and/or CLL.    I personally reviewed and went over laboratory results with the patient.  The results are noted within this dictation.  I personally reviewed and went over radiographic studies with the patient.  The results are noted within this dictation.    Chart reviewed and ED visit from March 2016 is noted at which time she was diagnosed and treated for a UTI.  Ct abd/pelvis was also performed and hematologically unimpressive.   Her weight is noted to decrease 14 lbs since last month.  I am not sure where that was checked.  A longitudinal review of her weight demonstrates stability of her weight.  She notes that her appetite is good and she snacks throughout the day.  We will need to monitor her weight.  Without a lack of appetite, Anne [name] is not indicated.   Hematologically, she denies any complaints and ROS questioning is negative.  Past Medical History  Diagnosis Date  . CLL (chronic lymphocytic leukemia)     stable  . Anemia   . DM (diabetes mellitus)   . Hypercholesteremia   . DJD (degenerative joint disease) of knee     bilat  . Hypertension   . Shortness of breath   . CHF (congestive heart failure)     [place] Class 3 Heart [place]  . Blood transfusion   . CLL (chronic lymphocytic leukemia)   . CLL (chronic lymphocytic leukemia) 04/03/2007    Stage 0 CLL    . Anemia of chronic disease 10/30/2011  . Ischemic  cardiomyopathy     EF of 35% on 2D Echo done 04/07/12 moderate intraventricular dyssynchrony, moderate MR and moderate to severe TR with an RVSP of 31  . Coronary artery disease     three-vessel CAD with CABG -- x5 -- left internal mammary artery to LAD, sequential saphenous vein graft to first diagonal and first obtuse marginal, sequential saphenous vein graft to posterior descending and obtuse marginal 2 (posterolateral), endoscopic vein harvest of righ leg  . Anemia of chronic renal failure, stage 3 (moderate) 10/30/2011    has CLL (chronic lymphocytic leukemia); HYPOTHYROIDISM; DIABETES MELLITUS, TYPE II, UNCONTROLLED; HYPERLIPIDEMIA; DEPRESSION; CATARACTS; HYPERTENSION; ARTHRITIS; LOW BACK PAIN, CHRONIC; BURSITIS, ACROMIOCLAVICULAR, LEFT; MEMORY LOSS; FECAL INCONTINENCE; Ischemic cardiomyopathy; Chronic systolic dysfunction of left ventricle; Anemia of chronic renal failure, stage 3 (moderate); Pacemaker-St.Jude; Coronary artery disease; and S/P CABG x 5 on her problem list.     is allergic to lisinopril.  Anne [name] does not currently have medications on file.  Past Surgical History  Procedure Laterality Date  . Coronary artery bypass graft  06/22/2009    x5 -- left internal mammary artery to LAD, sequential saphenous vein graft to first diagonal and first obtuse marginal, sequential saphenous vein graft to posterior descending and obtuse marginal 2 (posterolateral), endoscopic vein harvest of righ leg  . Goiter resection    . Thyroidectomy  1970s  . Cataract extraction, bilateral    . Rectal abscess    .  Replacement total knee bilateral      bilateral  . Cardiac catheterization  01/18/2011    Est. EF of 25% to 30 - Mildly reduced cardiac output with no signs of decompensated heart failure and normal right heart filling pressures -- Patent bypass grafts with underlying three-vessel coronary artery disease  . Cardiac catheterization  06/14/2009    Est. EF of of 45% - Multivessel coronary  artery disease with high-grade lesions in the LAD, left circumflex, and RCA -- Mildly reduced left ventricular function -- No significant aortic stenosis or mitral regurgitation  . Pacemaker insertion  04/16/2011    biventricular pacemaker insertion  . Eye surgery    . Steroid injections to sacral spine 01/28/13 - dr Brien Few  01/28/13  . Skin graft  09/2013    nose  . Bi-ventricular pacemaker insertion N/A 04/16/2011    Procedure: BI-VENTRICULAR PACEMAKER INSERTION (CRT-P);  Surgeon: Thompson Grayer, MD;  Location: [facility] CATH LAB;  Service: Cardiovascular;  Laterality: N/A;    Denies any headaches, dizziness, double vision, fevers, chills, night sweats, nausea, vomiting, diarrhea, constipation, chest pain, heart palpitations, shortness of breath, blood in stool, black tarry stool, urinary pain, urinary burning, urinary frequency, hematuria.   PHYSICAL EXAMINATION  ECOG PERFORMANCE STATUS: 1 - Symptomatic but completely ambulatory  Filed Vitals:   09/08/14 1200  BP: 144/77  Pulse: 79  Temp: 97.1 F (36.2 C)  Resp: 18    GENERAL:alert, no distress, well nourished, well developed, comfortable, cooperative, smiling and accompanied by her son. SKIN: skin color, texture, turgor are normal, no rashes or significant lesions HEAD: Normocephalic, No masses, lesions, tenderness or abnormalities EYES: normal, PERRLA, EOMI, Conjunctiva are pink and non-injected EARS: External ears normal OROPHARYNX:lips, buccal mucosa, and tongue normal and mucous membranes are moist  NECK: supple, no adenopathy, thyroid normal size, non-tender, without nodularity, trachea midline LYMPH:  no palpable lymphadenopathy, no hepatosplenomegaly BREAST:not examined LUNGS: clear to auscultation and percussion HEART: regular rate & rhythm, no murmurs, no gallops, S1 normal and S2 normal ABDOMEN:abdomen soft, non-tender, normal bowel sounds and no masses or organomegaly BACK: Back symmetric, no curvature., No CVA  tenderness EXTREMITIES:less then 2 second capillary refill, no joint deformities, effusion, or inflammation, no edema, no skin discoloration, no clubbing, no cyanosis  NEURO: alert & oriented x 3 with fluent speech, no focal motor/sensory deficits, gait normal   LABORATORY DATA: CBC    Component Value Date/Time   WBC 15.8* 09/08/2014 1209   RBC 3.60* 09/08/2014 1209   RBC 3.90 02/15/2014 1000   HGB 10.8* 09/08/2014 1209   HCT 33.9* 09/08/2014 1209   PLT 174 09/08/2014 1209   MCV 94.2 09/08/2014 1209   MCH 30.0 09/08/2014 1209   MCHC 31.9 09/08/2014 1209   RDW 16.2* 09/08/2014 1209   LYMPHSABS 13.4* 09/08/2014 1209   MONOABS 0.3 09/08/2014 1209   EOSABS 0.1 09/08/2014 1209   BASOSABS 0.0 09/08/2014 1209      Chemistry      Component Value Date/Time   NA 137 08/20/2014 1405   K 4.7 08/20/2014 1405   CL 104 08/20/2014 1405   CO2 23 08/20/2014 1405   BUN 33* 08/20/2014 1405   CREATININE 1.36* 08/20/2014 1405   CREATININE 1.07 04/10/2011 1027      Component Value Date/Time   CALCIUM 9.4 08/20/2014 1405   ALKPHOS 57 08/20/2014 1405   AST 22 08/20/2014 1405   ALT 10 08/20/2014 1405   BILITOT 0.5 08/20/2014 1405      RADIOGRAPHIC STUDIES:  Ct Abdomen Pelvis W Contrast  08/20/2014   CLINICAL DATA:  Vomiting for several days, history of CLL, diabetes  EXAM: CT ABDOMEN AND PELVIS WITH CONTRAST  TECHNIQUE: Multidetector CT imaging of the abdomen and pelvis was performed using the standard protocol following bolus administration of intravenous contrast.  CONTRAST:  66mL OMNIPAQUE IOHEXOL 300 MG/ML SOLN, 68mL OMNIPAQUE IOHEXOL 300 MG/ML SOLN  COMPARISON:  None.  FINDINGS: Lung bases are unremarkable. There is mild dextroscoliosis of lumbar spine. Sagittal images of the spine shows multilevel extensive degenerative changes thoracolumbar spine. Enhanced liver is unremarkable. No calcified gallstones are noted within gallbladder. Atherosclerotic calcifications are noted celiac and SMA  origin, bilateral renal artery or strain, abdominal aorta and bilateral common iliac arteries.  Nonspecific mild thickening of pyloric wall without evidence of gastric outlet obstruction. No small bowel obstruction. No ascites or free air. No adenopathy.  Enhanced pancreas spleen and adrenal glands are unremarkable. Enhanced kidneys are symmetrical in size. No hydronephrosis or hydroureter.  There is no pericecal inflammation. The terminal ileum is unremarkable. Moderate stool is noted in transverse colon. Transverse colon measures at least 4.9 cm distended with stool. Some stool noted in descending colon. No distal colonic obstruction. Some stool and gas noted within rectum. The uterus is atrophic. There is a cyst in left upper pelvis posteriorly measures 3.2 cm. This may represent a pelvic inclusion cyst or in adnexal cyst. Measures 4.2 Hounsfield units in attenuation consistent with simple fluid. Moderate distended urinary bladder. Mild degenerative changes bilateral hip joints and pubic symphysis. Degenerative changes bilateral SI joints.  Delayed renal images shows bilateral renal symmetrical excretion. Bilateral visualized proximal ureter is unremarkable.  IMPRESSION: 1. No small bowel obstruction.  No hydronephrosis or hydroureter. 2. Nonspecific mild thickening of pyloric wall without evidence of gastric outlet obstruction. 3. No pericecal inflammation. Moderate to abundant stool noted within transverse colon without evidence of colonic obstruction. The mid transverse colon measures about 4.9 cm in diameter. 4. No distal colonic obstruction. 5. No hydronephrosis or hydroureter. 6. There is atrophic uterus. Cystic structure in left upper pelvis posteriorly measures 3.2 cm. This may represent adnexal cyst or pelvic inclusion cyst. Is containing simple fluid. 7. Degenerative changes thoracolumbar spine. 8. Atherosclerotic vascular calcifications as described above. 9. Moderate distended urinary bladder.    Electronically Signed   By: Lahoma Crocker M.D.   On: 08/20/2014 17:47   Dg Chest Portable 1 View  08/20/2014   CLINICAL DATA:  Two week history of emesis. Intermittent dyspnea with exertion  EXAM: PORTABLE CHEST - 1 VIEW  COMPARISON:  April 17, 2011  FINDINGS: There is no edema or consolidation. Heart size and pulmonary vascularity are normal. No adenopathy. There is a pacemaker with lead tips attached to the right atrium, right ventricle, and left ventricle. No adenopathy. No pneumothorax.  IMPRESSION: No edema or consolidation.   Electronically Signed   By: Lowella Grip III M.D.   On: 08/20/2014 14:33      ASSESSMENT AND PLAN:  CLL (chronic lymphocytic leukemia) 81 year old female with long-standing CLL, and multifactorial anemia (CLL and renal disease). She had a bone marrow biopsy many years back. She has had a long-standing anemia that is felt to be secondary to her chronic kidney disease. She is on Procrit 40,000 units monthly with stability of her anemia. Her daughter states they do not want to discontinue that, as her blood counts have been stable for some time. Additionally, she is not interested in additional evaluation of anemia. We will  therefore continue with the plan as scheduled.  Labs monthly: CBC diff, ferritin.  Labs today: CBC diff, CMET, LDH, ESR  Labs in 3 months: CBC diff, CMET, LDH, ESR  We will monitor weight.  Return in 3 months for follow-up, sooner if needed.     THERAPY PLAN:  Continue with supportive therapy as planned.  All questions were answered. The patient knows to call the clinic with any problems, questions or concerns. We can certainly see the patient much sooner if necessary.  Patient and plan discussed with Dr. Ancil Linsey and she is in agreement with the aforementioned.   This note is electronically signed by: Robynn Pane 09/08/2014 12:54 PM

## 2014-09-08 NOTE — Assessment & Plan Note (Addendum)
81 year old female with long-standing CLL, and multifactorial anemia (CLL and renal disease). She had a bone marrow biopsy many years back. She has had a long-standing anemia that is felt to be secondary to her chronic kidney disease. She is on Procrit 40,000 units monthly with stability of her anemia. Her daughter states they do not want to discontinue that, as her blood counts have been stable for some time. Additionally, she is not interested in additional evaluation of anemia. We will therefore continue with the plan as scheduled.  Labs monthly: CBC diff, ferritin.  Labs today: CBC diff, CMET, LDH, ESR  Labs in 3 months: CBC diff, CMET, LDH, ESR  We will monitor weight.  Return in 3 months for follow-up, sooner if needed.

## 2014-09-09 LAB — FERRITIN: FERRITIN: 142 ng/mL (ref 10–291)

## 2014-09-15 ENCOUNTER — Ambulatory Visit (HOSPITAL_COMMUNITY): Payer: Self-pay | Admitting: Oncology

## 2014-09-15 ENCOUNTER — Ambulatory Visit (HOSPITAL_COMMUNITY): Payer: Medicare Other

## 2014-09-15 ENCOUNTER — Other Ambulatory Visit (HOSPITAL_COMMUNITY): Payer: Medicare Other

## 2014-10-06 ENCOUNTER — Encounter (HOSPITAL_BASED_OUTPATIENT_CLINIC_OR_DEPARTMENT_OTHER): Payer: Medicare Other

## 2014-10-06 ENCOUNTER — Encounter (HOSPITAL_COMMUNITY): Payer: Medicare Other | Attending: Oncology

## 2014-10-06 DIAGNOSIS — D631 Anemia in chronic kidney disease: Secondary | ICD-10-CM | POA: Diagnosis not present

## 2014-10-06 DIAGNOSIS — N183 Chronic kidney disease, stage 3 (moderate): Principal | ICD-10-CM

## 2014-10-06 DIAGNOSIS — D638 Anemia in other chronic diseases classified elsewhere: Secondary | ICD-10-CM | POA: Diagnosis not present

## 2014-10-06 DIAGNOSIS — N189 Chronic kidney disease, unspecified: Secondary | ICD-10-CM | POA: Diagnosis not present

## 2014-10-06 DIAGNOSIS — C9110 Chronic lymphocytic leukemia of B-cell type not having achieved remission: Secondary | ICD-10-CM | POA: Insufficient documentation

## 2014-10-06 LAB — CBC WITH DIFFERENTIAL/PLATELET
Basophils Absolute: 0 10*3/uL (ref 0.0–0.1)
Basophils Relative: 0 % (ref 0–1)
EOS ABS: 0.1 10*3/uL (ref 0.0–0.7)
EOS PCT: 0 % (ref 0–5)
HCT: 36.2 % (ref 36.0–46.0)
Hemoglobin: 11.4 g/dL — ABNORMAL LOW (ref 12.0–15.0)
LYMPHS ABS: 15.1 10*3/uL — AB (ref 0.7–4.0)
LYMPHS PCT: 83 % — AB (ref 12–46)
MCH: 29.5 pg (ref 26.0–34.0)
MCHC: 31.5 g/dL (ref 30.0–36.0)
MCV: 93.8 fL (ref 78.0–100.0)
MONO ABS: 0.2 10*3/uL (ref 0.1–1.0)
MONOS PCT: 1 % — AB (ref 3–12)
Neutro Abs: 2.8 10*3/uL (ref 1.7–7.7)
Neutrophils Relative %: 15 % — ABNORMAL LOW (ref 43–77)
PLATELETS: 188 10*3/uL (ref 150–400)
RBC: 3.86 MIL/uL — AB (ref 3.87–5.11)
RDW: 15.5 % (ref 11.5–15.5)
WBC: 18.1 10*3/uL — AB (ref 4.0–10.5)

## 2014-10-06 LAB — FERRITIN: FERRITIN: 101 ng/mL (ref 11–307)

## 2014-10-06 NOTE — Patient Instructions (Signed)
[place] at [facility] Discharge Instructions  RECOMMENDATIONS MADE BY THE CONSULTANT AND ANY TEST RESULTS WILL BE SENT TO YOUR REFERRING PHYSICIAN.  Hemoglobin 11.4 today. Procrit not needed today. Return as scheduled in 4 weeks for lab work and injection if needed.  Thank you for choosing [place] at [facility] to provide your oncology and hematology care.  To afford each patient quality time with our provider, please arrive at least 15 minutes before your scheduled appointment time.    You need to re-schedule your appointment should you arrive 10 or more minutes late.  We strive to give you quality time with our providers, and arriving late affects you and other patients whose appointments are after yours.  Also, if you no show three or more times for appointments you may be dismissed from the clinic at the providers discretion.     Again, thank you for choosing [facility].  Our hope is that these requests will decrease the amount of time that you wait before being seen by our physicians.       _____________________________________________________________  Should you have questions after your visit to [facility], please contact our office at [phone] between the hours of 8:30 a.m. and 4:30 p.m.  Voicemails left after 4:30 p.m. will not be returned until the following business day.  For prescription refill requests, have your pharmacy contact our office.

## 2014-10-06 NOTE — Progress Notes (Signed)
Labs drawn

## 2014-10-06 NOTE — Progress Notes (Signed)
Hemoglobin 11.4. Procrit not given, treatment parameters not met. Return as scheduled.

## 2014-11-08 ENCOUNTER — Encounter (HOSPITAL_COMMUNITY): Payer: Medicare Other | Attending: Oncology

## 2014-11-08 ENCOUNTER — Encounter (HOSPITAL_BASED_OUTPATIENT_CLINIC_OR_DEPARTMENT_OTHER): Payer: Medicare Other

## 2014-11-08 ENCOUNTER — Encounter (HOSPITAL_COMMUNITY): Payer: Self-pay

## 2014-11-08 VITALS — BP 131/56 | HR 62 | Temp 97.7°F | Resp 16

## 2014-11-08 DIAGNOSIS — N189 Chronic kidney disease, unspecified: Secondary | ICD-10-CM | POA: Diagnosis not present

## 2014-11-08 DIAGNOSIS — N183 Chronic kidney disease, stage 3 (moderate): Principal | ICD-10-CM

## 2014-11-08 DIAGNOSIS — D631 Anemia in chronic kidney disease: Secondary | ICD-10-CM

## 2014-11-08 DIAGNOSIS — D638 Anemia in other chronic diseases classified elsewhere: Secondary | ICD-10-CM | POA: Diagnosis not present

## 2014-11-08 DIAGNOSIS — C9110 Chronic lymphocytic leukemia of B-cell type not having achieved remission: Secondary | ICD-10-CM

## 2014-11-08 LAB — CBC WITH DIFFERENTIAL/PLATELET
Basophils Absolute: 0 10*3/uL (ref 0.0–0.1)
Basophils Relative: 0 % (ref 0–1)
EOS PCT: 1 % (ref 0–5)
Eosinophils Absolute: 0.2 10*3/uL (ref 0.0–0.7)
HCT: 31.5 % — ABNORMAL LOW (ref 36.0–46.0)
Hemoglobin: 10.1 g/dL — ABNORMAL LOW (ref 12.0–15.0)
Lymphocytes Relative: 81 % — ABNORMAL HIGH (ref 12–46)
Lymphs Abs: 13.8 10*3/uL — ABNORMAL HIGH (ref 0.7–4.0)
MCH: 30 pg (ref 26.0–34.0)
MCHC: 32.1 g/dL (ref 30.0–36.0)
MCV: 93.5 fL (ref 78.0–100.0)
MONOS PCT: 2 % — AB (ref 3–12)
Monocytes Absolute: 0.3 10*3/uL (ref 0.1–1.0)
NEUTROS PCT: 16 % — AB (ref 43–77)
Neutro Abs: 2.8 10*3/uL (ref 1.7–7.7)
PLATELETS: 160 10*3/uL (ref 150–400)
RBC: 3.37 MIL/uL — AB (ref 3.87–5.11)
RDW: 14.7 % (ref 11.5–15.5)
WBC: 17.1 10*3/uL — ABNORMAL HIGH (ref 4.0–10.5)

## 2014-11-08 LAB — FERRITIN: FERRITIN: 113 ng/mL (ref 11–307)

## 2014-11-08 MED ORDER — EPOETIN ALFA 40000 UNIT/ML IJ SOLN
40000.0000 [IU] | Freq: Once | INTRAMUSCULAR | Status: AC
  Administered 2014-11-08: 40000 [IU] via SUBCUTANEOUS

## 2014-11-08 MED ORDER — EPOETIN ALFA 40000 UNIT/ML IJ SOLN
INTRAMUSCULAR | Status: AC
  Filled 2014-11-08: qty 1

## 2014-11-08 NOTE — Patient Instructions (Signed)
[place] at [facility] Discharge Instructions  RECOMMENDATIONS MADE BY THE CONSULTANT AND ANY TEST RESULTS WILL BE SENT TO YOUR REFERRING PHYSICIAN.  Procrit injection today. Return as scheduled for lab work, injections, and office visit.   Thank you for choosing [place] at [facility] to provide your oncology and hematology care.  To afford each patient quality time with our provider, please arrive at least 15 minutes before your scheduled appointment time.    You need to re-schedule your appointment should you arrive 10 or more minutes late.  We strive to give you quality time with our providers, and arriving late affects you and other patients whose appointments are after yours.  Also, if you no show three or more times for appointments you may be dismissed from the clinic at the providers discretion.     Again, thank you for choosing [facility].  Our hope is that these requests will decrease the amount of time that you wait before being seen by our physicians.       _____________________________________________________________  Should you have questions after your visit to [facility], please contact our office at [phone] between the hours of 8:30 a.m. and 4:30 p.m.  Voicemails left after 4:30 p.m. will not be returned until the following business day.  For prescription refill requests, have your pharmacy contact our office.

## 2014-11-08 NOTE — Progress Notes (Signed)
Labs drawn

## 2014-11-17 ENCOUNTER — Encounter: Payer: Self-pay | Admitting: Internal Medicine

## 2014-11-17 ENCOUNTER — Ambulatory Visit (INDEPENDENT_AMBULATORY_CARE_PROVIDER_SITE_OTHER): Payer: Medicare Other | Admitting: *Deleted

## 2014-11-17 DIAGNOSIS — I255 Ischemic cardiomyopathy: Secondary | ICD-10-CM | POA: Diagnosis not present

## 2014-11-17 NOTE — Progress Notes (Signed)
Remote pacemaker transmission.   

## 2014-11-21 LAB — CUP PACEART REMOTE DEVICE CHECK
Battery Remaining Longevity: 88 mo
Battery Remaining Percentage: 81 %
Brady Statistic AP VS Percent: 1 %
Brady Statistic AS VP Percent: 98 %
Brady Statistic AS VS Percent: 1 %
Date Time Interrogation Session: 20160622061824
Lead Channel Impedance Value: 410 Ohm
Lead Channel Impedance Value: 580 Ohm
Lead Channel Pacing Threshold Amplitude: 0.875 V
Lead Channel Pacing Threshold Amplitude: 1.375 V
Lead Channel Pacing Threshold Pulse Width: 0.4 ms
Lead Channel Pacing Threshold Pulse Width: 0.4 ms
Lead Channel Setting Pacing Amplitude: 2 V
Lead Channel Setting Pacing Amplitude: 2 V
Lead Channel Setting Pacing Amplitude: 2.375
Lead Channel Setting Pacing Pulse Width: 0.4 ms
MDC IDC MSMT BATTERY VOLTAGE: 2.93 V
MDC IDC MSMT LEADCHNL LV IMPEDANCE VALUE: 910 Ohm
MDC IDC MSMT LEADCHNL RA SENSING INTR AMPL: 2.2 mV
MDC IDC MSMT LEADCHNL RV PACING THRESHOLD AMPLITUDE: 1 V
MDC IDC MSMT LEADCHNL RV PACING THRESHOLD PULSEWIDTH: 0.4 ms
MDC IDC MSMT LEADCHNL RV SENSING INTR AMPL: 10 mV
MDC IDC SET LEADCHNL LV PACING PULSEWIDTH: 0.4 ms
MDC IDC SET LEADCHNL RV SENSING SENSITIVITY: 2 mV
MDC IDC STAT BRADY AP VP PERCENT: 1 %
MDC IDC STAT BRADY RA PERCENT PACED: 1.4 %
Pulse Gen Model: 3210
Pulse Gen Serial Number: 2707573

## 2014-11-22 ENCOUNTER — Other Ambulatory Visit: Payer: Self-pay

## 2014-11-24 ENCOUNTER — Encounter: Payer: Self-pay | Admitting: Cardiology

## 2014-12-03 ENCOUNTER — Other Ambulatory Visit (HOSPITAL_COMMUNITY): Payer: Self-pay

## 2014-12-09 ENCOUNTER — Encounter (HOSPITAL_COMMUNITY): Payer: Medicare Other

## 2014-12-09 ENCOUNTER — Encounter (HOSPITAL_BASED_OUTPATIENT_CLINIC_OR_DEPARTMENT_OTHER): Payer: Medicare Other | Admitting: Oncology

## 2014-12-09 ENCOUNTER — Encounter (HOSPITAL_COMMUNITY): Payer: Medicare Other | Attending: Oncology

## 2014-12-09 ENCOUNTER — Ambulatory Visit (HOSPITAL_COMMUNITY): Payer: Medicare Other | Admitting: Oncology

## 2014-12-09 ENCOUNTER — Other Ambulatory Visit (HOSPITAL_COMMUNITY): Payer: Medicare Other

## 2014-12-09 VITALS — BP 130/57 | HR 72 | Temp 98.2°F | Resp 20 | Wt 151.1 lb

## 2014-12-09 DIAGNOSIS — D638 Anemia in other chronic diseases classified elsewhere: Secondary | ICD-10-CM | POA: Diagnosis present

## 2014-12-09 DIAGNOSIS — D630 Anemia in neoplastic disease: Secondary | ICD-10-CM

## 2014-12-09 DIAGNOSIS — C9110 Chronic lymphocytic leukemia of B-cell type not having achieved remission: Secondary | ICD-10-CM

## 2014-12-09 DIAGNOSIS — N189 Chronic kidney disease, unspecified: Secondary | ICD-10-CM

## 2014-12-09 DIAGNOSIS — D649 Anemia, unspecified: Secondary | ICD-10-CM | POA: Diagnosis not present

## 2014-12-09 DIAGNOSIS — N183 Chronic kidney disease, stage 3 unspecified: Secondary | ICD-10-CM

## 2014-12-09 DIAGNOSIS — D631 Anemia in chronic kidney disease: Secondary | ICD-10-CM | POA: Diagnosis not present

## 2014-12-09 LAB — COMPREHENSIVE METABOLIC PANEL
ALT: 15 U/L (ref 14–54)
AST: 21 U/L (ref 15–41)
Albumin: 3.7 g/dL (ref 3.5–5.0)
Alkaline Phosphatase: 58 U/L (ref 38–126)
Anion gap: 9 (ref 5–15)
BUN: 28 mg/dL — ABNORMAL HIGH (ref 6–20)
CO2: 25 mmol/L (ref 22–32)
Calcium: 8.9 mg/dL (ref 8.9–10.3)
Chloride: 104 mmol/L (ref 101–111)
Creatinine, Ser: 1.22 mg/dL — ABNORMAL HIGH (ref 0.44–1.00)
GFR calc Af Amer: 46 mL/min — ABNORMAL LOW (ref >60)
GFR calc non Af Amer: 40 mL/min — ABNORMAL LOW (ref >60)
GLUCOSE: 203 mg/dL — AB (ref 65–99)
Potassium: 5.2 mmol/L — ABNORMAL HIGH (ref 3.5–5.1)
Sodium: 138 mmol/L (ref 135–145)
Total Bilirubin: 0.7 mg/dL (ref 0.3–1.2)
Total Protein: 6.4 g/dL — ABNORMAL LOW (ref 6.5–8.1)

## 2014-12-09 LAB — CBC WITH DIFFERENTIAL/PLATELET
Basophils Absolute: 0 10*3/uL (ref 0.0–0.1)
Basophils Relative: 0 % (ref 0–1)
EOS PCT: 1 % (ref 0–5)
Eosinophils Absolute: 0.2 10*3/uL (ref 0.0–0.7)
HCT: 32.9 % — ABNORMAL LOW (ref 36.0–46.0)
Hemoglobin: 10.8 g/dL — ABNORMAL LOW (ref 12.0–15.0)
LYMPHS ABS: 11.4 10*3/uL — AB (ref 0.7–4.0)
LYMPHS PCT: 77 % — AB (ref 12–46)
MCH: 30.6 pg (ref 26.0–34.0)
MCHC: 32.8 g/dL (ref 30.0–36.0)
MCV: 93.2 fL (ref 78.0–100.0)
Monocytes Absolute: 0.3 10*3/uL (ref 0.1–1.0)
Monocytes Relative: 2 % — ABNORMAL LOW (ref 3–12)
NEUTROS ABS: 3 10*3/uL (ref 1.7–7.7)
Neutrophils Relative %: 20 % — ABNORMAL LOW (ref 43–77)
Platelets: 186 10*3/uL (ref 150–400)
RBC: 3.53 MIL/uL — ABNORMAL LOW (ref 3.87–5.11)
RDW: 14.9 % (ref 11.5–15.5)
WBC: 14.8 10*3/uL — AB (ref 4.0–10.5)

## 2014-12-09 LAB — LACTATE DEHYDROGENASE: LDH: 153 U/L (ref 98–192)

## 2014-12-09 LAB — SEDIMENTATION RATE: SED RATE: 44 mm/h — AB (ref 0–22)

## 2014-12-09 MED ORDER — EPOETIN ALFA 40000 UNIT/ML IJ SOLN
40000.0000 [IU] | Freq: Once | INTRAMUSCULAR | Status: AC
  Administered 2014-12-09: 40000 [IU] via SUBCUTANEOUS

## 2014-12-09 MED ORDER — EPOETIN ALFA 40000 UNIT/ML IJ SOLN
INTRAMUSCULAR | Status: AC
  Filled 2014-12-09: qty 1

## 2014-12-09 NOTE — Progress Notes (Signed)
Anne Cahill, MD  [place] [address]  CLL (chronic lymphocytic leukemia) - Plan: PROCRIT TREATMENT CONDITION, epoetin alfa (EPOGEN,PROCRIT) injection 40,000 Units, PROCRIT TREATMENT CONDITION, CBC with Differential, Comprehensive metabolic panel, Lactate dehydrogenase, Sedimentation rate  Anemia of chronic disease - Plan: PROCRIT TREATMENT CONDITION, epoetin alfa (EPOGEN,PROCRIT) injection 40,000 Units, PROCRIT TREATMENT CONDITION, CBC with Differential, Comprehensive metabolic panel  CURRENT THERAPY: Procrit since 12/2010, once monthly  INTERVAL HISTORY: Anne [name] 81 y.o. female returns for followup of CLL with an element of anemia that is likely multifactorial from anemia of chronic renal disease and/or CLL.    I personally reviewed and went over laboratory results with the patient.  The results are noted within this dictation.  I personally reviewed and went over radiographic studies with the patient.  The results are noted within this dictation.    Her weight was noted to decrease 14 lbs on her last visit, it is now up about 5 lbs.  Past Medical History  Diagnosis Date  . CLL (chronic lymphocytic leukemia)     stable  . Anemia   . DM (diabetes mellitus)   . Hypercholesteremia   . DJD (degenerative joint disease) of knee     bilat  . Hypertension   . Shortness of breath   . CHF (congestive heart failure)     [place] Class 3 Heart [place]  . Blood transfusion   . CLL (chronic lymphocytic leukemia)   . CLL (chronic lymphocytic leukemia) 04/03/2007    Stage 0 CLL    . Anemia of chronic disease 10/30/2011  . Ischemic cardiomyopathy     EF of 35% on 2D Echo done 04/07/12 moderate intraventricular dyssynchrony, moderate MR and moderate to severe TR with an RVSP of 31  . Coronary artery disease     three-vessel CAD with CABG -- x5 -- left internal mammary artery to LAD, sequential saphenous vein graft to first diagonal and first obtuse marginal, sequential  saphenous vein graft to posterior descending and obtuse marginal 2 (posterolateral), endoscopic vein harvest of righ leg  . Anemia of chronic renal failure, stage 3 (moderate) 10/30/2011    has CLL (chronic lymphocytic leukemia); HYPOTHYROIDISM; DIABETES MELLITUS, TYPE II, UNCONTROLLED; HYPERLIPIDEMIA; DEPRESSION; CATARACTS; HYPERTENSION; ARTHRITIS; LOW BACK PAIN, CHRONIC; BURSITIS, ACROMIOCLAVICULAR, LEFT; MEMORY LOSS; FECAL INCONTINENCE; Ischemic cardiomyopathy; Chronic systolic dysfunction of left ventricle; Anemia of chronic renal failure, stage 3 (moderate); Pacemaker-St.Jude; Coronary artery disease; and S/P CABG x 5 on her problem list.     is allergic to lisinopril.  We administered epoetin alfa.  Past Surgical History  Procedure Laterality Date  . Coronary artery bypass graft  06/22/2009    x5 -- left internal mammary artery to LAD, sequential saphenous vein graft to first diagonal and first obtuse marginal, sequential saphenous vein graft to posterior descending and obtuse marginal 2 (posterolateral), endoscopic vein harvest of righ leg  . Goiter resection    . Thyroidectomy  1970s  . Cataract extraction, bilateral    . Rectal abscess    . Replacement total knee bilateral      bilateral  . Cardiac catheterization  01/18/2011    Est. EF of 25% to 30 - Mildly reduced cardiac output with no signs of decompensated heart failure and normal right heart filling pressures -- Patent bypass grafts with underlying three-vessel coronary artery disease  . Cardiac catheterization  06/14/2009    Est. EF of of 45% - Multivessel coronary artery disease with  high-grade lesions in the LAD, left circumflex, and RCA -- Mildly reduced left ventricular function -- No significant aortic stenosis or mitral regurgitation  . Pacemaker insertion  04/16/2011    biventricular pacemaker insertion  . Eye surgery    . Steroid injections to sacral spine 01/28/13 - dr Brien Few  01/28/13  . Skin graft  09/2013    nose  .  Bi-ventricular pacemaker insertion N/A 04/16/2011    Procedure: BI-VENTRICULAR PACEMAKER INSERTION (CRT-P);  Surgeon: Thompson Grayer, MD;  Location: [facility] CATH LAB;  Service: Cardiovascular;  Laterality: N/A;    Denies any headaches, dizziness, double vision, fevers, chills, night sweats, nausea, vomiting, diarrhea, constipation, chest pain, heart palpitations, shortness of breath, blood in stool, black tarry stool, urinary pain, urinary burning, urinary frequency, hematuria.   PHYSICAL EXAMINATION  ECOG PERFORMANCE STATUS: 1 - Symptomatic but completely ambulatory  Filed Vitals:   12/09/14 1252  BP: 130/57  Pulse: 72  Temp: 98.2 F (36.8 C)  Resp: 20    GENERAL:alert, no distress, well nourished, well developed, comfortable, cooperative, smiling and accompanied by her son. SKIN: skin color, texture, turgor are normal, no rashes or significant lesions HEAD: Normocephalic, No masses, lesions, tenderness or abnormalities EYES: normal, PERRLA, EOMI, Conjunctiva are pink and non-injected EARS: External ears normal OROPHARYNX:lips, buccal mucosa, and tongue normal and mucous membranes are moist  NECK: supple, no adenopathy, thyroid normal size, non-tender, without nodularity, trachea midline LYMPH:  no palpable lymphadenopathy, no hepatosplenomegaly BREAST:not examined LUNGS: clear to auscultation and percussion HEART: regular rate & rhythm, no murmurs, no gallops, S1 normal and S2 normal ABDOMEN:abdomen soft, non-tender, normal bowel sounds and no masses or organomegaly BACK: Back symmetric, no curvature., No CVA tenderness EXTREMITIES:less then 2 second capillary refill, no joint deformities, effusion, or inflammation, no edema, no skin discoloration, no clubbing, no cyanosis  NEURO: alert & oriented x 3 with fluent speech, no focal motor/sensory deficits, gait normal   LABORATORY DATA: CBC    Component Value Date/Time   WBC 14.8* 12/09/2014 1209   RBC 3.53* 12/09/2014 1209   RBC  3.90 02/15/2014 1000   HGB 10.8* 12/09/2014 1209   HCT 32.9* 12/09/2014 1209   PLT 186 12/09/2014 1209   MCV 93.2 12/09/2014 1209   MCH 30.6 12/09/2014 1209   MCHC 32.8 12/09/2014 1209   RDW 14.9 12/09/2014 1209   LYMPHSABS 11.4* 12/09/2014 1209   MONOABS 0.3 12/09/2014 1209   EOSABS 0.2 12/09/2014 1209   BASOSABS 0.0 12/09/2014 1209      Chemistry      Component Value Date/Time   NA 138 12/09/2014 1208   K 5.2* 12/09/2014 1208   CL 104 12/09/2014 1208   CO2 25 12/09/2014 1208   BUN 28* 12/09/2014 1208   CREATININE 1.22* 12/09/2014 1208   CREATININE 1.07 04/10/2011 1027      Component Value Date/Time   CALCIUM 8.9 12/09/2014 1208   ALKPHOS 58 12/09/2014 1208   AST 21 12/09/2014 1208   ALT 15 12/09/2014 1208   BILITOT 0.7 12/09/2014 1208      RADIOGRAPHIC STUDIES:  No results found.    ASSESSMENT AND PLAN:  CLL (chronic lymphocytic leukemia) 81 year old female with long-standing CLL, and multifactorial anemia (CLL and renal disease). She had a bone marrow biopsy many years back. She has had a long-standing anemia that is felt to be secondary to her chronic kidney disease. She is on Procrit 40,000 units monthly with stability of her anemia. Her daughter states they do not want to discontinue  that, as her blood counts have been stable for some time. Additionally, she is not interested in additional evaluation of anemia. We will therefore continue with the plan as scheduled.  Labs monthly: CBC diff, ferritin.  Labs today: CBC diff, CMET, LDH, ESR  Labs in 4 months: CBC diff, CMET, LDH, ESR  Procrit given today.  Supportive therapy plan reviewed.  Return in 4 months for follow-up, sooner if needed.    THERAPY PLAN:  Continue with supportive therapy as planned.  All questions were answered. The patient knows to call the clinic with any problems, questions or concerns. We can certainly see the patient much sooner if necessary.  Patient and plan discussed with  Dr. Ancil Linsey and she is in agreement with the aforementioned.   This note is electronically signed by: Robynn Pane 12/09/2014 1:25 PM

## 2014-12-09 NOTE — Assessment & Plan Note (Addendum)
81 year old female with long-standing CLL, and multifactorial anemia (CLL and renal disease). She had a bone marrow biopsy many years back. She has had a long-standing anemia that is felt to be secondary to her chronic kidney disease. She is on Procrit 40,000 units monthly with stability of her anemia. Her daughter states they do not want to discontinue that, as her blood counts have been stable for some time. Additionally, she is not interested in additional evaluation of anemia. We will therefore continue with the plan as scheduled.  Labs monthly: CBC diff, ferritin.  Labs today: CBC diff, CMET, LDH, ESR  Labs in 4 months: CBC diff, CMET, LDH, ESR  Procrit given today.  Supportive therapy plan reviewed.  Return in 4 months for follow-up, sooner if needed.

## 2014-12-09 NOTE — Progress Notes (Signed)
Please see doctors encounter for more information 

## 2014-12-09 NOTE — Patient Instructions (Addendum)
[place] at [facility] Discharge Instructions  RECOMMENDATIONS MADE BY THE CONSULTANT AND ANY TEST RESULTS WILL BE SENT TO YOUR REFERRING PHYSICIAN.  Today your hemoglobin was 10.8.   You received your Epogen 40,000 units.  Return in 1 month for labs and injection.   Return in November for a follow up visit with Dr. Whitney Muse    Thank you for choosing [place] at [facility] to provide your oncology and hematology care.  To afford each patient quality time with our provider, please arrive at least 15 minutes before your scheduled appointment time.    You need to re-schedule your appointment should you arrive 10 or more minutes late.  We strive to give you quality time with our providers, and arriving late affects you and other patients whose appointments are after yours.  Also, if you no show three or more times for appointments you may be dismissed from the clinic at the providers discretion.     Again, thank you for choosing [facility].  Our hope is that these requests will decrease the amount of time that you wait before being seen by our physicians.       _____________________________________________________________  Should you have questions after your visit to [facility], please contact our office at [phone] between the hours of 8:30 a.m. and 4:30 p.m.  Voicemails left after 4:30 p.m. will not be returned until the following business day.  For prescription refill requests, have your pharmacy contact our office.

## 2014-12-09 NOTE — Progress Notes (Signed)
Anne [name] presents today for injection per MD orders. Procrit 40,000 units administered SQ in left Abdomen. Administration without incident. Patient tolerated well.

## 2014-12-28 ENCOUNTER — Other Ambulatory Visit (HOSPITAL_COMMUNITY): Payer: Self-pay

## 2014-12-30 ENCOUNTER — Telehealth: Payer: Self-pay | Admitting: Internal Medicine

## 2014-12-30 NOTE — Telephone Encounter (Signed)
The congestive heart failure clinic along Anne [name] on the phone were read her cardiac History and ejection fraction to help her decide if she should enroll in the CHF clinic    HYPERTENSION  Ischemic cardiomyopathy  Chronic systolic dysfunction of left ventricle  Coronary artery disease        LV EF: 55% -  60%

## 2014-12-30 NOTE — Telephone Encounter (Signed)
Almyra Free needs to confirm the pt's diagnosis code and get her most recent Ejections Fraction for the heart failure program. Please assist.   Thanks

## 2015-01-06 ENCOUNTER — Ambulatory Visit (HOSPITAL_COMMUNITY): Payer: Medicare Other

## 2015-01-06 ENCOUNTER — Other Ambulatory Visit (HOSPITAL_COMMUNITY): Payer: Medicare Other

## 2015-01-07 ENCOUNTER — Encounter (HOSPITAL_COMMUNITY): Payer: Medicare Other

## 2015-01-07 ENCOUNTER — Encounter (HOSPITAL_COMMUNITY): Payer: Medicare Other | Attending: Oncology

## 2015-01-07 ENCOUNTER — Encounter (HOSPITAL_COMMUNITY): Payer: Self-pay

## 2015-01-07 VITALS — BP 133/51 | HR 71 | Temp 98.2°F | Resp 18

## 2015-01-07 DIAGNOSIS — N189 Chronic kidney disease, unspecified: Secondary | ICD-10-CM

## 2015-01-07 DIAGNOSIS — D638 Anemia in other chronic diseases classified elsewhere: Secondary | ICD-10-CM | POA: Diagnosis present

## 2015-01-07 DIAGNOSIS — C9110 Chronic lymphocytic leukemia of B-cell type not having achieved remission: Secondary | ICD-10-CM | POA: Insufficient documentation

## 2015-01-07 DIAGNOSIS — N183 Chronic kidney disease, stage 3 unspecified: Secondary | ICD-10-CM

## 2015-01-07 DIAGNOSIS — D631 Anemia in chronic kidney disease: Secondary | ICD-10-CM

## 2015-01-07 LAB — CBC WITH DIFFERENTIAL/PLATELET
Basophils Absolute: 0 10*3/uL (ref 0.0–0.1)
Basophils Relative: 0 % (ref 0–1)
EOS PCT: 1 % (ref 0–5)
Eosinophils Absolute: 0.2 10*3/uL (ref 0.0–0.7)
HCT: 34.7 % — ABNORMAL LOW (ref 36.0–46.0)
Hemoglobin: 11 g/dL — ABNORMAL LOW (ref 12.0–15.0)
LYMPHS ABS: 12.7 10*3/uL — AB (ref 0.7–4.0)
Lymphocytes Relative: 79 % — ABNORMAL HIGH (ref 12–46)
MCH: 29.7 pg (ref 26.0–34.0)
MCHC: 31.7 g/dL (ref 30.0–36.0)
MCV: 93.8 fL (ref 78.0–100.0)
MONO ABS: 0.3 10*3/uL (ref 0.1–1.0)
Monocytes Relative: 2 % — ABNORMAL LOW (ref 3–12)
NEUTROS ABS: 3 10*3/uL (ref 1.7–7.7)
Neutrophils Relative %: 18 % — ABNORMAL LOW (ref 43–77)
Platelets: 198 10*3/uL (ref 150–400)
RBC: 3.7 MIL/uL — ABNORMAL LOW (ref 3.87–5.11)
RDW: 14.9 % (ref 11.5–15.5)
WBC: 16.2 10*3/uL — ABNORMAL HIGH (ref 4.0–10.5)

## 2015-01-07 LAB — FERRITIN: Ferritin: 123 ng/mL (ref 11–307)

## 2015-01-07 MED ORDER — EPOETIN ALFA 40000 UNIT/ML IJ SOLN
INTRAMUSCULAR | Status: AC
  Filled 2015-01-07: qty 1

## 2015-01-07 MED ORDER — EPOETIN ALFA 40000 UNIT/ML IJ SOLN
40000.0000 [IU] | Freq: Once | INTRAMUSCULAR | Status: AC
  Administered 2015-01-07: 40000 [IU] via SUBCUTANEOUS

## 2015-01-07 NOTE — Patient Instructions (Signed)
[place] at [facility] Discharge Instructions  RECOMMENDATIONS MADE BY THE CONSULTANT AND ANY TEST RESULTS WILL BE SENT TO YOUR REFERRING PHYSICIAN.  Hemoglobin 11.0 today. Procrit 40,000 units injection given as ordered. Return as scheduled.  Thank you for choosing [place] at [facility] to provide your oncology and hematology care.  To afford each patient quality time with our provider, please arrive at least 15 minutes before your scheduled appointment time.    You need to re-schedule your appointment should you arrive 10 or more minutes late.  We strive to give you quality time with our providers, and arriving late affects you and other patients whose appointments are after yours.  Also, if you no show three or more times for appointments you may be dismissed from the clinic at the providers discretion.     Again, thank you for choosing [facility].  Our hope is that these requests will decrease the amount of time that you wait before being seen by our physicians.       _____________________________________________________________  Should you have questions after your visit to [facility], please contact our office at [phone] between the hours of 8:30 a.m. and 4:30 p.m.  Voicemails left after 4:30 p.m. will not be returned until the following business day.  For prescription refill requests, have your pharmacy contact our office.

## 2015-01-07 NOTE — Progress Notes (Signed)
Anne [name]'s reason for visit today is for an injection and labs as scheduled per MD orders.  Labs were drawn prior to administration of ordered medication.   [place] also received procrit 40,000 units per MD orders; see [facility] for administration details.  Anne [name] tolerated all procedures well and without incident; questions were answered and patient was discharged.

## 2015-02-04 ENCOUNTER — Encounter (HOSPITAL_COMMUNITY): Payer: Medicare Other | Attending: Oncology

## 2015-02-04 ENCOUNTER — Encounter (HOSPITAL_BASED_OUTPATIENT_CLINIC_OR_DEPARTMENT_OTHER): Payer: Medicare Other

## 2015-02-04 DIAGNOSIS — D638 Anemia in other chronic diseases classified elsewhere: Secondary | ICD-10-CM | POA: Insufficient documentation

## 2015-02-04 DIAGNOSIS — C9110 Chronic lymphocytic leukemia of B-cell type not having achieved remission: Secondary | ICD-10-CM | POA: Insufficient documentation

## 2015-02-04 LAB — CBC
HCT: 34.9 % — ABNORMAL LOW (ref 36.0–46.0)
Hemoglobin: 11.3 g/dL — ABNORMAL LOW (ref 12.0–15.0)
MCH: 30.7 pg (ref 26.0–34.0)
MCHC: 32.4 g/dL (ref 30.0–36.0)
MCV: 94.8 fL (ref 78.0–100.0)
Platelets: 172 10*3/uL (ref 150–400)
RBC: 3.68 MIL/uL — AB (ref 3.87–5.11)
RDW: 14.3 % (ref 11.5–15.5)
WBC: 13.4 10*3/uL — ABNORMAL HIGH (ref 4.0–10.5)

## 2015-02-04 NOTE — Progress Notes (Signed)
LABS DRAWN

## 2015-02-04 NOTE — Progress Notes (Signed)
Patient didn't need injection today r/t lab results.  Patient aware and understands to come back to the clinic as scheduled.

## 2015-02-18 ENCOUNTER — Telehealth: Payer: Self-pay | Admitting: Internal Medicine

## 2015-02-18 NOTE — Telephone Encounter (Signed)
LM informing EC that the patient's Merlin will transmit automatically on 9/26 and if it is not received then she will get a call. I encouraged her to call back with further questions.

## 2015-02-18 NOTE — Telephone Encounter (Signed)
New message     Pt is due to have a remote transmission check on Monday.  Please call and refresh their memory on how to do this

## 2015-02-21 ENCOUNTER — Encounter: Payer: Self-pay | Admitting: Internal Medicine

## 2015-02-21 ENCOUNTER — Ambulatory Visit (INDEPENDENT_AMBULATORY_CARE_PROVIDER_SITE_OTHER): Payer: Medicare Other | Admitting: *Deleted

## 2015-02-21 DIAGNOSIS — I255 Ischemic cardiomyopathy: Secondary | ICD-10-CM

## 2015-02-22 LAB — CUP PACEART REMOTE DEVICE CHECK
Battery Voltage: 2.93 V
Brady Statistic AP VP Percent: 1.2 %
Brady Statistic AP VS Percent: 1 %
Brady Statistic AS VP Percent: 98 %
Lead Channel Impedance Value: 560 Ohm
Lead Channel Impedance Value: 930 Ohm
Lead Channel Pacing Threshold Amplitude: 1.375 V
Lead Channel Pacing Threshold Pulse Width: 0.4 ms
Lead Channel Pacing Threshold Pulse Width: 0.4 ms
Lead Channel Sensing Intrinsic Amplitude: 9.9 mV
Lead Channel Setting Pacing Amplitude: 2 V
Lead Channel Setting Pacing Amplitude: 2.125
Lead Channel Setting Pacing Pulse Width: 0.4 ms
Lead Channel Setting Sensing Sensitivity: 2 mV
MDC IDC MSMT BATTERY REMAINING LONGEVITY: 86 mo
MDC IDC MSMT BATTERY REMAINING PERCENTAGE: 81 %
MDC IDC MSMT LEADCHNL LV PACING THRESHOLD AMPLITUDE: 1.125 V
MDC IDC MSMT LEADCHNL RA IMPEDANCE VALUE: 440 Ohm
MDC IDC MSMT LEADCHNL RA PACING THRESHOLD PULSEWIDTH: 0.4 ms
MDC IDC MSMT LEADCHNL RA SENSING INTR AMPL: 1.8 mV
MDC IDC MSMT LEADCHNL RV PACING THRESHOLD AMPLITUDE: 1 V
MDC IDC SESS DTM: 20160926084824
MDC IDC SET LEADCHNL RA PACING AMPLITUDE: 2.375
MDC IDC SET LEADCHNL RV PACING PULSEWIDTH: 0.4 ms
MDC IDC STAT BRADY AS VS PERCENT: 1 %
MDC IDC STAT BRADY RA PERCENT PACED: 1.6 %
Pulse Gen Serial Number: 2707573

## 2015-02-22 NOTE — Progress Notes (Signed)
Remote pacemaker transmission.   

## 2015-02-23 ENCOUNTER — Ambulatory Visit (INDEPENDENT_AMBULATORY_CARE_PROVIDER_SITE_OTHER): Payer: Medicare Other | Admitting: Internal Medicine

## 2015-02-23 ENCOUNTER — Encounter: Payer: Self-pay | Admitting: Internal Medicine

## 2015-02-23 VITALS — BP 128/56 | HR 80 | Ht 63.0 in | Wt 153.7 lb

## 2015-02-23 DIAGNOSIS — I429 Cardiomyopathy, unspecified: Secondary | ICD-10-CM

## 2015-02-23 DIAGNOSIS — I428 Other cardiomyopathies: Secondary | ICD-10-CM

## 2015-02-23 DIAGNOSIS — Z951 Presence of aortocoronary bypass graft: Secondary | ICD-10-CM

## 2015-02-23 DIAGNOSIS — I255 Ischemic cardiomyopathy: Secondary | ICD-10-CM

## 2015-02-23 DIAGNOSIS — I10 Essential (primary) hypertension: Secondary | ICD-10-CM

## 2015-02-23 DIAGNOSIS — Z950 Presence of cardiac pacemaker: Secondary | ICD-10-CM

## 2015-02-23 DIAGNOSIS — E785 Hyperlipidemia, unspecified: Secondary | ICD-10-CM

## 2015-02-23 NOTE — Progress Notes (Signed)
OFFICE NOTE  Chief Complaint:  Routine follow-up, occasional shortness of breath  Primary Care Physician: Wende Neighbors, MD  HPI:  Anne [name] is an 81 year old female with a history of ischemic cardiomyopathy, EF of 25-30% and marked intraventricular conduction delay. She had a BiV pacemaker placed by Dr. Rayann Heman in March and recently had that adjusted. She had an echocardiogram on April 07, 2012. This showed borderline dilated left ventricle with an EF of 35%. There is still moderate intraventricular dyssynchrony, however, improved from previous studies. The left atrium is mildly dilated. There is moderate MR and moderate to severe TR with an RVSP of 31.   Anne [name] returns today for followup. She denies any worsening shortness of breath. She scheduled to see Dr. Rayann Heman in December. Her EF has been at 35% as of November of 2013. She is due for reassessment with echo. Her weight is stable. She denies any chest pain.  I saw Anne [name] back in the office today. She seems to be doing fairly well although is struggling with some mild dementia. She's had some difficulty remembering things and apparently eats certain things and then may continue to snack thinking that she hadn't eaten before. This might be evident as she's had about 10 pound weight gain. She denies any worsening shortness of breath, orthopnea, PND or lower extremity swelling. As mentioned her EF has actually improved back to 55% by echo in September 2015. She last saw Dr. Rayann Heman in December 2015 and was doing well. Her remote checks of the CRT-P device have continued to look good. She does get some mild dyspnea on exertion but is generally pretty stable.  PMHx:  Past Medical History  Diagnosis Date  . CLL (chronic lymphocytic leukemia)     stable  . Anemia   . DM (diabetes mellitus)   . Hypercholesteremia   . DJD (degenerative joint disease) of knee     bilat  . Hypertension   . Shortness of breath   . CHF  (congestive heart failure)     [place] Class 3 Heart Failure  . Blood transfusion   . CLL (chronic lymphocytic leukemia)   . CLL (chronic lymphocytic leukemia) 04/03/2007    Stage 0 CLL    . Anemia of chronic disease 10/30/2011  . Ischemic cardiomyopathy     EF of 35% on 2D Echo done 04/07/12 moderate intraventricular dyssynchrony, moderate MR and moderate to severe TR with an RVSP of 31  . Coronary artery disease     three-vessel CAD with CABG -- x5 -- left internal mammary artery to LAD, sequential saphenous vein graft to first diagonal and first obtuse marginal, sequential saphenous vein graft to posterior descending and obtuse marginal 2 (posterolateral), endoscopic vein harvest of righ leg  . Anemia of chronic renal failure, stage 3 (moderate) 10/30/2011    Past Surgical History  Procedure Laterality Date  . Coronary artery bypass graft  06/22/2009    x5 -- left internal mammary artery to LAD, sequential saphenous vein graft to first diagonal and first obtuse marginal, sequential saphenous vein graft to posterior descending and obtuse marginal 2 (posterolateral), endoscopic vein harvest of righ leg  . Goiter resection    . Thyroidectomy  1970s  . Cataract extraction, bilateral    . Rectal abscess    . Replacement total knee bilateral      bilateral  . Cardiac catheterization  01/18/2011    Est. EF of 25% to 30 - Mildly reduced cardiac output with no signs  of decompensated heart failure and normal right heart filling pressures -- Patent bypass grafts with underlying three-vessel coronary artery disease  . Cardiac catheterization  06/14/2009    Est. EF of of 45% - Multivessel coronary artery disease with high-grade lesions in the LAD, left circumflex, and RCA -- Mildly reduced left ventricular function -- No significant aortic stenosis or mitral regurgitation  . Pacemaker insertion  04/16/2011    biventricular pacemaker insertion  . Eye surgery    . Steroid injections to sacral spine 01/28/13 -  dr Brien Few  01/28/13  . Skin graft  09/2013    nose  . Bi-ventricular pacemaker insertion N/A 04/16/2011    Procedure: BI-VENTRICULAR PACEMAKER INSERTION (CRT-P);  Surgeon: Thompson Grayer, MD;  Location: [facility] CATH LAB;  Service: Cardiovascular;  Laterality: N/A;    FAMHx:  Family History  Problem Relation Age of Onset  . Heart failure Brother 82  . Heart attack Brother 52  . Heart disease Son 67    Had CABG x 1  . Hypertension Son     SOCHx:   reports that she has never smoked. She has never used smokeless tobacco. She reports that she does not drink alcohol or use illicit drugs.  ALLERGIES:  Allergies  Allergen Reactions  . Lisinopril     REACTION: Dry Cough    ROS: A comprehensive review of systems was negative except for: Respiratory: positive for dyspnea on exertion  HOME MEDS: Current Outpatient Prescriptions  Medication Sig Dispense Refill  . aspirin 81 MG EC tablet Take 81 mg by mouth daily.     . carvedilol (COREG) 6.25 MG tablet TAKE 1 TABLET BY MOUTH TWICE DAILY WITH A MEAL 180 tablet 3  . citalopram (CELEXA) 10 MG tablet Take 10 mg by mouth 2 (two) times daily.    Marland Kitchen DIOVAN 160 MG tablet Take 80-160 mg by mouth daily. Take and additional 1/2 tab on M, W, F nights    . Docusate Calcium (STOOL SOFTENER PO) Take 1 capsule by mouth daily.     Marland Kitchen donepezil (ARICEPT) 5 MG tablet Take 1 tablet by mouth at bedtime.   5  . fish oil-omega-3 fatty acids 1000 MG capsule Take 1 g by mouth daily.     . furosemide (LASIX) 20 MG tablet Take 20 tablets by mouth every morning.   0  . HUMALOG KWIKPEN 100 UNIT/ML KiwkPen Inject 0-12 Units into the skin every evening. Based on Sliding Scale.  5  . HYDROcodone-acetaminophen (NORCO/VICODIN) 5-325 MG per tablet Take 1 tablet by mouth 3 (three) times daily.  0  . IRON CR PO Take 1 tablet by mouth every Monday, Wednesday, and Friday.     Marland Kitchen LEVEMIR FLEXTOUCH 100 UNIT/ML Pen Inject 25 Units into the skin every morning.   5  . LORazepam (ATIVAN) 0.5  MG tablet Take 0.5 mg by mouth every 8 (eight) hours as needed. FOR NERVES    . metFORMIN (GLUCOPHAGE) 1000 MG tablet Take 1,000 mg by mouth 2 (two) times daily with a meal.     . Multiple Vitamins-Minerals (CENTRUM SILVER PO) Take 1 tablet by mouth daily.     Marland Kitchen NAMENDA 10 MG tablet Take 10 mg by mouth 2 (two) times daily.     . ondansetron (ZOFRAN ODT) 4 MG disintegrating tablet 4mg  ODT q4 hours prn nausea/vomit 12 tablet 0  . pravastatin (PRAVACHOL) 40 MG tablet Take 1 tablet (40 mg total) by mouth daily. 90 tablet 2  . TRADJENTA 5 MG TABS  tablet Take 1 tablet by mouth daily.  5  . traMADol (ULTRAM) 50 MG tablet Take 50 mg by mouth daily as needed for moderate pain or severe pain.      No current facility-administered medications for this visit.    LABS/IMAGING: No results found for this or any previous visit (from the past 48 hour(s)). No results found.  VITALS: BP 128/56 mmHg  Pulse 80  Ht 5\' 3"  (1.6 m)  Wt 153 lb 11.2 oz (69.718 kg)  BMI 27.23 kg/m2  EXAM: General appearance: alert and no distress Neck: no adenopathy, no carotid bruit, no JVD, supple, symmetrical, trachea midline and thyroid not enlarged, symmetric, no tenderness/mass/nodules Lungs: clear to auscultation bilaterally Heart: regular rate and rhythm, S1, S2 normal, no murmur, click, rub or gallop Abdomen: soft, non-tender; bowel sounds normal; no masses,  no organomegaly Extremities: extremities normal, atraumatic, no cyanosis or edema Pulses: 2+ and symmetric Skin: Skin color, texture, turgor normal. No rashes or lesions Neurologic: Grossly normal  EKG: Normal sinus rhythm with sinus arrhythmia at 80, interventricular conduction delay, T-wave changes across the precordium  ASSESSMENT: 1. Ischemic cardiomyopathy, EF 35% -> improved to 55% (04/2014), status post biventricular pacing with New York Heart Association class II symptoms 2.   Hypertension 3.   Dyslipidemia 4.   Coronary artery disease status post  CABG x5 in 2011 5.   Low back pain 6.   Diabetes type 2-recently started on insulin  PLAN: 1.   Ms. [name] has fortunately had an improvement in her LV function up to normal. Her biventricular pacing device appears to be working properly and she is a Doctor, hospital. Blood pressure is well-controlled today. Her weight is up 10 pounds however there are no signs or symptoms to suggest congestive heart failure. I suspect that she simply been eating more. I asked her to watch her sugar intake. She has had a rising A1c which would be consistent with this. She also had one hypoglycemic episode where she forgot to eat. Overall she seems to be doing fairly well we'll plan to see her back in a year with an echocardiogram again at that time.  Pixie Casino, MD, [facility] Attending Cardiologist [place] 02/23/2015, 12:52 PM

## 2015-02-23 NOTE — Patient Instructions (Addendum)
Your physician has requested that you have an echocardiogram in 1 year (prior to your office visit with Dr. Debara Pickett). This is done at [address] [facility]. Echocardiography is a painless test that uses sound waves to create images of your heart. It provides your doctor with information about the size and shape of your heart and how well your heart's chambers and valves are working. This procedure takes approximately one hour. There are no restrictions for this procedure.  Your physician wants you to follow-up in: 1 year with Dr. Debara Pickett. You will receive a reminder letter in the mail two months in advance. If you don't receive a letter, please call our office to schedule the follow-up appointment.

## 2015-03-03 ENCOUNTER — Other Ambulatory Visit (HOSPITAL_COMMUNITY): Payer: Self-pay

## 2015-03-04 ENCOUNTER — Encounter (HOSPITAL_COMMUNITY): Payer: Medicare Other

## 2015-03-04 ENCOUNTER — Other Ambulatory Visit (HOSPITAL_COMMUNITY): Payer: Medicare Other

## 2015-03-07 ENCOUNTER — Encounter (HOSPITAL_COMMUNITY): Payer: Medicare Other

## 2015-03-07 ENCOUNTER — Other Ambulatory Visit (HOSPITAL_COMMUNITY): Payer: Medicare Other

## 2015-03-08 ENCOUNTER — Encounter: Payer: Self-pay | Admitting: Cardiology

## 2015-03-10 ENCOUNTER — Encounter (HOSPITAL_BASED_OUTPATIENT_CLINIC_OR_DEPARTMENT_OTHER): Payer: Medicare Other

## 2015-03-10 ENCOUNTER — Encounter: Payer: Self-pay | Admitting: Internal Medicine

## 2015-03-10 ENCOUNTER — Encounter (HOSPITAL_COMMUNITY): Payer: Self-pay

## 2015-03-10 ENCOUNTER — Encounter (HOSPITAL_COMMUNITY): Payer: Medicare Other | Attending: Oncology

## 2015-03-10 VITALS — BP 117/65 | HR 82 | Temp 98.4°F | Resp 16

## 2015-03-10 DIAGNOSIS — N183 Chronic kidney disease, stage 3 unspecified: Secondary | ICD-10-CM

## 2015-03-10 DIAGNOSIS — C9110 Chronic lymphocytic leukemia of B-cell type not having achieved remission: Secondary | ICD-10-CM | POA: Insufficient documentation

## 2015-03-10 DIAGNOSIS — N189 Chronic kidney disease, unspecified: Secondary | ICD-10-CM

## 2015-03-10 DIAGNOSIS — D631 Anemia in chronic kidney disease: Secondary | ICD-10-CM

## 2015-03-10 DIAGNOSIS — D638 Anemia in other chronic diseases classified elsewhere: Secondary | ICD-10-CM | POA: Diagnosis present

## 2015-03-10 LAB — CBC WITH DIFFERENTIAL/PLATELET
BASOS ABS: 0 10*3/uL (ref 0.0–0.1)
BASOS PCT: 0 %
Eosinophils Absolute: 0.1 10*3/uL (ref 0.0–0.7)
Eosinophils Relative: 1 %
HEMATOCRIT: 32.2 % — AB (ref 36.0–46.0)
HEMOGLOBIN: 10.4 g/dL — AB (ref 12.0–15.0)
LYMPHS PCT: 81 %
Lymphs Abs: 14.5 10*3/uL — ABNORMAL HIGH (ref 0.7–4.0)
MCH: 29.7 pg (ref 26.0–34.0)
MCHC: 32.3 g/dL (ref 30.0–36.0)
MCV: 92 fL (ref 78.0–100.0)
MONO ABS: 0.1 10*3/uL (ref 0.1–1.0)
Monocytes Relative: 0 %
NEUTROS ABS: 3.2 10*3/uL (ref 1.7–7.7)
NEUTROS PCT: 18 %
Platelets: 201 10*3/uL (ref 150–400)
RBC: 3.5 MIL/uL — AB (ref 3.87–5.11)
RDW: 14.3 % (ref 11.5–15.5)
WBC: 18 10*3/uL — ABNORMAL HIGH (ref 4.0–10.5)

## 2015-03-10 LAB — FERRITIN: FERRITIN: 175 ng/mL (ref 11–307)

## 2015-03-10 MED ORDER — EPOETIN ALFA 40000 UNIT/ML IJ SOLN
40000.0000 [IU] | Freq: Once | INTRAMUSCULAR | Status: AC
  Administered 2015-03-10: 40000 [IU] via SUBCUTANEOUS

## 2015-03-10 MED ORDER — EPOETIN ALFA 40000 UNIT/ML IJ SOLN
INTRAMUSCULAR | Status: AC
  Filled 2015-03-10: qty 1

## 2015-03-10 NOTE — Progress Notes (Signed)
Anne [name] received Procrit 40,000 units per MD orders; see [facility] for administration details.  Anne [name] tolerated procedure well and without incident; questions were answered and patient was discharged.

## 2015-03-10 NOTE — Patient Instructions (Signed)
[place] at [facility] Discharge Instructions  RECOMMENDATIONS MADE BY THE CONSULTANT AND ANY TEST RESULTS WILL BE SENT TO YOUR REFERRING PHYSICIAN.  Procrit injection today. Return as scheduled for lab work and injections.  Return as scheduled for office visit.    Thank you for choosing [place] at [facility] to provide your oncology and hematology care.  To afford each patient quality time with our provider, please arrive at least 15 minutes before your scheduled appointment time.    You need to re-schedule your appointment should you arrive 10 or more minutes late.  We strive to give you quality time with our providers, and arriving late affects you and other patients whose appointments are after yours.  Also, if you no show three or more times for appointments you may be dismissed from the clinic at the providers discretion.     Again, thank you for choosing [facility].  Our hope is that these requests will decrease the amount of time that you wait before being seen by our physicians.       _____________________________________________________________  Should you have questions after your visit to [facility], please contact our office at [phone] between the hours of 8:30 a.m. and 4:30 p.m.  Voicemails left after 4:30 p.m. will not be returned until the following business day.  For prescription refill requests, have your pharmacy contact our office.

## 2015-03-11 NOTE — Progress Notes (Signed)
Labs drawn

## 2015-03-17 ENCOUNTER — Encounter: Payer: Self-pay | Admitting: Internal Medicine

## 2015-04-01 ENCOUNTER — Encounter (HOSPITAL_COMMUNITY): Payer: Medicare Other | Attending: Oncology

## 2015-04-01 ENCOUNTER — Encounter (HOSPITAL_BASED_OUTPATIENT_CLINIC_OR_DEPARTMENT_OTHER): Payer: Medicare Other | Admitting: Oncology

## 2015-04-01 ENCOUNTER — Encounter (HOSPITAL_BASED_OUTPATIENT_CLINIC_OR_DEPARTMENT_OTHER): Payer: Medicare Other

## 2015-04-01 ENCOUNTER — Ambulatory Visit (HOSPITAL_COMMUNITY): Payer: Medicare Other | Admitting: Hematology & Oncology

## 2015-04-01 VITALS — BP 121/67 | HR 88 | Temp 98.6°F | Resp 16 | Wt 154.0 lb

## 2015-04-01 DIAGNOSIS — D649 Anemia, unspecified: Secondary | ICD-10-CM | POA: Diagnosis not present

## 2015-04-01 DIAGNOSIS — N183 Chronic kidney disease, stage 3 unspecified: Secondary | ICD-10-CM

## 2015-04-01 DIAGNOSIS — R112 Nausea with vomiting, unspecified: Secondary | ICD-10-CM | POA: Diagnosis not present

## 2015-04-01 DIAGNOSIS — N289 Disorder of kidney and ureter, unspecified: Secondary | ICD-10-CM | POA: Diagnosis not present

## 2015-04-01 DIAGNOSIS — D631 Anemia in chronic kidney disease: Secondary | ICD-10-CM

## 2015-04-01 DIAGNOSIS — D638 Anemia in other chronic diseases classified elsewhere: Secondary | ICD-10-CM

## 2015-04-01 DIAGNOSIS — C9110 Chronic lymphocytic leukemia of B-cell type not having achieved remission: Secondary | ICD-10-CM

## 2015-04-01 LAB — LACTATE DEHYDROGENASE: LDH: 181 U/L (ref 98–192)

## 2015-04-01 LAB — COMPREHENSIVE METABOLIC PANEL
ALBUMIN: 3.7 g/dL (ref 3.5–5.0)
ALT: 12 U/L — AB (ref 14–54)
AST: 21 U/L (ref 15–41)
Alkaline Phosphatase: 52 U/L (ref 38–126)
Anion gap: 6 (ref 5–15)
BILIRUBIN TOTAL: 0.7 mg/dL (ref 0.3–1.2)
BUN: 30 mg/dL — AB (ref 6–20)
CHLORIDE: 107 mmol/L (ref 101–111)
CO2: 25 mmol/L (ref 22–32)
CREATININE: 1.15 mg/dL — AB (ref 0.44–1.00)
Calcium: 9.2 mg/dL (ref 8.9–10.3)
GFR calc Af Amer: 49 mL/min — ABNORMAL LOW (ref >60)
GFR calc non Af Amer: 42 mL/min — ABNORMAL LOW (ref >60)
GLUCOSE: 218 mg/dL — AB (ref 65–99)
POTASSIUM: 5 mmol/L (ref 3.5–5.1)
Sodium: 138 mmol/L (ref 135–145)
Total Protein: 6.5 g/dL (ref 6.5–8.1)

## 2015-04-01 LAB — CBC WITH DIFFERENTIAL/PLATELET
BASOS ABS: 0 10*3/uL (ref 0.0–0.1)
BASOS PCT: 0 %
EOS ABS: 0.1 10*3/uL (ref 0.0–0.7)
Eosinophils Relative: 1 %
HEMATOCRIT: 35 % — AB (ref 36.0–46.0)
Hemoglobin: 11 g/dL — ABNORMAL LOW (ref 12.0–15.0)
Lymphocytes Relative: 70 %
Lymphs Abs: 11.2 10*3/uL — ABNORMAL HIGH (ref 0.7–4.0)
MCH: 29.7 pg (ref 26.0–34.0)
MCHC: 31.4 g/dL (ref 30.0–36.0)
MCV: 94.6 fL (ref 78.0–100.0)
MONOS PCT: 2 %
Monocytes Absolute: 0.3 10*3/uL (ref 0.1–1.0)
NEUTROS PCT: 27 %
Neutro Abs: 4.3 10*3/uL (ref 1.7–7.7)
PLATELETS: 196 10*3/uL (ref 150–400)
RBC: 3.7 MIL/uL — AB (ref 3.87–5.11)
RDW: 15.5 % (ref 11.5–15.5)
WBC: 15.9 10*3/uL — AB (ref 4.0–10.5)

## 2015-04-01 LAB — SEDIMENTATION RATE: Sed Rate: 15 mm/hr (ref 0–22)

## 2015-04-01 LAB — FERRITIN: FERRITIN: 111 ng/mL (ref 11–307)

## 2015-04-01 MED ORDER — EPOETIN ALFA 40000 UNIT/ML IJ SOLN
40000.0000 [IU] | Freq: Once | INTRAMUSCULAR | Status: AC
  Administered 2015-04-01: 40000 [IU] via SUBCUTANEOUS
  Filled 2015-04-01: qty 1

## 2015-04-01 NOTE — Progress Notes (Signed)
Wende Neighbors, MD [place] [address]  CLL (chronic lymphocytic leukemia) [facility]) - Plan: CBC with Differential, Comprehensive metabolic panel, Lactate dehydrogenase, Sedimentation rate  CURRENT THERAPY: Procrit 40,000 since 12/2010, once monthly  INTERVAL HISTORY: CICILIA CLINGER 81 y.o. female returns for followup of CLL with an element of anemia that is likely multifactorial from anemia of chronic renal disease and/or CLL.    I personally reviewed and went over laboratory results with the patient.  The results are noted within this dictation.  Labs today are being updated and pending at this time.  She denies any B symptoms.  Her weight is stable and 154 lbs.  She denies any new lumps or bumps.  She denies any bowel and urinary complaints.  She is S/P influenza and pneumovax immunizations by Dr. Nevada Crane.  She denies any known concerns from Dr. Juel Burrow perspective.  She notes infrequent, episodic nausea with emesis.  The patient's daughter will be more vigilant for a pattern to this.  The patient denies any dizziness or preceding symptoms associated with her sudden onset of nausea with emesis.  She notes that following these episodes, her symptoms completely resolve.  There is some question regarding whether this is associated with car sickness maybe.  She does have some nausea medication at home.  I have asked her to follow-up with Dr. Nevada Crane regarding this.  GI evaluation may be indicated, but given the patient age and co-morbidities, aggressive GI work-up may not be indicated.  I will defer to Dr. Nevada Crane.  Past Medical History  Diagnosis Date  . CLL (chronic lymphocytic leukemia) (HCC)     stable  . Anemia   . DM (diabetes mellitus) ([place])   . Hypercholesteremia   . DJD (degenerative joint disease) of knee     bilat  . Hypertension   . Shortness of breath   . CHF (congestive heart failure) (HCC)     [place] Class 3 Heart Failure  . Blood transfusion   . CLL (chronic  lymphocytic leukemia) ([place])   . CLL (chronic lymphocytic leukemia) ([place]) 04/03/2007    Stage 0 CLL    . Anemia of chronic disease 10/30/2011  . Ischemic cardiomyopathy     EF of 35% on 2D Echo done 04/07/12 moderate intraventricular dyssynchrony, moderate MR and moderate to severe TR with an RVSP of 31  . Coronary artery disease     three-vessel CAD with CABG -- x5 -- left internal mammary artery to LAD, sequential saphenous vein graft to first diagonal and first obtuse marginal, sequential saphenous vein graft to posterior descending and obtuse marginal 2 (posterolateral), endoscopic vein harvest of righ leg  . Anemia of chronic renal failure, stage 3 (moderate) 10/30/2011    has CLL (chronic lymphocytic leukemia) ([place]); HYPOTHYROIDISM; DIABETES MELLITUS, TYPE II, UNCONTROLLED; Hyperlipidemia; DEPRESSION; CATARACTS; Essential hypertension; ARTHRITIS; LOW BACK PAIN, CHRONIC; BURSITIS, ACROMIOCLAVICULAR, LEFT; MEMORY LOSS; FECAL INCONTINENCE; Ischemic cardiomyopathy; Chronic systolic dysfunction of left ventricle; Anemia of chronic renal failure, stage 3 (moderate); Pacemaker-St.Jude; Coronary artery disease; and S/P CABG x 5 on her problem list.     is allergic to lisinopril.  Ms. Soderberg does not currently have medications on file.  Past Surgical History  Procedure Laterality Date  . Coronary artery bypass graft  06/22/2009    x5 -- left internal mammary artery to LAD, sequential saphenous vein graft to first diagonal and first obtuse marginal, sequential saphenous vein graft to posterior descending and obtuse marginal  2 (posterolateral), endoscopic vein harvest of righ leg  . Goiter resection    . Thyroidectomy  1970s  . Cataract extraction, bilateral    . Rectal abscess    . Replacement total knee bilateral      bilateral  . Cardiac catheterization  01/18/2011    Est. EF of 25% to 30 - Mildly reduced cardiac output with no signs of decompensated heart failure and normal right heart filling  pressures -- Patent bypass grafts with underlying three-vessel coronary artery disease  . Cardiac catheterization  06/14/2009    Est. EF of of 45% - Multivessel coronary artery disease with high-grade lesions in the LAD, left circumflex, and RCA -- Mildly reduced left ventricular function -- No significant aortic stenosis or mitral regurgitation  . Pacemaker insertion  04/16/2011    biventricular pacemaker insertion  . Eye surgery    . Steroid injections to sacral spine 01/28/13 - dr Brien Few  01/28/13  . Skin graft  09/2013    nose  . Bi-ventricular pacemaker insertion N/A 04/16/2011    Procedure: BI-VENTRICULAR PACEMAKER INSERTION (CRT-P);  Surgeon: Thompson Grayer, MD;  Location: [facility] CATH LAB;  Service: Cardiovascular;  Laterality: N/A;    Denies any headaches, dizziness, double vision, fevers, chills, night sweats, nausea, vomiting, diarrhea, constipation, chest pain, heart palpitations, shortness of breath, blood in stool, black tarry stool, urinary pain, urinary burning, urinary frequency, hematuria.   PHYSICAL EXAMINATION  ECOG PERFORMANCE STATUS: 1 - Symptomatic but completely ambulatory  Filed Vitals:   04/01/15 1006  BP: 121/67  Pulse: 88  Temp: 98.6 F (37 C)  Resp: 16    GENERAL:alert, no distress, well nourished, well developed, comfortable, cooperative, smiling and accompanied by her daughter. SKIN: skin color, texture, turgor are normal, no rashes or significant lesions HEAD: Normocephalic, No masses, lesions, tenderness or abnormalities EYES: normal, PERRLA, EOMI, Conjunctiva are pink and non-injected EARS: External ears normal OROPHARYNX:lips, buccal mucosa, and tongue normal and mucous membranes are moist, no erythema or thrush, no exudates.  Upper dentures noted. NECK: supple, no adenopathy, thyroid normal size, non-tender, without nodularity, trachea midline LYMPH:  no palpable lymphadenopathy, no hepatosplenomegaly BREAST:not examined LUNGS: clear to auscultation and  percussion HEART: regular rate & rhythm, no murmurs, no gallops, S1 normal and S2 normal ABDOMEN:abdomen soft, non-tender, normal bowel sounds and no masses or organomegaly BACK: Back symmetric, no curvature., No CVA tenderness EXTREMITIES:less then 2 second capillary refill, no joint deformities, effusion, or inflammation, no edema, no skin discoloration, no clubbing, no cyanosis  NEURO: alert & oriented x 3 with fluent speech, no focal motor/sensory deficits, gait normal   LABORATORY DATA: CBC    Component Value Date/Time   WBC 18.0* 03/10/2015 1058   RBC 3.50* 03/10/2015 1058   RBC 3.90 02/15/2014 1000   HGB 10.4* 03/10/2015 1058   HCT 32.2* 03/10/2015 1058   PLT 201 03/10/2015 1058   MCV 92.0 03/10/2015 1058   MCH 29.7 03/10/2015 1058   MCHC 32.3 03/10/2015 1058   RDW 14.3 03/10/2015 1058   LYMPHSABS 14.5* 03/10/2015 1058   MONOABS 0.1 03/10/2015 1058   EOSABS 0.1 03/10/2015 1058   BASOSABS 0.0 03/10/2015 1058      Chemistry      Component Value Date/Time   NA 138 12/09/2014 1208   K 5.2* 12/09/2014 1208   CL 104 12/09/2014 1208   CO2 25 12/09/2014 1208   BUN 28* 12/09/2014 1208   CREATININE 1.22* 12/09/2014 1208   CREATININE 1.07 04/10/2011 1027  Component Value Date/Time   CALCIUM 8.9 12/09/2014 1208   ALKPHOS 58 12/09/2014 1208   AST 21 12/09/2014 1208   ALT 15 12/09/2014 1208   BILITOT 0.7 12/09/2014 1208     Lab Results  Component Value Date   FERRITIN 175 03/10/2015     RADIOGRAPHIC STUDIES:  No results found.    ASSESSMENT AND PLAN:  CLL (chronic lymphocytic leukemia) 81 year old female with long-standing CLL, and multifactorial anemia (CLL and renal disease). She had a bone marrow biopsy many years back. She has had a long-standing anemia that is felt to be secondary to her chronic kidney disease. She is on Procrit 40,000 units monthly with stability of her anemia. Her daughter states they do not want to discontinue that, as her blood counts  have been stable for some time. Additionally, she is not interested in additional evaluation of anemia. We will therefore continue with the plan as scheduled.  Labs monthly: CBC diff, ferritin.  Labs today: CBC diff, CMET, LDH, ESR  Labs in 4 months: CBC diff, CMET, LDH, ESR  Procrit given today.  Supportive therapy plan reviewed.  Episodic nausea with vomiting reported.  I will defer to primary care provider.  She does have anti-emetic at home, but due to sudden onset and episodic symptoms, it is difficult to abort this issue with antiemetic.  Patient's daughter will be more vigilant for trend of these symptoms and will report to Dr. Nevada Crane for further evaluation and work-up.    She denies any B symptoms.  Weight is stable.  She lives at home with her son who has been very helpful in her care.  Return in 4 months for follow-up, sooner if needed.    THERAPY PLAN:  Continue with supportive therapy as planned.  All questions were answered. The patient knows to call the clinic with any problems, questions or concerns. We can certainly see the patient much sooner if necessary.  Patient and plan discussed with Dr. Ancil Linsey and she is in agreement with the aforementioned.   This note is electronically signed by: Robynn Pane 04/01/2015 10:40 AM

## 2015-04-01 NOTE — Addendum Note (Signed)
Addended by: Mellissa Kohut on: 04/01/2015 11:09 AM   Modules accepted: Orders

## 2015-04-01 NOTE — Progress Notes (Signed)
LABS DRAWN

## 2015-04-01 NOTE — Patient Instructions (Signed)
[place] at [facility] Discharge Instructions  RECOMMENDATIONS MADE BY THE CONSULTANT AND ANY TEST RESULTS WILL BE SENT TO YOUR REFERRING PHYSICIAN.  Exam and discussion by Robynn Pane, PA-C If hemoglobin is 11 grams or less will give procrit. Call with increased shortness of breath or increased fatigue.  Follow-up: Will continue with monthly lab work Procrit monthly Office visit in 4 months.  Thank you for choosing [place] at [facility] to provide your oncology and hematology care.  To afford each patient quality time with our provider, please arrive at least 15 minutes before your scheduled appointment time.    You need to re-schedule your appointment should you arrive 10 or more minutes late.  We strive to give you quality time with our providers, and arriving late affects you and other patients whose appointments are after yours.  Also, if you no show three or more times for appointments you may be dismissed from the clinic at the providers discretion.     Again, thank you for choosing [facility].  Our hope is that these requests will decrease the amount of time that you wait before being seen by our physicians.       _____________________________________________________________  Should you have questions after your visit to [facility], please contact our office at [phone] between the hours of 8:30 a.m. and 4:30 p.m.  Voicemails left after 4:30 p.m. will not be returned until the following business day.  For prescription refill requests, have your pharmacy contact our office.

## 2015-04-01 NOTE — Progress Notes (Signed)
Anne [name] presents today for injection per MD orders. Procrit 40,000 units administered SQ in left Abdomen. Administration without incident. Patient tolerated well.  

## 2015-04-01 NOTE — Assessment & Plan Note (Addendum)
81 year old female with long-standing CLL, and multifactorial anemia (CLL and renal disease). She had a bone marrow biopsy many years back. She has had a long-standing anemia that is felt to be secondary to her chronic kidney disease. She is on Procrit 40,000 units monthly with stability of her anemia. Her daughter states they do not want to discontinue that, as her blood counts have been stable for some time. Additionally, she is not interested in additional evaluation of anemia. We will therefore continue with the plan as scheduled.  Labs monthly: CBC diff, ferritin.  Labs today: CBC diff, CMET, LDH, ESR  Labs in 4 months: CBC diff, CMET, LDH, ESR  Procrit given today.  Supportive therapy plan reviewed.  Episodic nausea with vomiting reported.  I will defer to primary care provider.  She does have anti-emetic at home, but due to sudden onset and episodic symptoms, it is difficult to abort this issue with antiemetic.  Patient's daughter will be more vigilant for trend of these symptoms and will report to Dr. Nevada Crane for further evaluation and work-up.    She denies any B symptoms.  Weight is stable.  She lives at home with her son who has been very helpful in her care.  Return in 4 months for follow-up, sooner if needed.

## 2015-04-01 NOTE — Progress Notes (Signed)
Please see doctors encounter for more inforamtion 

## 2015-05-05 ENCOUNTER — Other Ambulatory Visit (HOSPITAL_COMMUNITY): Payer: Medicare Other

## 2015-05-05 ENCOUNTER — Encounter (HOSPITAL_COMMUNITY): Payer: Medicare Other

## 2015-05-09 ENCOUNTER — Encounter (HOSPITAL_COMMUNITY): Payer: Medicare Other

## 2015-05-09 ENCOUNTER — Other Ambulatory Visit (HOSPITAL_COMMUNITY): Payer: Medicare Other

## 2015-05-12 ENCOUNTER — Encounter (HOSPITAL_BASED_OUTPATIENT_CLINIC_OR_DEPARTMENT_OTHER): Payer: Medicare Other

## 2015-05-12 ENCOUNTER — Encounter (HOSPITAL_COMMUNITY): Payer: Medicare Other | Attending: Oncology

## 2015-05-12 DIAGNOSIS — N183 Chronic kidney disease, stage 3 unspecified: Secondary | ICD-10-CM

## 2015-05-12 DIAGNOSIS — D631 Anemia in chronic kidney disease: Secondary | ICD-10-CM | POA: Diagnosis not present

## 2015-05-12 DIAGNOSIS — D638 Anemia in other chronic diseases classified elsewhere: Secondary | ICD-10-CM | POA: Diagnosis present

## 2015-05-12 DIAGNOSIS — C9110 Chronic lymphocytic leukemia of B-cell type not having achieved remission: Secondary | ICD-10-CM | POA: Insufficient documentation

## 2015-05-12 LAB — CBC WITH DIFFERENTIAL/PLATELET
Basophils Absolute: 0 10*3/uL (ref 0.0–0.1)
Basophils Relative: 0 %
EOS ABS: 0.1 10*3/uL (ref 0.0–0.7)
Eosinophils Relative: 1 %
HCT: 36 % (ref 36.0–46.0)
Hemoglobin: 11.7 g/dL — ABNORMAL LOW (ref 12.0–15.0)
LYMPHS ABS: 12.2 10*3/uL — AB (ref 0.7–4.0)
Lymphocytes Relative: 79 %
MCH: 30.4 pg (ref 26.0–34.0)
MCHC: 32.5 g/dL (ref 30.0–36.0)
MCV: 93.5 fL (ref 78.0–100.0)
Monocytes Absolute: 0.2 10*3/uL (ref 0.1–1.0)
Monocytes Relative: 1 %
NEUTROS PCT: 19 %
Neutro Abs: 2.9 10*3/uL (ref 1.7–7.7)
PLATELETS: 165 10*3/uL (ref 150–400)
RBC: 3.85 MIL/uL — AB (ref 3.87–5.11)
RDW: 14.5 % (ref 11.5–15.5)
WBC: 15.5 10*3/uL — AB (ref 4.0–10.5)

## 2015-05-12 LAB — FERRITIN: Ferritin: 168 ng/mL (ref 11–307)

## 2015-05-12 NOTE — Progress Notes (Signed)
LABS DRAWN

## 2015-05-12 NOTE — Patient Instructions (Signed)
[facility] at [facility] Discharge Instructions  RECOMMENDATIONS MADE BY THE CONSULTANT AND ANY TEST RESULTS WILL BE SENT TO YOUR REFERRING PHYSICIAN.  Hemoglobin 11.7 today. Procrit not needed. Return as scheduled.  Thank you for choosing [facility] at [facility] to provide your oncology and hematology care.  To afford each patient quality time with our provider, please arrive at least 15 minutes before your scheduled appointment time.    You need to re-schedule your appointment should you arrive 10 or more minutes late.  We strive to give you quality time with our providers, and arriving late affects you and other patients whose appointments are after yours.  Also, if you no show three or more times for appointments you may be dismissed from the clinic at the providers discretion.     Again, thank you for choosing [facility].  Our hope is that these requests will decrease the amount of time that you wait before being seen by our physicians.       _____________________________________________________________  Should you have questions after your visit to [facility], please contact our office at [phone] between the hours of 8:30 a.m. and 4:30 p.m.  Voicemails left after 4:30 p.m. will not be returned until the following business day.  For prescription refill requests, have your pharmacy contact our office.    

## 2015-05-12 NOTE — Progress Notes (Signed)
Hemoglobin 11.7. Procrit not given, treatment parameters not met.

## 2015-05-18 ENCOUNTER — Encounter: Payer: Medicare Other | Admitting: Nurse Practitioner

## 2015-05-25 NOTE — Progress Notes (Signed)
Cardiology Office Note Date:  05/26/2015  Patient ID:  Anne [name], Anne [name] 1928-02-26, MRN [id number] PCP:  Wende Neighbors, MD  Cardiologist:  Dr. Debara Pickett Electrophysiologist: Dr. Rayann Heman   Chief Complaint: annual EP visit  History of Present Illness: Anne [name] is a 81 y.o. female with history of ICM, with CRT pacer, CLL and anemia following with oncology with Procrit injections, DM, HLD, CAD with CABG x5 in 2011, comes to the office today for a routine visit.  She is feeling well, comes today accompanied by her daughter.  She denies any CP, palpitations or SOB, no dizziness, near syncope or syncope.    Past Medical History  Diagnosis Date  . CLL (chronic lymphocytic leukemia) (HCC)     stable  . Anemia   . DM (diabetes mellitus) ([place])   . Hypercholesteremia   . DJD (degenerative joint disease) of knee     bilat  . Hypertension   . Shortness of breath   . CHF (congestive heart failure) (HCC)     [place] Class 3 Heart Failure  . Blood transfusion   . CLL (chronic lymphocytic leukemia) ([place])   . CLL (chronic lymphocytic leukemia) ([place]) 04/03/2007    Stage 0 CLL    . Anemia of chronic disease 10/30/2011  . Ischemic cardiomyopathy     EF of 35% on 2D Echo done 04/07/12 moderate intraventricular dyssynchrony, moderate MR and moderate to severe TR with an RVSP of 31  . Coronary artery disease     three-vessel CAD with CABG -- x5 -- left internal mammary artery to LAD, sequential saphenous vein graft to first diagonal and first obtuse marginal, sequential saphenous vein graft to posterior descending and obtuse marginal 2 (posterolateral), endoscopic vein harvest of righ leg  . Anemia of chronic renal failure, stage 3 (moderate) 10/30/2011    Past Surgical History  Procedure Laterality Date  . Coronary artery bypass graft  06/22/2009    x5 -- left internal mammary artery to LAD, sequential saphenous vein graft to first diagonal and first obtuse marginal, sequential saphenous vein graft to  posterior descending and obtuse marginal 2 (posterolateral), endoscopic vein harvest of righ leg  . Goiter resection    . Thyroidectomy  1970s  . Cataract extraction, bilateral    . Rectal abscess    . Replacement total knee bilateral      bilateral  . Cardiac catheterization  01/18/2011    Est. EF of 25% to 30 - Mildly reduced cardiac output with no signs of decompensated heart failure and normal right heart filling pressures -- Patent bypass grafts with underlying three-vessel coronary artery disease  . Cardiac catheterization  06/14/2009    Est. EF of of 45% - Multivessel coronary artery disease with high-grade lesions in the LAD, left circumflex, and RCA -- Mildly reduced left ventricular function -- No significant aortic stenosis or mitral regurgitation  . Pacemaker insertion  04/16/2011    biventricular pacemaker insertion  . Eye surgery    . Steroid injections to sacral spine 01/28/13 - dr Brien Few  01/28/13  . Skin graft  09/2013    nose  . Bi-ventricular pacemaker insertion N/A 04/16/2011    Procedure: BI-VENTRICULAR PACEMAKER INSERTION (CRT-P);  Surgeon: Thompson Grayer, MD;  Location: [facility] CATH LAB;  Service: Cardiovascular;  Laterality: N/A;    Current Outpatient Prescriptions  Medication Sig Dispense Refill  . aspirin 81 MG EC tablet Take 81 mg by mouth daily.     . carvedilol (COREG) 6.25 MG tablet  TAKE 1 TABLET BY MOUTH TWICE DAILY WITH A MEAL 180 tablet 3  . citalopram (CELEXA) 10 MG tablet Take 10 mg by mouth 2 (two) times daily.    Marland Kitchen DIOVAN 160 MG tablet Take 80-160 mg by mouth daily. Take and additional 1/2 tab on M, W, F nights    . Docusate Calcium (STOOL SOFTENER PO) Take 1 capsule by mouth daily.     Marland Kitchen donepezil (ARICEPT) 5 MG tablet Take 1 tablet by mouth at bedtime.   5  . fish oil-omega-3 fatty acids 1000 MG capsule Take 1 g by mouth daily.     . furosemide (LASIX) 20 MG tablet Take 20 tablets by mouth every morning.   0  . HYDROcodone-acetaminophen (NORCO/VICODIN) 5-325  MG per tablet Take 1 tablet by mouth 3 (three) times daily.  0  . IRON CR PO Take 1 tablet by mouth every Monday, Wednesday, and Friday.     Marland Kitchen LEVEMIR FLEXTOUCH 100 UNIT/ML Pen Inject 25 Units into the skin every morning.   5  . LORazepam (ATIVAN) 0.5 MG tablet Take 0.5 mg by mouth every 8 (eight) hours as needed. FOR NERVES    . metFORMIN (GLUCOPHAGE) 1000 MG tablet Take 1,000 mg by mouth 2 (two) times daily with a meal.     . Multiple Vitamins-Minerals (CENTRUM SILVER PO) Take 1 tablet by mouth daily.     Marland Kitchen NAMENDA 10 MG tablet Take 10 mg by mouth 2 (two) times daily.     . ondansetron (ZOFRAN ODT) 4 MG disintegrating tablet 4mg  ODT q4 hours prn nausea/vomit 12 tablet 0  . pravastatin (PRAVACHOL) 40 MG tablet Take 1 tablet (40 mg total) by mouth daily. 90 tablet 2  . TRADJENTA 5 MG TABS tablet Take 1 tablet by mouth daily.  5  . traMADol (ULTRAM) 50 MG tablet Take 50 mg by mouth daily as needed for moderate pain or severe pain.     Marland Kitchen HUMALOG KWIKPEN 100 UNIT/ML KiwkPen Inject 0-12 Units into the skin every evening. Reported on 05/26/2015  5   No current facility-administered medications for this visit.    Allergies:   Lisinopril   Social History:  The patient  reports that she has never smoked. She has never used smokeless tobacco. She reports that she does not drink alcohol or use illicit drugs.   Family History:  The patient's family history includes Heart attack (age of onset: 51) in her brother; Heart disease (age of onset: 27) in her son; Heart failure (age of onset: 71) in her brother; Hypertension in her son.  ROS:  Please see the history of present illness.    All other systems are reviewed and otherwise negative.   PHYSICAL EXAM:  VS:  BP 120/66 mmHg  Pulse 84  Ht 5\' 3"  (1.6 m)  Wt 152 lb 6.4 oz (69.128 kg)  BMI 27.00 kg/m2 BMI: Body mass index is 27 kg/(m^2). Well nourished, well developed, elderly WF in no acute distress HEENT: normocephalic, atraumatic Neck: no JVD,  carotid bruits or masses Cardiac:  normal S1, S2; RRR; no significant murmurs, no rubs, or gallops Lungs:  clear to auscultation bilaterally, no wheezing, rhonchi or rales Abd: soft, nontender MS: no deformity, age apprporiate atrophy Ext: no edema Skin: warm and dry, no rash Neuro:  No gross deficits appreciated Psych: euthymic mood, full affect  PPM site is stable, no tethering or discomfort   EKG:  Done today shows SR, V paced, T changes appear similar to previous  PPM check today shows normal function, she has had some AT episodes, longest 24 minutes, one very brief PAF of 10 seconds  01/27/14: Echocardiogram Study Conclusions - Left ventricle: The cavity size was normal. There was mild focal basal and mild concentric hypertrophy of the septum. Systolic function was normal. The estimated ejection fraction was in the range of 55% to 60%. There is incoordinate septal motion. Doppler parameters are consistent with abnormal left ventricular relaxation (grade 1 diastolic dysfunction). The E/e&' ratio is <8, suggesting normal LV filling pressure. - Mitral valve: Calcified annulus. Mildly thickened leaflets . There was mild regurgitation. - Left atrium: Mildly dilated at 36 ml/m2. - Right ventricle: Pacer wire or catheter noted in right ventricle. - Right atrium: Pacer wire or catheter noted in right atrium. - Tricuspid valve: There was moderate regurgitation. - Pulmonary arteries: PA peak pressure: 25 mm Hg (S). - Inferior vena cava: The vessel was normal in size. The respirophasic diameter changes were in the normal range (= 50%), consistent with normal central venous pressure. - Pericardium, extracardiac: There was no pericardial effusion. Impressions: - Compared to prior echos, the EF has now normalized.  Recent Labs: 04/01/2015: ALT 12*; BUN 30*; Creatinine, Ser 1.15*; Potassium 5.0; Sodium 138 05/12/2015: Hemoglobin 11.7*; Platelets 165  No results found for  requested labs within last 365 days.   CrCl cannot be calculated (Patient has no serum creatinine result on file.).   Wt Readings from Last 3 Encounters:  05/26/15 152 lb 6.4 oz (69.128 kg)  04/01/15 154 lb (69.854 kg)  02/23/15 153 lb 11.2 oz (69.718 kg)     Other studies reviewed: Additional studies/records reviewed today include: summarized above  DEVICE information: STJ CRT-pacer implanted Nov 2012, Dr. Rayann Heman  ASSESSMENT AND PLAN:  1. ICM, CRT-P appears well compensated LVEF normalized as of last echo On BB/ARB, diuretic Q 3month [place]  2. CAD follows with cardiology no CP On ASA, statin, BB  3. CLL/anemia Follows with oncology  4. HTN Appears well controlled  5. AT longest noted 67minutes, very brief AF (10 seconds) No symptoms Continue to monitor via remote checks   Disposition: F/u with EP, Dr. Rayann Heman in 1 year, continue with her PMD, oncologist and Dr. Debara Pickett as recommended by them.  She will continue Q 3 month remote monitoring for her device  Current medicines are reviewed at length with the patient today.  The patient did not have any concerns regarding medicines.  Haywood Lasso, PA-C 05/26/2015 11:04 AM     [facility] [place] [address] [phone] (office)  [phone] (fax)

## 2015-05-26 ENCOUNTER — Ambulatory Visit (INDEPENDENT_AMBULATORY_CARE_PROVIDER_SITE_OTHER): Payer: Medicare Other | Admitting: Physician Assistant

## 2015-05-26 ENCOUNTER — Encounter: Payer: Self-pay | Admitting: Internal Medicine

## 2015-05-26 ENCOUNTER — Encounter: Payer: Self-pay | Admitting: Physician Assistant

## 2015-05-26 VITALS — BP 120/66 | HR 84 | Ht 63.0 in | Wt 152.4 lb

## 2015-05-26 DIAGNOSIS — I471 Supraventricular tachycardia: Secondary | ICD-10-CM | POA: Diagnosis not present

## 2015-05-26 DIAGNOSIS — I10 Essential (primary) hypertension: Secondary | ICD-10-CM

## 2015-05-26 DIAGNOSIS — I255 Ischemic cardiomyopathy: Secondary | ICD-10-CM | POA: Diagnosis not present

## 2015-05-26 DIAGNOSIS — I2510 Atherosclerotic heart disease of native coronary artery without angina pectoris: Secondary | ICD-10-CM

## 2015-05-26 NOTE — Patient Instructions (Signed)
Medication Instructions:     If you need a refill on your cardiac medications before your next appointment, please call your pharmacy.  Labwork:   Testing/Procedures:   Follow-Up:   Your physician wants you to follow-up in: [place] will receive a reminder letter in the mail two months in advance. If you don't receive a letter, please call our office to schedule the follow-up appointment.    Remote monitoring is used to monitor your Pacemaker of ICD from home. This monitoring reduces the number of office visits required to check your device to one time per year. It allows [place] to keep an eye on the functioning of your device to ensure it is working properly. You are scheduled for a device check from home on .08/24/15 You may send your transmission at any time that day. If you have a wireless device, the transmission will be sent automatically. After your physician reviews your transmission, you will receive a postcard with your next transmission date.     Any Other Special Instructions Will Be Listed Below (If Applicable).

## 2015-05-31 ENCOUNTER — Other Ambulatory Visit (HOSPITAL_COMMUNITY): Payer: Self-pay

## 2015-06-02 LAB — CUP PACEART INCLINIC DEVICE CHECK
Date Time Interrogation Session: 20170105114756
Implantable Lead Implant Date: 20121119
Implantable Lead Implant Date: 20121119
Lead Channel Setting Pacing Amplitude: 2.375
Lead Channel Setting Pacing Pulse Width: 0.4 ms
Lead Channel Setting Pacing Pulse Width: 0.4 ms
MDC IDC LEAD IMPLANT DT: 20121119
MDC IDC LEAD LOCATION: 753858
MDC IDC LEAD LOCATION: 753859
MDC IDC LEAD LOCATION: 753860
MDC IDC PG SERIAL: 2707573
MDC IDC SET LEADCHNL LV PACING AMPLITUDE: 2.125
MDC IDC SET LEADCHNL RV PACING AMPLITUDE: 2 V
MDC IDC SET LEADCHNL RV SENSING SENSITIVITY: 2 mV

## 2015-06-03 ENCOUNTER — Other Ambulatory Visit (HOSPITAL_COMMUNITY): Payer: Medicare Other

## 2015-06-03 ENCOUNTER — Encounter (HOSPITAL_COMMUNITY): Payer: Medicare Other

## 2015-06-09 ENCOUNTER — Encounter (HOSPITAL_COMMUNITY): Payer: Medicare Other

## 2015-06-09 ENCOUNTER — Encounter (HOSPITAL_COMMUNITY): Payer: Self-pay

## 2015-06-09 ENCOUNTER — Encounter (HOSPITAL_COMMUNITY): Payer: Medicare Other | Attending: Oncology

## 2015-06-09 VITALS — BP 103/67 | HR 78 | Temp 98.7°F | Resp 18

## 2015-06-09 DIAGNOSIS — D638 Anemia in other chronic diseases classified elsewhere: Secondary | ICD-10-CM | POA: Insufficient documentation

## 2015-06-09 DIAGNOSIS — N183 Chronic kidney disease, stage 3 (moderate): Principal | ICD-10-CM

## 2015-06-09 DIAGNOSIS — D631 Anemia in chronic kidney disease: Secondary | ICD-10-CM | POA: Diagnosis not present

## 2015-06-09 DIAGNOSIS — C9110 Chronic lymphocytic leukemia of B-cell type not having achieved remission: Secondary | ICD-10-CM | POA: Diagnosis present

## 2015-06-09 LAB — CBC WITH DIFFERENTIAL/PLATELET
Basophils Absolute: 0 10*3/uL (ref 0.0–0.1)
Basophils Relative: 0 %
EOS ABS: 0.1 10*3/uL (ref 0.0–0.7)
EOS PCT: 1 %
HCT: 31.4 % — ABNORMAL LOW (ref 36.0–46.0)
Hemoglobin: 10.4 g/dL — ABNORMAL LOW (ref 12.0–15.0)
LYMPHS ABS: 11.1 10*3/uL — AB (ref 0.7–4.0)
Lymphocytes Relative: 78 %
MCH: 30.9 pg (ref 26.0–34.0)
MCHC: 33.1 g/dL (ref 30.0–36.0)
MCV: 93.2 fL (ref 78.0–100.0)
MONO ABS: 0.3 10*3/uL (ref 0.1–1.0)
MONOS PCT: 2 %
Neutro Abs: 2.7 10*3/uL (ref 1.7–7.7)
Neutrophils Relative %: 19 %
PLATELETS: 209 10*3/uL (ref 150–400)
RBC: 3.37 MIL/uL — ABNORMAL LOW (ref 3.87–5.11)
RDW: 14.6 % (ref 11.5–15.5)
WBC: 14.2 10*3/uL — ABNORMAL HIGH (ref 4.0–10.5)

## 2015-06-09 LAB — FERRITIN: FERRITIN: 235 ng/mL (ref 11–307)

## 2015-06-09 MED ORDER — EPOETIN ALFA 40000 UNIT/ML IJ SOLN
INTRAMUSCULAR | Status: AC
  Filled 2015-06-09: qty 1

## 2015-06-09 MED ORDER — EPOETIN ALFA 40000 UNIT/ML IJ SOLN
40000.0000 [IU] | Freq: Once | INTRAMUSCULAR | Status: AC
  Administered 2015-06-09: 40000 [IU] via SUBCUTANEOUS

## 2015-06-09 NOTE — Progress Notes (Signed)
Anne [name] presents today for injection per MD orders. Procrit 40,01mcg administered SQ in left Abdomen. Administration without incident. Patient tolerated well.

## 2015-06-17 ENCOUNTER — Other Ambulatory Visit (HOSPITAL_COMMUNITY): Payer: Self-pay | Admitting: Podiatry

## 2015-06-17 DIAGNOSIS — R0989 Other specified symptoms and signs involving the circulatory and respiratory systems: Secondary | ICD-10-CM

## 2015-06-27 ENCOUNTER — Ambulatory Visit (HOSPITAL_COMMUNITY)
Admission: RE | Admit: 2015-06-27 | Discharge: 2015-06-27 | Disposition: A | Discharge status: Home / Self Care | Payer: Medicare Other | Source: Ambulatory Visit | Attending: Podiatry | Admitting: Podiatry

## 2015-06-27 DIAGNOSIS — I70209 Unspecified atherosclerosis of native arteries of extremities, unspecified extremity: Secondary | ICD-10-CM | POA: Diagnosis not present

## 2015-06-27 DIAGNOSIS — R0989 Other specified symptoms and signs involving the circulatory and respiratory systems: Secondary | ICD-10-CM | POA: Diagnosis present

## 2015-07-01 ENCOUNTER — Encounter (HOSPITAL_COMMUNITY): Payer: Medicare Other

## 2015-07-01 ENCOUNTER — Other Ambulatory Visit (HOSPITAL_COMMUNITY): Payer: Medicare Other

## 2015-07-07 ENCOUNTER — Encounter (HOSPITAL_COMMUNITY): Payer: Medicare Other | Attending: Oncology

## 2015-07-07 ENCOUNTER — Encounter (HOSPITAL_COMMUNITY): Payer: Medicare Other | Attending: Hematology & Oncology

## 2015-07-07 VITALS — BP 105/55 | HR 80 | Resp 18

## 2015-07-07 DIAGNOSIS — C9110 Chronic lymphocytic leukemia of B-cell type not having achieved remission: Secondary | ICD-10-CM | POA: Diagnosis present

## 2015-07-07 DIAGNOSIS — D638 Anemia in other chronic diseases classified elsewhere: Secondary | ICD-10-CM | POA: Diagnosis not present

## 2015-07-07 DIAGNOSIS — D631 Anemia in chronic kidney disease: Secondary | ICD-10-CM

## 2015-07-07 DIAGNOSIS — N183 Chronic kidney disease, stage 3 (moderate): Secondary | ICD-10-CM

## 2015-07-07 LAB — FERRITIN: Ferritin: 295 ng/mL (ref 11–307)

## 2015-07-07 MED ORDER — EPOETIN ALFA 40000 UNIT/ML IJ SOLN
INTRAMUSCULAR | Status: AC
  Filled 2015-07-07: qty 1

## 2015-07-14 ENCOUNTER — Other Ambulatory Visit: Payer: Self-pay

## 2015-07-14 DIAGNOSIS — I739 Peripheral vascular disease, unspecified: Secondary | ICD-10-CM

## 2015-07-20 ENCOUNTER — Encounter: Payer: Self-pay | Admitting: Internal Medicine

## 2015-07-22 ENCOUNTER — Ambulatory Visit (HOSPITAL_COMMUNITY): Payer: Medicare Other | Admitting: Oncology

## 2015-07-25 ENCOUNTER — Other Ambulatory Visit: Payer: Self-pay

## 2015-07-25 ENCOUNTER — Encounter: Payer: Self-pay | Admitting: Gastroenterology

## 2015-07-25 ENCOUNTER — Ambulatory Visit (INDEPENDENT_AMBULATORY_CARE_PROVIDER_SITE_OTHER): Payer: Medicare Other | Admitting: Gastroenterology

## 2015-07-25 ENCOUNTER — Telehealth: Payer: Self-pay

## 2015-07-25 VITALS — BP 131/76 | HR 96 | Temp 97.5°F | Ht 63.0 in | Wt 143.4 lb

## 2015-07-25 DIAGNOSIS — D649 Anemia, unspecified: Secondary | ICD-10-CM

## 2015-07-25 DIAGNOSIS — R634 Abnormal weight loss: Secondary | ICD-10-CM

## 2015-07-25 DIAGNOSIS — R630 Anorexia: Secondary | ICD-10-CM

## 2015-07-25 DIAGNOSIS — K921 Melena: Secondary | ICD-10-CM | POA: Insufficient documentation

## 2015-07-25 MED ORDER — OMEPRAZOLE 20 MG PO CPDR: 20.0000 mg | DELAYED_RELEASE_CAPSULE | Freq: Every day | ORAL | Status: AC

## 2015-07-25 NOTE — Assessment & Plan Note (Signed)
82 y/o female with CLL, chronic anemia on Epogen, who presents with several week history of melena, anorexia, N/V, weight loss. Noted to have right sided abd pain on exam. Decline in Hgb by one gram over several week period as described. Patient has had notable decrease in energy level and doing much less for herself. On ASA daily but no other NSAIDs. Would be conerned for UGI bleed from PUD, gastritis, malignancy. Offered EGD for further evaluation. Discussed at length with daughter and patient.  I have discussed the risks, alternatives, benefits with regards to but not limited to the risk of reaction to medication, bleeding, infection, perforation and the patient is agreeable to proceed. Written consent to be obtained.  Start PPI today. Obtain CBC, CMET today.

## 2015-07-25 NOTE — Progress Notes (Signed)
Primary Care Physician:  Wende Neighbors, MD  Primary Gastroenterologist:  Garfield Cornea, MD   Chief Complaint  Patient presents with  . black stools  . Emesis  . Nausea    HPI:  Anne [name] is a 82 y.o. female with history of CLL followed by [facility], element of anemia felt to be multifactorial from anemia of chronic disease and/or CLL, receives Epogen as needed who presents at the request of Dr. Wende Neighbors for further evaluation of N/V, melena.   Epogen 07/07/2015. Weight is down about 10 pounds since December. 2-3 weeks of melena, diminished appetite. Vomiting intermittent, none in the last couple of weeks. Has usually been in car and when not at home. No hematemesis. 50% decreased activity level. Increased weakness. Daughter is retired Haematologist. States she has seen black, sticky stools are couple of weeks. Initially 2-3 per day, now 1-2 per day. Has been on iron chronically. No Pepto.   Saw Dr. Nevada Crane, noted decline in her Hgb to 9.6 from 10.4 when checked last week. Patient has some dementia. She denies abdominal pain, dysphagia, brbpr.  Abd ct with contrast 2016 with nonspecific mild thickening of pyloric wall without outlet obstruction  Current Outpatient Prescriptions  Medication Sig Dispense Refill  . aspirin 81 MG EC tablet Take 81 mg by mouth daily.     . carvedilol (COREG) 6.25 MG tablet TAKE 1 TABLET BY MOUTH TWICE DAILY WITH A MEAL 180 tablet 3  . citalopram (CELEXA) 10 MG tablet Take 10 mg by mouth 2 (two) times daily.    Marland Kitchen DIOVAN 160 MG tablet Take 80-160 mg by mouth daily. Take and additional 1/2 tab on M, W, F nights    . Docusate Calcium (STOOL SOFTENER PO) Take 1 capsule by mouth daily.     Marland Kitchen donepezil (ARICEPT) 5 MG tablet Take 1 tablet by mouth at bedtime.   5  . fish oil-omega-3 fatty acids 1000 MG capsule Take 1 g by mouth daily.     Marland Kitchen HUMALOG KWIKPEN 100 UNIT/ML KiwkPen Inject 0-12 Units into the skin every evening. Reported on 05/26/2015  5  .  HYDROcodone-acetaminophen (NORCO/VICODIN) 5-325 MG per tablet Take 1 tablet by mouth 3 (three) times daily.  0  . IRON CR PO Take 1 tablet by mouth every Monday, Wednesday, and Friday.     Marland Kitchen LEVEMIR FLEXTOUCH 100 UNIT/ML Pen Inject 25 Units into the skin every morning.   5  . LORazepam (ATIVAN) 0.5 MG tablet Take 0.5 mg by mouth every 8 (eight) hours as needed. FOR NERVES    . metFORMIN (GLUCOPHAGE) 1000 MG tablet Take 1,000 mg by mouth 2 (two) times daily with a meal.     . NAMENDA 10 MG tablet Take 10 mg by mouth 2 (two) times daily.     . ondansetron (ZOFRAN ODT) 4 MG disintegrating tablet 4mg  ODT q4 hours prn nausea/vomit 12 tablet 0  . pravastatin (PRAVACHOL) 40 MG tablet Take 1 tablet (40 mg total) by mouth daily. 90 tablet 2  . TRADJENTA 5 MG TABS tablet Take 1 tablet by mouth daily.  5  . traMADol (ULTRAM) 50 MG tablet Take 50 mg by mouth daily as needed for moderate pain or severe pain.     . Multiple Vitamins-Minerals (CENTRUM SILVER PO) Take 1 tablet by mouth daily.      No current facility-administered medications for this visit.    Allergies as of 07/25/2015 - Review Complete 07/25/2015  Allergen Reaction Noted  .  Lisinopril  04/02/2007    Past Medical History  Diagnosis Date  . CLL (chronic lymphocytic leukemia) (HCC)     stable  . Anemia   . DM (diabetes mellitus) ([place])   . Hypercholesteremia   . DJD (degenerative joint disease) of knee     bilat  . Hypertension   . Shortness of breath   . CHF (congestive heart failure) (HCC)     [place] Class 3 Heart Failure  . Blood transfusion   . CLL (chronic lymphocytic leukemia) ([place])   . CLL (chronic lymphocytic leukemia) ([place]) 04/03/2007    Stage 0 CLL    . Anemia of chronic disease 10/30/2011  . Ischemic cardiomyopathy     EF of 35% on 2D Echo done 04/07/12 moderate intraventricular dyssynchrony, moderate MR and moderate to severe TR with an RVSP of 31  . Coronary artery disease     three-vessel CAD with CABG -- x5 -- left  internal mammary artery to LAD, sequential saphenous vein graft to first diagonal and first obtuse marginal, sequential saphenous vein graft to posterior descending and obtuse marginal 2 (posterolateral), endoscopic vein harvest of righ leg  . Anemia of chronic renal failure, stage 3 (moderate) 10/30/2011  . Hypothyroidism   . Memory loss     Past Surgical History  Procedure Laterality Date  . Coronary artery bypass graft  06/22/2009    x5 -- left internal mammary artery to LAD, sequential saphenous vein graft to first diagonal and first obtuse marginal, sequential saphenous vein graft to posterior descending and obtuse marginal 2 (posterolateral), endoscopic vein harvest of righ leg  . Goiter resection    . Thyroidectomy  1970s  . Cataract extraction, bilateral    . Rectal abscess    . Replacement total knee bilateral      bilateral  . Cardiac catheterization  01/18/2011    Est. EF of 25% to 30 - Mildly reduced cardiac output with no signs of decompensated heart failure and normal right heart filling pressures -- Patent bypass grafts with underlying three-vessel coronary artery disease  . Cardiac catheterization  06/14/2009    Est. EF of of 45% - Multivessel coronary artery disease with high-grade lesions in the LAD, left circumflex, and RCA -- Mildly reduced left ventricular function -- No significant aortic stenosis or mitral regurgitation  . Pacemaker insertion  04/16/2011    biventricular pacemaker insertion  . Eye surgery    . Steroid injections to sacral spine 01/28/13 - dr Brien Few  01/28/13  . Skin graft  09/2013    nose  . Bi-ventricular pacemaker insertion N/A 04/16/2011    Procedure: BI-VENTRICULAR PACEMAKER INSERTION (CRT-P);  Surgeon: Thompson Grayer, MD;  Location: [facility] CATH LAB;  Service: Cardiovascular;  Laterality: N/A;    Family History  Problem Relation Age of Onset  . Heart failure Brother 82  . Heart attack Brother 33  . Heart disease Son 66    Had CABG x 1  . Hypertension  Son     Social History   Social History  . Marital Status: Widowed    Spouse Name: N/A  . Number of Children: 3  . Years of Education: N/A   Occupational History  .     Social History Main Topics  . Smoking status: Never Smoker   . Smokeless tobacco: Never Used  . Alcohol Use: No  . Drug Use: No  . Sexual Activity: No   Other Topics Concern  . Not on file   Social History Narrative  [employer] in [place].  Systems analyst prior to retiring.      ROS: unreliable. States she is not having any problems  General: Negative for anorexia, weight loss, fever, chills, fatigue, weakness. Eyes: Negative for vision changes.  ENT: Negative for hoarseness, difficulty swallowing , nasal congestion. CV: Negative for chest pain, angina, palpitations, dyspnea on exertion, peripheral edema.  Respiratory: Negative for dyspnea at rest, dyspnea on exertion, cough, sputum, wheezing.  GI: See history of present illness. GU:  Negative for dysuria, hematuria, urinary incontinence, urinary frequency, nocturnal urination.  MS: Negative for joint pain, low back pain.  Derm: Negative for rash or itching.  Neuro: Negative for weakness, abnormal sensation, seizure, frequent headaches, memory loss, confusion.  Psych: Negative for anxiety, depression, suicidal ideation, hallucinations.  Endo: Negative for unusual weight change.  Heme: Negative for bruising or bleeding. Allergy: Negative for rash or hives.    Physical Examination:  BP 131/76 mmHg  Pulse 96  Temp(Src) 97.5 F (36.4 C)  Ht 5\' 3"  (1.6 m)  Wt 143 lb 6.4 oz (65.046 kg)  BMI 25.41 kg/m2   General: Well-nourished, well-developed in no acute distress. accompanied by daughter.  Head: Normocephalic, atraumatic.   Eyes: Conjunctiva pink, no icterus. Mouth: Oropharyngeal mucosa moist and pink , no lesions erythema or exudate. Neck: Supple without thyromegaly, masses, or lymphadenopathy.  Lungs: Clear to auscultation bilaterally.   Heart: Regular rate and rhythm, no murmurs rubs or gallops.  Abdomen: Bowel sounds are normal, right upper/right mid abd tenderness, nondistended, no hepatosplenomegaly or masses, no abdominal bruits or    hernia , no rebound or guarding.   Rectal: not performed Extremities: No lower extremity edema. No clubbing or deformities.  Neuro: Alert and oriented x 4 , grossly normal neurologically.  Skin: Warm and dry, no rash or jaundice.   Psych: Alert and cooperative, normal mood and affect.  Labs: Labs from 07/19/2015 White blood cell count 19,200, hemoglobin 9.6, hematocrit 30.7, MCV 91.1, platelets 354,000  Hemoglobin was 10.4 on 06/09/2015  Lab Results  Component Value Date   FERRITIN 295 07/07/2015   Lab Results  Component Value Date   CREATININE 1.15* 04/01/2015   BUN 30* 04/01/2015   NA 138 04/01/2015   K 5.0 04/01/2015   CL 107 04/01/2015   CO2 25 04/01/2015   Lab Results  Component Value Date   ALT 12* 04/01/2015   AST 21 04/01/2015   ALKPHOS 52 04/01/2015   BILITOT 0.7 04/01/2015    Imaging Studies: US Arterial Seg Multiple  06/27/2015  CLINICAL DATA:  82 year old female with diminished lower extremity pulses EXAM: NONINVASIVE PHYSIOLOGIC VASCULAR STUDY OF BILATERAL LOWER EXTREMITIES TECHNIQUE: Evaluation of both lower extremities was performed at rest, including calculation of ankle-brachial indices, multiple segmental pressure evaluation, segmental Doppler and segmental pulse volume recording. COMPARISON:  None. FINDINGS: Right ABI:  1.3 Right toe brachial index:  0.65 Left ABI:  1.4 Left toe brachial index:  0.22 Right Lower Extremity: Limited segmental pressures secondary to incompressibility of the thigh and calf arteries. This may be related to patient body habitus. Arterial waveforms appear triphasic to the level of the ankle. Pulse volume recordings are relatively preserved. Left Lower Extremity: Limited segmental pressures secondary to incompressibility of the  thigh and calf arteries likely related to patient body habitus. Abnormal popliteal, posterior tibial and dorsalis pedis waveforms suggesting runoff disease. Additionally, significantly diminished waveforms in the metatarsal and digital arteries consistent with small vessels disease. IMPRESSION: 1. No evidence of hemodynamically significant peripheral arterial  disease on the right. 2. Although the left ankle brachial index is at the upper limits of normal at 1.4 this may be falsely elevated secondary to medial arterial sclerosis. The arterial waveforms and toe brachial index suggest moderate to severe peripheral arterial disease likely secondary to a combination of femoral popliteal and small vessel disease. Signed, Criselda Peaches, MD [facility] Electronically Signed   By: Jacqulynn Cadet M.D.   On: 06/27/2015 16:07

## 2015-07-25 NOTE — Patient Instructions (Signed)
Please have your labs done.  Upper endoscopy with Dr. Gala Romney. See separate instructions.  Start Omeprazole once daily 30 minutes before breakfast. RX sent to [facility].

## 2015-07-25 NOTE — Telephone Encounter (Signed)
Daughter Rise Paganini called and states that she wants to make sure that LSL will call her with the results of her mothers labs and tell her yes or no if her mother will need a transfusion or not.  Routing to [employer]

## 2015-07-25 NOTE — Telephone Encounter (Signed)
I have checked the office and the [facility] website and have not found the results. I called and spoke with Jeani Hawking at [facility] and she checked on it and they did not send it stat. She said it was ordered STAT but they overlooked it. She called [facility] customer service and they will run it STAT and needed a cell number, LSL said it was ok to give them her number. Her cell number was given to them. They have a 4 hour turn around time on STAT labs. I have spoken to [place], the pts daughter and she was very understanding and is glad that they will have the lab results for her procedure in the AM.

## 2015-07-26 ENCOUNTER — Encounter (HOSPITAL_COMMUNITY): Payer: Self-pay | Admitting: *Deleted

## 2015-07-26 ENCOUNTER — Ambulatory Visit (HOSPITAL_COMMUNITY)
Admission: RE | Admit: 2015-07-26 | Discharge: 2015-07-26 | Disposition: A | Discharge status: Home / Self Care | Payer: Medicare Other | Source: Ambulatory Visit | Attending: Internal Medicine | Admitting: Internal Medicine

## 2015-07-26 ENCOUNTER — Telehealth: Payer: Self-pay | Admitting: Gastroenterology

## 2015-07-26 ENCOUNTER — Encounter (HOSPITAL_COMMUNITY)
Admission: RE | Disposition: A | Discharge status: Home / Self Care | Payer: Self-pay | Source: Ambulatory Visit | Attending: Internal Medicine

## 2015-07-26 DIAGNOSIS — R634 Abnormal weight loss: Secondary | ICD-10-CM | POA: Diagnosis not present

## 2015-07-26 DIAGNOSIS — K921 Melena: Secondary | ICD-10-CM

## 2015-07-26 DIAGNOSIS — Z5309 Procedure and treatment not carried out because of other contraindication: Secondary | ICD-10-CM | POA: Diagnosis not present

## 2015-07-26 DIAGNOSIS — R112 Nausea with vomiting, unspecified: Secondary | ICD-10-CM | POA: Insufficient documentation

## 2015-07-26 DIAGNOSIS — R630 Anorexia: Secondary | ICD-10-CM

## 2015-07-26 DIAGNOSIS — D649 Anemia, unspecified: Secondary | ICD-10-CM

## 2015-07-26 LAB — CBC WITH DIFFERENTIAL/PLATELET
BASOS: 0 %
Basophils Absolute: 0.1 10*3/uL (ref 0.0–0.2)
EOS (ABSOLUTE): 0.1 10*3/uL (ref 0.0–0.4)
Eos: 0 %
HEMATOCRIT: 30.5 % — AB (ref 34.0–46.6)
Hemoglobin: 9.5 g/dL — ABNORMAL LOW (ref 11.1–15.9)
Immature Grans (Abs): 0 10*3/uL (ref 0.0–0.1)
Immature Granulocytes: 0 %
LYMPHS ABS: 14.2 10*3/uL — AB (ref 0.7–3.1)
Lymphs: 71 %
MCH: 28.8 pg (ref 26.6–33.0)
MCHC: 31.1 g/dL — AB (ref 31.5–35.7)
MCV: 92 fL (ref 79–97)
MONOS ABS: 0.6 10*3/uL (ref 0.1–0.9)
Monocytes: 3 %
NEUTROS ABS: 5.2 10*3/uL (ref 1.4–7.0)
NEUTROS PCT: 26 %
Platelets: 378 10*3/uL (ref 150–379)
RBC: 3.3 x10E6/uL — ABNORMAL LOW (ref 3.77–5.28)
RDW: 16.1 % — AB (ref 12.3–15.4)
WBC: 20.2 10*3/uL (ref 3.4–10.8)

## 2015-07-26 LAB — COMPREHENSIVE METABOLIC PANEL
A/G RATIO: 1.4 (ref 1.1–2.5)
ALBUMIN: 3.4 g/dL — AB (ref 3.5–4.7)
ALK PHOS: 61 IU/L (ref 39–117)
ALT: 7 IU/L (ref 0–32)
AST: 27 IU/L (ref 0–40)
BUN / CREAT RATIO: 20 (ref 11–26)
BUN: 32 mg/dL — ABNORMAL HIGH (ref 8–27)
Bilirubin Total: 0.5 mg/dL (ref 0.0–1.2)
CO2: 18 mmol/L (ref 18–29)
Calcium: 9.7 mg/dL (ref 8.7–10.3)
Chloride: 99 mmol/L (ref 96–106)
Creatinine, Ser: 1.61 mg/dL — ABNORMAL HIGH (ref 0.57–1.00)
GFR, EST AFRICAN AMERICAN: 33 mL/min/{1.73_m2} — AB (ref >59)
GFR, EST NON AFRICAN AMERICAN: 29 mL/min/{1.73_m2} — AB (ref >59)
GLOBULIN, TOTAL: 2.5 g/dL (ref 1.5–4.5)
Glucose: 106 mg/dL — ABNORMAL HIGH (ref 65–99)
POTASSIUM: 5.7 mmol/L — AB (ref 3.5–5.2)
SODIUM: 134 mmol/L (ref 134–144)
Total Protein: 5.9 g/dL — ABNORMAL LOW (ref 6.0–8.5)

## 2015-07-26 LAB — GLUCOSE, CAPILLARY: GLUCOSE-CAPILLARY: 137 mg/dL — AB (ref 65–99)

## 2015-07-26 SURGERY — CANCELLED PROCEDURE

## 2015-07-26 MED ORDER — SIMETHICONE 40 MG/0.6ML PO SUSP
ORAL | Status: AC
  Filled 2015-07-26: qty 30

## 2015-07-26 MED ORDER — SODIUM CHLORIDE 0.9 % IV SOLN
INTRAVENOUS | Status: DC
  Administered 2015-07-26: 08:00:00 via INTRAVENOUS

## 2015-07-26 MED ORDER — MIDAZOLAM HCL 5 MG/5ML IJ SOLN
INTRAMUSCULAR | Status: AC
  Filled 2015-07-26: qty 10

## 2015-07-26 MED ORDER — MEPERIDINE HCL 100 MG/ML IJ SOLN
INTRAMUSCULAR | Status: AC
  Filled 2015-07-26: qty 2

## 2015-07-26 MED ORDER — ONDANSETRON HCL 4 MG/2ML IJ SOLN
INTRAMUSCULAR | Status: AC
  Filled 2015-07-26: qty 2

## 2015-07-26 MED ORDER — LIDOCAINE VISCOUS 2 % MT SOLN
OROMUCOSAL | Status: AC
  Filled 2015-07-26: qty 15

## 2015-07-26 NOTE — Telephone Encounter (Signed)
Tried to call pts daughterRise Paganini, she said she wasn't able to talk on the phone at the moment but would call me back.

## 2015-07-26 NOTE — Telephone Encounter (Signed)
Can we touch base with daughter and/or Dr. Wende Neighbors to make sure the potassium and creatinine issue is being addressed today. Send copy of labs to Dr. Nevada Crane as well.  We need to look forward and see if there is a spot we can put on hold for EGD, ?next week, to allow time for above issues to be addressed.

## 2015-07-26 NOTE — Progress Notes (Signed)
CC'D TO PCP °

## 2015-07-26 NOTE — Telephone Encounter (Signed)
Spoke with Daughter Rise Paganini. She states that they are seeing Dr. Nevada Crane today at 12:00pm. She is aware of 1st available time for RMR is on 03/20 @ 2:30pm.  Informed her that if someone cancels we can possible move pt up.   Spot on hold for now and will redo instructions once everything is cleared with PCP

## 2015-07-26 NOTE — Telephone Encounter (Signed)
Patient and daughter made aware of results this morning when presenting for EGD. I received phone call from lab at 3:40am with labs values on labs that I ordered yesterday AM. Patient's procedure had to be cancelled for elevated potassium. Per RMR, patient is following up with PCP today for elevated creatinine and potassium. Then plan on EGD once potassium has normalized.  See telephone note from today. See lab result note for additional instructions as well.

## 2015-07-26 NOTE — Telephone Encounter (Signed)
Sent labs to Dr. Nevada Crane

## 2015-07-26 NOTE — Progress Notes (Signed)
Quick Note:  Tom, We are seeing this patient for melena, N/V. EGD planned for today. Her labs showed increase in WBC from what appears to be her baseline. I am going to do a U/A with culture but otherwise there are no signs of infection from clinical standpoint. She has upcoming OV with you. Let me know if you would like me to do anything else in the interim. ______

## 2015-07-26 NOTE — Telephone Encounter (Signed)
Anne [name], see result note, I would also recommend U/A with culture reflex given increase in her WBC.

## 2015-07-26 NOTE — Telephone Encounter (Signed)
-----   Message from Daneil Dolin, MD sent at 07/26/2015  8:48 AM EST ----- Repeat potassium 5.6. Also [facility] labs reported indicated elevated creatinine from yesterday as well. Patient remained stable. Hemoglobin 9.5 this morning.  Elevated potassium and creatinine. Elective EGD today canceled. Patient is to omeprazole. Patient is followed by Dr. Wende Neighbors today in regards to management of hyperkalemia. We will get patient back on track for EGD once potassium is normal. I discussed reasoning for holding off on EGD today with her son and daughter. They seem to understand.

## 2015-07-26 NOTE — Telephone Encounter (Signed)
Ist available procedure spot is 08/15/2015.

## 2015-07-26 NOTE — Progress Notes (Signed)
Quick Note:  Patient has EGD today. Please let daughter know her H/H is stable. Her WBC is up some and I will forward to Kirby Crigler for [place]. Renal function up some as well. LFTs are normal.   1#We need to send copy of labs to PCP for worsening renal function and for them to follow up on. 2#Would recommend U/A with culture reflex given increase in WBC, patient unable to give reliable history for dysuria, etc   ______

## 2015-07-26 NOTE — OR Nursing (Signed)
Patient in for an EGD. Patient had an abnormal potassium on labs yesterday. I stat done per MD order and potassium was 5.6. Dr. Gala Romney cancelled EGD for today. Patient to follow up with Dr. Nevada Crane.

## 2015-07-27 ENCOUNTER — Other Ambulatory Visit: Payer: Self-pay

## 2015-07-27 ENCOUNTER — Telehealth: Payer: Self-pay | Admitting: Internal Medicine

## 2015-07-27 DIAGNOSIS — R634 Abnormal weight loss: Secondary | ICD-10-CM

## 2015-07-27 DIAGNOSIS — D649 Anemia, unspecified: Secondary | ICD-10-CM

## 2015-07-27 DIAGNOSIS — K921 Melena: Secondary | ICD-10-CM

## 2015-07-27 DIAGNOSIS — R630 Anorexia: Secondary | ICD-10-CM

## 2015-07-27 NOTE — Telephone Encounter (Signed)
Pt was seen recently by LSL from a referral by PCP for having black tarry stools. The daughter, Rise Paganini, called today asking if LSL would return her call.  She wanted to know what they needed to do to move forward with her mother's care. The patient's procedure was cancelled and pushed back 3 weeks. Daughter is very tearful and is worried about her mother. Please advise and call Rise Paganini at [phone]

## 2015-07-27 NOTE — Telephone Encounter (Signed)
Spoke with [employer]. Last night, mother had solid black stool and liquid, large amounts. Very weak, sugar was in 20 range. Having to force feed her. Holding DM meds right now. Sugar was up to 68 this morning. Daughter following and checking sugars.   Saw Dr. Nevada Crane yesterday, given dietary restrictions of potassium containing foods. Stopped diovan due to BP in the 90/60 range in the office. Plan for follow up ov in next Tuesday for repeat labs.   She is concerned about waiting three weeks until her EGD given quick decline. I advised her that she should consider ER evaluation and possible admission or at minimum repeat labs today. She felt like mother may be a little better today. She states she will take her to ER if she has another episode of melena or things worsen. She will call if she decides she wants me to check her labs CBC, MET-7 again. Otherwise, she plans to wait until sees Dr. Nevada Crane on Tuesday.   IS THERE ANYWAY WE CAN FIND A SPOT FOR HER TO HAVE EGD LATE NEXT WEEK.

## 2015-07-27 NOTE — Telephone Encounter (Signed)
pts daughter is aware. She will take her to the lab tomorrow.

## 2015-07-27 NOTE — Telephone Encounter (Signed)
Lab orders done and faxed to the lab.

## 2015-07-27 NOTE — Telephone Encounter (Signed)
Spoke with [facility] and they are going to have the blood work done Thursday and we have her set up for the EGD on 08/01/15 @ 7:45 am. They are aware unless her blood work is elevated.

## 2015-07-29 ENCOUNTER — Ambulatory Visit (HOSPITAL_COMMUNITY): Payer: Medicare Other

## 2015-07-29 ENCOUNTER — Ambulatory Visit (HOSPITAL_COMMUNITY): Payer: Medicare Other | Admitting: Oncology

## 2015-07-29 ENCOUNTER — Other Ambulatory Visit: Payer: Self-pay | Admitting: Gastroenterology

## 2015-07-29 ENCOUNTER — Other Ambulatory Visit (HOSPITAL_COMMUNITY): Payer: Medicare Other

## 2015-07-29 LAB — BASIC METABOLIC PANEL
BUN / CREAT RATIO: 19 (ref 11–26)
BUN: 31 mg/dL — AB (ref 8–27)
CALCIUM: 9.4 mg/dL (ref 8.7–10.3)
CHLORIDE: 99 mmol/L (ref 96–106)
CO2: 17 mmol/L — ABNORMAL LOW (ref 18–29)
Creatinine, Ser: 1.65 mg/dL — ABNORMAL HIGH (ref 0.57–1.00)
GFR calc Af Amer: 32 mL/min/{1.73_m2} — ABNORMAL LOW (ref >59)
GFR calc non Af Amer: 28 mL/min/{1.73_m2} — ABNORMAL LOW (ref >59)
Glucose: 242 mg/dL — ABNORMAL HIGH (ref 65–99)
Potassium: 4.6 mmol/L (ref 3.5–5.2)
SODIUM: 135 mmol/L (ref 134–144)

## 2015-07-29 LAB — POCT I-STAT 4, (NA,K, GLUC, HGB,HCT)
Glucose, Bld: 153 mg/dL — ABNORMAL HIGH (ref 65–99)
HCT: 28 % — ABNORMAL LOW (ref 36.0–46.0)
Hemoglobin: 9.5 g/dL — ABNORMAL LOW (ref 12.0–15.0)
Potassium: 5.6 mmol/L — ABNORMAL HIGH (ref 3.5–5.1)
SODIUM: 136 mmol/L (ref 135–145)

## 2015-07-29 LAB — URINALYSIS, ROUTINE W REFLEX MICROSCOPIC
Bilirubin, UA: NEGATIVE
Ketones, UA: NEGATIVE
Leukocytes, UA: NEGATIVE
NITRITE UA: NEGATIVE
PH UA: 5.5 (ref 5.0–7.5)
Specific Gravity, UA: 1.025 (ref 1.005–1.030)
UUROB: 0.2 mg/dL (ref 0.2–1.0)

## 2015-07-29 LAB — MICROSCOPIC EXAMINATION
Crystals: NONE SEEN
Mucus, UA: NONE SEEN
RBC MICROSCOPIC, UA: NONE SEEN /HPF (ref 0–?)

## 2015-07-29 LAB — CBC WITH DIFFERENTIAL/PLATELET
BASOS ABS: 0.1 10*3/uL (ref 0.0–0.2)
Basos: 0 %
EOS (ABSOLUTE): 0.1 10*3/uL (ref 0.0–0.4)
EOS: 0 %
HEMATOCRIT: 29.7 % — AB (ref 34.0–46.6)
HEMOGLOBIN: 9.1 g/dL — AB (ref 11.1–15.9)
IMMATURE GRANS (ABS): 0 10*3/uL (ref 0.0–0.1)
Immature Granulocytes: 0 %
LYMPHS ABS: 14.8 10*3/uL — AB (ref 0.7–3.1)
LYMPHS: 75 %
MCH: 28.3 pg (ref 26.6–33.0)
MCHC: 30.6 g/dL — ABNORMAL LOW (ref 31.5–35.7)
MCV: 93 fL (ref 79–97)
MONOCYTES: 3 %
Monocytes Absolute: 0.6 10*3/uL (ref 0.1–0.9)
NEUTROS ABS: 4.5 10*3/uL (ref 1.4–7.0)
Neutrophils: 22 %
PLATELETS: 353 10*3/uL (ref 150–379)
RBC: 3.21 x10E6/uL — ABNORMAL LOW (ref 3.77–5.28)
RDW: 15.7 % — ABNORMAL HIGH (ref 12.3–15.4)
WBC: 20.2 10*3/uL (ref 3.4–10.8)

## 2015-07-29 NOTE — Progress Notes (Signed)
Quick Note:  Please let patient's daughter know that her Hgb is stable at 9.1, wbc still up (likely CLL but await u/a results), glucose 242, Potassium is normal.  EGD on Monday as scheduled. ______

## 2015-07-29 NOTE — Telephone Encounter (Signed)
Potassium is now normal. EGD on Monday as planned. See result note.

## 2015-07-29 NOTE — Telephone Encounter (Signed)
Anne [name], the pt could not give a urine sample yesterday. They are going to collect one this morning and get it to the lab. It was ordered stat, so you may get a phone call if its abnormal.

## 2015-08-01 ENCOUNTER — Ambulatory Visit (HOSPITAL_COMMUNITY)
Admission: RE | Admit: 2015-08-01 | Discharge: 2015-08-01 | Disposition: A | Discharge status: Home / Self Care | Payer: Medicare Other | Source: Ambulatory Visit | Attending: Internal Medicine | Admitting: Internal Medicine

## 2015-08-01 ENCOUNTER — Encounter (HOSPITAL_COMMUNITY)
Admission: RE | Disposition: A | Discharge status: Home / Self Care | Payer: Self-pay | Source: Ambulatory Visit | Attending: Internal Medicine

## 2015-08-01 ENCOUNTER — Encounter (HOSPITAL_COMMUNITY): Payer: Self-pay | Admitting: *Deleted

## 2015-08-01 DIAGNOSIS — Z7982 Long term (current) use of aspirin: Secondary | ICD-10-CM | POA: Diagnosis not present

## 2015-08-01 DIAGNOSIS — Z950 Presence of cardiac pacemaker: Secondary | ICD-10-CM | POA: Diagnosis not present

## 2015-08-01 DIAGNOSIS — Z951 Presence of aortocoronary bypass graft: Secondary | ICD-10-CM | POA: Insufficient documentation

## 2015-08-01 DIAGNOSIS — R110 Nausea: Secondary | ICD-10-CM | POA: Insufficient documentation

## 2015-08-01 DIAGNOSIS — R109 Unspecified abdominal pain: Secondary | ICD-10-CM | POA: Insufficient documentation

## 2015-08-01 DIAGNOSIS — Z79899 Other long term (current) drug therapy: Secondary | ICD-10-CM | POA: Diagnosis not present

## 2015-08-01 DIAGNOSIS — E1122 Type 2 diabetes mellitus with diabetic chronic kidney disease: Secondary | ICD-10-CM | POA: Diagnosis not present

## 2015-08-01 DIAGNOSIS — E039 Hypothyroidism, unspecified: Secondary | ICD-10-CM | POA: Diagnosis not present

## 2015-08-01 DIAGNOSIS — Z794 Long term (current) use of insulin: Secondary | ICD-10-CM | POA: Insufficient documentation

## 2015-08-01 DIAGNOSIS — I509 Heart failure, unspecified: Secondary | ICD-10-CM | POA: Insufficient documentation

## 2015-08-01 DIAGNOSIS — D649 Anemia, unspecified: Secondary | ICD-10-CM

## 2015-08-01 DIAGNOSIS — Z856 Personal history of leukemia: Secondary | ICD-10-CM | POA: Insufficient documentation

## 2015-08-01 DIAGNOSIS — K210 Gastro-esophageal reflux disease with esophagitis: Secondary | ICD-10-CM | POA: Diagnosis not present

## 2015-08-01 DIAGNOSIS — R634 Abnormal weight loss: Secondary | ICD-10-CM

## 2015-08-01 DIAGNOSIS — K319 Disease of stomach and duodenum, unspecified: Secondary | ICD-10-CM | POA: Diagnosis not present

## 2015-08-01 DIAGNOSIS — N183 Chronic kidney disease, stage 3 (moderate): Secondary | ICD-10-CM | POA: Insufficient documentation

## 2015-08-01 DIAGNOSIS — E7800 Pure hypercholesterolemia, unspecified: Secondary | ICD-10-CM | POA: Diagnosis not present

## 2015-08-01 DIAGNOSIS — K921 Melena: Secondary | ICD-10-CM | POA: Insufficient documentation

## 2015-08-01 DIAGNOSIS — K3189 Other diseases of stomach and duodenum: Secondary | ICD-10-CM | POA: Diagnosis not present

## 2015-08-01 DIAGNOSIS — I130 Hypertensive heart and chronic kidney disease with heart failure and stage 1 through stage 4 chronic kidney disease, or unspecified chronic kidney disease: Secondary | ICD-10-CM | POA: Diagnosis not present

## 2015-08-01 DIAGNOSIS — I2510 Atherosclerotic heart disease of native coronary artery without angina pectoris: Secondary | ICD-10-CM | POA: Insufficient documentation

## 2015-08-01 HISTORY — PX: ESOPHAGOGASTRODUODENOSCOPY: SHX5428

## 2015-08-01 LAB — GLUCOSE, CAPILLARY: Glucose-Capillary: 232 mg/dL — ABNORMAL HIGH (ref 65–99)

## 2015-08-01 LAB — HEMOGLOBIN AND HEMATOCRIT, BLOOD
HEMATOCRIT: 27.1 % — AB (ref 36.0–46.0)
HEMOGLOBIN: 8.6 g/dL — AB (ref 12.0–15.0)

## 2015-08-01 SURGERY — EGD (ESOPHAGOGASTRODUODENOSCOPY)
Anesthesia: Moderate Sedation

## 2015-08-01 MED ORDER — MEPERIDINE HCL 100 MG/ML IJ SOLN
INTRAMUSCULAR | Status: AC
  Filled 2015-08-01: qty 2

## 2015-08-01 MED ORDER — STERILE WATER FOR IRRIGATION IR SOLN
Status: DC | PRN
  Administered 2015-08-01: 08:00:00

## 2015-08-01 MED ORDER — LIDOCAINE VISCOUS 2 % MT SOLN
OROMUCOSAL | Status: AC
  Filled 2015-08-01: qty 15

## 2015-08-01 MED ORDER — LIDOCAINE VISCOUS 2 % MT SOLN
OROMUCOSAL | Status: DC | PRN
  Administered 2015-08-01: 3 mL via OROMUCOSAL

## 2015-08-01 MED ORDER — SODIUM CHLORIDE 0.9 % IV SOLN
INTRAVENOUS | Status: DC
  Administered 2015-08-01: 1000 mL via INTRAVENOUS

## 2015-08-01 MED ORDER — MIDAZOLAM HCL 5 MG/5ML IJ SOLN
INTRAMUSCULAR | Status: DC | PRN
  Administered 2015-08-01: 0.5 mg via INTRAVENOUS
  Administered 2015-08-01: 1 mg via INTRAVENOUS

## 2015-08-01 MED ORDER — ONDANSETRON HCL 4 MG/2ML IJ SOLN
INTRAMUSCULAR | Status: AC
  Filled 2015-08-01: qty 2

## 2015-08-01 MED ORDER — ONDANSETRON HCL 4 MG/2ML IJ SOLN
INTRAMUSCULAR | Status: DC | PRN
  Administered 2015-08-01: 4 mg via INTRAVENOUS

## 2015-08-01 MED ORDER — MIDAZOLAM HCL 5 MG/5ML IJ SOLN
INTRAMUSCULAR | Status: AC
  Filled 2015-08-01: qty 10

## 2015-08-01 MED ORDER — MEPERIDINE HCL 100 MG/ML IJ SOLN
INTRAMUSCULAR | Status: DC | PRN
  Administered 2015-08-01: 25 mg via INTRAVENOUS

## 2015-08-01 NOTE — Discharge Instructions (Signed)
EGD Discharge instructions Please read the instructions outlined below and refer to this sheet in the next few weeks. These discharge instructions provide you with general information on caring for yourself after you leave the hospital. Your doctor may also give you specific instructions. While your treatment has been planned according to the most current medical practices available, unavoidable complications occasionally occur. If you have any problems or questions after discharge, please call your doctor. ACTIVITY  You may resume your regular activity but move at a slower pace for the next 24 hours.   Take frequent rest periods for the next 24 hours.   Walking will help expel (get rid of) the air and reduce the bloated feeling in your abdomen.   No driving for 24 hours (because of the anesthesia (medicine) used during the test).   You may shower.   Do not sign any important legal documents or operate any machinery for 24 hours (because of the anesthesia used during the test).  NUTRITION  Drink plenty of fluids.   You may resume your normal diet.   Begin with a light meal and progress to your normal diet.   Avoid alcoholic beverages for 24 hours or as instructed by your caregiver.  MEDICATIONS  You may resume your normal medications unless your caregiver tells you otherwise.  WHAT YOU CAN EXPECT TODAY  You may experience abdominal discomfort such as a feeling of fullness or gas pains.  FOLLOW-UP  Your doctor will discuss the results of your test with you.  SEEK IMMEDIATE MEDICAL ATTENTION IF ANY OF THE FOLLOWING OCCUR:  Excessive nausea (feeling sick to your stomach) and/or vomiting.   Severe abdominal pain and distention (swelling).   Trouble swallowing.   Temperature over 101 F (37.8 C).   Rectal bleeding or vomiting of blood.    Further recommendations to follow pending results of laboratory evaluation  H&H today

## 2015-08-01 NOTE — Op Note (Addendum)
[facility] [address] [place], [address]   ENDOSCOPY PROCEDURE REPORT  PATIENT: Anne, [name]  MR#: [id number] BIRTHDATE: 06/08/1927 , 5  yrs. old GENDER: female ENDOSCOPIST: R.  Garfield Cornea, MD FACP FACG REFERRED BY:  [name], M.D.  Ancil Linsey, MD PROCEDURE DATE:  08/19/2015 PROCEDURE:  EGD w/ biopsy INDICATIONS:  right-sided abdominal pain, abnormal stomach on CT, reported melena. MEDICATIONS: Versed 1.5 mg IV and Demerol 25 mg IV in divided doses. Xylocaine gel orally.  Zofran 4 mg IV. ASA CLASS:      Class III  CONSENT: The risks, benefits, limitations, alternatives and imponderables have been discussed.  The potential for biopsy, esophogeal dilation, etc. have also been reviewed.  Questions have been answered.  All parties agreeable.  Please see the history and physical in the medical record for more information.  DESCRIPTION OF PROCEDURE: After the risks benefits and alternatives of the procedure were thoroughly explained, informed consent was obtained.  The [id number] endoscope was introduced through the mouth and advanced to the second portion of the duodenum , limited by Without limitations. The instrument was slowly withdrawn as the mucosa was fully examined. Estimated blood loss is zero unless otherwise noted in this procedure report.    Couple of tiny distal esophageal erosions; otherwise, esophageal mucosa appeared unremarkable.  Stomach empty.  Examination of the gastric mucosa revealed mottling of the gastric mucosa from the body down through the antrum.  There was no ulcer or infiltrating process seen.  Pylorus patent.  Normal-appearing first and second portion of the duodenum.  Biopsies of the abnormal gastric mucosa taken.  Retroflexed views revealed no abnormalities.     The scope was then withdrawn from the patient and the procedure completed.  COMPLICATIONS: There were no immediate complications.  ENDOSCOPIC  IMPRESSION: Very mild erosive reflux esophagitis.  Abnormal gastric mucosa of uncertain significance. No infiltrating process or tumor seen?"status post biopsy  RECOMMENDATIONS: Follow up on pathology. Follow-up on urine culture. Further GI evaluation may be needed as discussed with patient's daughter   REPEAT EXAM:  eSigned:  R. Garfield Cornea, MD Rosalita Chessman [facility] 08/19/15 8:07 AM    CC:  CPT CODES: ICD CODES:  The ICD and CPT codes recommended by this software are interpretations from the data that the clinical staff has captured with the software.  The verification of the translation of this report to the ICD and CPT codes and modifiers is the sole responsibility of the health care institution and practicing physician where this report was generated.  [employer]. will not be held responsible for the validity of the ICD and CPT codes included on this report.  AMA assumes no liability for data contained or not contained herein. CPT is a Designer, television/film set of the [employer].  PATIENT NAME:  Anne, [name] MR#: [id number]

## 2015-08-01 NOTE — Interval H&P Note (Signed)
History and Physical Interval Note:  08/01/2015 7:32 AM  Anne [name]  has presented today for surgery, with the diagnosis of anemia/abnormal weight loss  The various methods of treatment have been discussed with the patient and family. After consideration of risks, benefits and other options for treatment, the patient has consented to  Procedure(s) with comments: ESOPHAGOGASTRODUODENOSCOPY (EGD) (N/A) - 745 as a surgical intervention .  The patient's history has been reviewed, patient examined, no change in status, stable for surgery.  I have reviewed the patient's chart and labs.  Questions were answered to the patient's satisfaction.     Anne [name]  Repeat K normal; urine culture pending. Dx EGD per plan.  The risks, benefits, limitations, alternatives and imponderables have been reviewed with the patient. Potential for esophageal dilation, biopsy, etc. have also been reviewed.  Questions have been answered. All parties agreeable.

## 2015-08-01 NOTE — H&P (View-Only) (Signed)
Primary Care Physician:  Wende Neighbors, MD  Primary Gastroenterologist:  Garfield Cornea, MD   Chief Complaint  Patient presents with  . black stools  . Emesis  . Nausea    HPI:  Anne [name] is a 82 y.o. female with history of CLL followed by [facility], element of anemia felt to be multifactorial from anemia of chronic disease and/or CLL, receives Epogen as needed who presents at the request of Dr. Wende Neighbors for further evaluation of N/V, melena.   Epogen 07/07/2015. Weight is down about 10 pounds since December. 2-3 weeks of melena, diminished appetite. Vomiting intermittent, none in the last couple of weeks. Has usually been in car and when not at home. No hematemesis. 50% decreased activity level. Increased weakness. Daughter is retired Haematologist. States she has seen black, sticky stools are couple of weeks. Initially 2-3 per day, now 1-2 per day. Has been on iron chronically. No Pepto.   Saw Dr. Nevada Crane, noted decline in her Hgb to 9.6 from 10.4 when checked last week. Patient has some dementia. She denies abdominal pain, dysphagia, brbpr.  Abd ct with contrast 2016 with nonspecific mild thickening of pyloric wall without outlet obstruction  Current Outpatient Prescriptions  Medication Sig Dispense Refill  . aspirin 81 MG EC tablet Take 81 mg by mouth daily.     . carvedilol (COREG) 6.25 MG tablet TAKE 1 TABLET BY MOUTH TWICE DAILY WITH A MEAL 180 tablet 3  . citalopram (CELEXA) 10 MG tablet Take 10 mg by mouth 2 (two) times daily.    Marland Kitchen DIOVAN 160 MG tablet Take 80-160 mg by mouth daily. Take and additional 1/2 tab on M, W, F nights    . Docusate Calcium (STOOL SOFTENER PO) Take 1 capsule by mouth daily.     Marland Kitchen donepezil (ARICEPT) 5 MG tablet Take 1 tablet by mouth at bedtime.   5  . fish oil-omega-3 fatty acids 1000 MG capsule Take 1 g by mouth daily.     Marland Kitchen HUMALOG KWIKPEN 100 UNIT/ML KiwkPen Inject 0-12 Units into the skin every evening. Reported on 05/26/2015  5  .  HYDROcodone-acetaminophen (NORCO/VICODIN) 5-325 MG per tablet Take 1 tablet by mouth 3 (three) times daily.  0  . IRON CR PO Take 1 tablet by mouth every Monday, Wednesday, and Friday.     Marland Kitchen LEVEMIR FLEXTOUCH 100 UNIT/ML Pen Inject 25 Units into the skin every morning.   5  . LORazepam (ATIVAN) 0.5 MG tablet Take 0.5 mg by mouth every 8 (eight) hours as needed. FOR NERVES    . metFORMIN (GLUCOPHAGE) 1000 MG tablet Take 1,000 mg by mouth 2 (two) times daily with a meal.     . NAMENDA 10 MG tablet Take 10 mg by mouth 2 (two) times daily.     . ondansetron (ZOFRAN ODT) 4 MG disintegrating tablet 4mg  ODT q4 hours prn nausea/vomit 12 tablet 0  . pravastatin (PRAVACHOL) 40 MG tablet Take 1 tablet (40 mg total) by mouth daily. 90 tablet 2  . TRADJENTA 5 MG TABS tablet Take 1 tablet by mouth daily.  5  . traMADol (ULTRAM) 50 MG tablet Take 50 mg by mouth daily as needed for moderate pain or severe pain.     . Multiple Vitamins-Minerals (CENTRUM SILVER PO) Take 1 tablet by mouth daily.      No current facility-administered medications for this visit.    Allergies as of 07/25/2015 - Review Complete 07/25/2015  Allergen Reaction Noted  .  Lisinopril  04/02/2007    Past Medical History  Diagnosis Date  . CLL (chronic lymphocytic leukemia) (HCC)     stable  . Anemia   . DM (diabetes mellitus) ([place])   . Hypercholesteremia   . DJD (degenerative joint disease) of knee     bilat  . Hypertension   . Shortness of breath   . CHF (congestive heart failure) (HCC)     [place] Class 3 Heart Failure  . Blood transfusion   . CLL (chronic lymphocytic leukemia) ([place])   . CLL (chronic lymphocytic leukemia) ([place]) 04/03/2007    Stage 0 CLL    . Anemia of chronic disease 10/30/2011  . Ischemic cardiomyopathy     EF of 35% on 2D Echo done 04/07/12 moderate intraventricular dyssynchrony, moderate MR and moderate to severe TR with an RVSP of 31  . Coronary artery disease     three-vessel CAD with CABG -- x5 -- left  internal mammary artery to LAD, sequential saphenous vein graft to first diagonal and first obtuse marginal, sequential saphenous vein graft to posterior descending and obtuse marginal 2 (posterolateral), endoscopic vein harvest of righ leg  . Anemia of chronic renal failure, stage 3 (moderate) 10/30/2011  . Hypothyroidism   . Memory loss     Past Surgical History  Procedure Laterality Date  . Coronary artery bypass graft  06/22/2009    x5 -- left internal mammary artery to LAD, sequential saphenous vein graft to first diagonal and first obtuse marginal, sequential saphenous vein graft to posterior descending and obtuse marginal 2 (posterolateral), endoscopic vein harvest of righ leg  . Goiter resection    . Thyroidectomy  1970s  . Cataract extraction, bilateral    . Rectal abscess    . Replacement total knee bilateral      bilateral  . Cardiac catheterization  01/18/2011    Est. EF of 25% to 30 - Mildly reduced cardiac output with no signs of decompensated heart failure and normal right heart filling pressures -- Patent bypass grafts with underlying three-vessel coronary artery disease  . Cardiac catheterization  06/14/2009    Est. EF of of 45% - Multivessel coronary artery disease with high-grade lesions in the LAD, left circumflex, and RCA -- Mildly reduced left ventricular function -- No significant aortic stenosis or mitral regurgitation  . Pacemaker insertion  04/16/2011    biventricular pacemaker insertion  . Eye surgery    . Steroid injections to sacral spine 01/28/13 - dr Brien Few  01/28/13  . Skin graft  09/2013    nose  . Bi-ventricular pacemaker insertion N/A 04/16/2011    Procedure: BI-VENTRICULAR PACEMAKER INSERTION (CRT-P);  Surgeon: Thompson Grayer, MD;  Location: [facility] CATH LAB;  Service: Cardiovascular;  Laterality: N/A;    Family History  Problem Relation Age of Onset  . Heart failure Brother 82  . Heart attack Brother 62  . Heart disease Son 69    Had CABG x 1  . Hypertension  Son     Social History   Social History  . Marital Status: Widowed    Spouse Name: N/A  . Number of Children: 3  . Years of Education: N/A   Occupational History  .     Social History Main Topics  . Smoking status: Never Smoker   . Smokeless tobacco: Never Used  . Alcohol Use: No  . Drug Use: No  . Sexual Activity: No   Other Topics Concern  . Not on file   Social History Narrative  [employer] in [place].  Systems analyst prior to retiring.      ROS: unreliable. States she is not having any problems  General: Negative for anorexia, weight loss, fever, chills, fatigue, weakness. Eyes: Negative for vision changes.  ENT: Negative for hoarseness, difficulty swallowing , nasal congestion. CV: Negative for chest pain, angina, palpitations, dyspnea on exertion, peripheral edema.  Respiratory: Negative for dyspnea at rest, dyspnea on exertion, cough, sputum, wheezing.  GI: See history of present illness. GU:  Negative for dysuria, hematuria, urinary incontinence, urinary frequency, nocturnal urination.  MS: Negative for joint pain, low back pain.  Derm: Negative for rash or itching.  Neuro: Negative for weakness, abnormal sensation, seizure, frequent headaches, memory loss, confusion.  Psych: Negative for anxiety, depression, suicidal ideation, hallucinations.  Endo: Negative for unusual weight change.  Heme: Negative for bruising or bleeding. Allergy: Negative for rash or hives.    Physical Examination:  BP 131/76 mmHg  Pulse 96  Temp(Src) 97.5 F (36.4 C)  Ht 5\' 3"  (1.6 m)  Wt 143 lb 6.4 oz (65.046 kg)  BMI 25.41 kg/m2   General: Well-nourished, well-developed in no acute distress. accompanied by daughter.  Head: Normocephalic, atraumatic.   Eyes: Conjunctiva pink, no icterus. Mouth: Oropharyngeal mucosa moist and pink , no lesions erythema or exudate. Neck: Supple without thyromegaly, masses, or lymphadenopathy.  Lungs: Clear to auscultation bilaterally.   Heart: Regular rate and rhythm, no murmurs rubs or gallops.  Abdomen: Bowel sounds are normal, right upper/right mid abd tenderness, nondistended, no hepatosplenomegaly or masses, no abdominal bruits or    hernia , no rebound or guarding.   Rectal: not performed Extremities: No lower extremity edema. No clubbing or deformities.  Neuro: Alert and oriented x 4 , grossly normal neurologically.  Skin: Warm and dry, no rash or jaundice.   Psych: Alert and cooperative, normal mood and affect.  Labs: Labs from 07/19/2015 White blood cell count 19,200, hemoglobin 9.6, hematocrit 30.7, MCV 91.1, platelets 354,000  Hemoglobin was 10.4 on 06/09/2015  Lab Results  Component Value Date   FERRITIN 295 07/07/2015   Lab Results  Component Value Date   CREATININE 1.15* 04/01/2015   BUN 30* 04/01/2015   NA 138 04/01/2015   K 5.0 04/01/2015   CL 107 04/01/2015   CO2 25 04/01/2015   Lab Results  Component Value Date   ALT 12* 04/01/2015   AST 21 04/01/2015   ALKPHOS 52 04/01/2015   BILITOT 0.7 04/01/2015    Imaging Studies: US Arterial Seg Multiple  06/27/2015  CLINICAL DATA:  82 year old female with diminished lower extremity pulses EXAM: NONINVASIVE PHYSIOLOGIC VASCULAR STUDY OF BILATERAL LOWER EXTREMITIES TECHNIQUE: Evaluation of both lower extremities was performed at rest, including calculation of ankle-brachial indices, multiple segmental pressure evaluation, segmental Doppler and segmental pulse volume recording. COMPARISON:  None. FINDINGS: Right ABI:  1.3 Right toe brachial index:  0.65 Left ABI:  1.4 Left toe brachial index:  0.22 Right Lower Extremity: Limited segmental pressures secondary to incompressibility of the thigh and calf arteries. This may be related to patient body habitus. Arterial waveforms appear triphasic to the level of the ankle. Pulse volume recordings are relatively preserved. Left Lower Extremity: Limited segmental pressures secondary to incompressibility of the  thigh and calf arteries likely related to patient body habitus. Abnormal popliteal, posterior tibial and dorsalis pedis waveforms suggesting runoff disease. Additionally, significantly diminished waveforms in the metatarsal and digital arteries consistent with small vessels disease. IMPRESSION: 1. No evidence of hemodynamically significant peripheral arterial  disease on the right. 2. Although the left ankle brachial index is at the upper limits of normal at 1.4 this may be falsely elevated secondary to medial arterial sclerosis. The arterial waveforms and toe brachial index suggest moderate to severe peripheral arterial disease likely secondary to a combination of femoral popliteal and small vessel disease. Signed, Criselda Peaches, MD [facility] Radiology Electronically Signed   By: Jacqulynn Cadet M.D.   On: 06/27/2015 16:07

## 2015-08-01 NOTE — Progress Notes (Signed)
Quick Note:  Please let patient's daughter know there was no signs of infection on her u/a. Please find out if and when patient ever had a colonoscopy. ______

## 2015-08-01 NOTE — Telephone Encounter (Signed)
See result note.  

## 2015-08-03 ENCOUNTER — Encounter (HOSPITAL_COMMUNITY): Payer: Self-pay | Admitting: Internal Medicine

## 2015-08-03 NOTE — Progress Notes (Signed)
Wende Neighbors, MD [place] [id number]  CLL (chronic lymphocytic leukemia) [facility]) - Plan: CBC with Differential, CBC with Differential, Comprehensive metabolic panel, Lactate dehydrogenase, Sedimentation rate  Anemia of chronic renal failure, stage 3 (moderate) - Plan: CBC with Differential, Ferritin, CBC with Differential, Comprehensive metabolic panel, Sedimentation rate  CURRENT THERAPY: Procrit 40,000 since 12/2010, once monthly  INTERVAL HISTORY: Anne [name] 82 y.o. female returns for followup of CLL with an element of anemia that is likely multifactorial from anemia of chronic renal disease and/or CLL.    Patient/daughter call the clinic canceling all appointments as the patient would like to move on with hospice and not pursue any further intervention.  The patient was not seen today.  The patient's case was discussed with Dr. Nevada Crane, primary care provider. We reviewed the patient's case. From a hematology perspective, I do not think she has 6 months or less of life. I think she'll of longer than this. However, the patient does have dementia. Without Procrit, her anemia may be progressive and worsening. Only time will tell.  All appointments are canceled.  Past Medical History  Diagnosis Date  . CLL (chronic lymphocytic leukemia) (HCC)     stable  . Anemia   . DM (diabetes mellitus) ([place])   . Hypercholesteremia   . DJD (degenerative joint disease) of knee     bilat  . Hypertension   . Shortness of breath   . CHF (congestive heart failure) (HCC)     [place] Class 3 Heart Failure  . Blood transfusion   . CLL (chronic lymphocytic leukemia) ([place])   . CLL (chronic lymphocytic leukemia) ([place]) 04/03/2007    Stage 0 CLL    . Anemia of chronic disease 10/30/2011  . Ischemic cardiomyopathy     EF of 35% on 2D Echo done 04/07/12 moderate intraventricular dyssynchrony, moderate MR and moderate to severe TR with an RVSP of 31  . Coronary artery disease    three-vessel CAD with CABG -- x5 -- left internal mammary artery to LAD, sequential saphenous vein graft to first diagonal and first obtuse marginal, sequential saphenous vein graft to posterior descending and obtuse marginal 2 (posterolateral), endoscopic vein harvest of righ leg  . Anemia of chronic renal failure, stage 3 (moderate) 10/30/2011  . Hypothyroidism   . Memory loss     has CLL (chronic lymphocytic leukemia) ([place]); HYPOTHYROIDISM; DIABETES MELLITUS, TYPE II, UNCONTROLLED; Hyperlipidemia; DEPRESSION; CATARACTS; Essential hypertension; ARTHRITIS; LOW BACK PAIN, CHRONIC; BURSITIS, ACROMIOCLAVICULAR, LEFT; MEMORY LOSS; FECAL INCONTINENCE; Ischemic cardiomyopathy; Chronic systolic dysfunction of left ventricle; Anemia of chronic renal failure, stage 3 (moderate); Pacemaker-St.Jude; Coronary artery disease; S/P CABG x 5; Melena; Anorexia; Abnormal weight loss; Normocytic anemia; and Mucosal abnormality of stomach on her problem list.     is allergic to lisinopril.  Ms. Delira does not currently have medications on file.  Past Surgical History  Procedure Laterality Date  . Coronary artery bypass graft  06/22/2009    x5 -- left internal mammary artery to LAD, sequential saphenous vein graft to first diagonal and first obtuse marginal, sequential saphenous vein graft to posterior descending and obtuse marginal 2 (posterolateral), endoscopic vein harvest of righ leg  . Goiter resection    . Thyroidectomy  1970s  . Cataract extraction, bilateral    . Rectal abscess    . Replacement total knee bilateral      bilateral  . Cardiac catheterization  01/18/2011    Est. EF  of 25% to 30 - Mildly reduced cardiac output with no signs of decompensated heart failure and normal right heart filling pressures -- Patent bypass grafts with underlying three-vessel coronary artery disease  . Cardiac catheterization  06/14/2009    Est. EF of of 45% - Multivessel coronary artery disease with high-grade lesions in  the LAD, left circumflex, and RCA -- Mildly reduced left ventricular function -- No significant aortic stenosis or mitral regurgitation  . Pacemaker insertion  04/16/2011    biventricular pacemaker insertion  . Eye surgery    . Steroid injections to sacral spine 01/28/13 - dr Brien Few  01/28/13  . Skin graft  09/2013    nose  . Bi-ventricular pacemaker insertion N/A 04/16/2011    Procedure: BI-VENTRICULAR PACEMAKER INSERTION (CRT-P);  Surgeon: Thompson Grayer, MD;  Location: [facility] CATH LAB;  Service: Cardiovascular;  Laterality: N/A;  . Esophagogastroduodenoscopy N/A 08/01/2015    Procedure: ESOPHAGOGASTRODUODENOSCOPY (EGD);  Surgeon: Daneil Dolin, MD;  Location: [facility] ENDO [facility];  Service: Endoscopy;  Laterality: N/A;  745    Denies any headaches, dizziness, double vision, fevers, chills, night sweats, nausea, vomiting, diarrhea, constipation, chest pain, heart palpitations, shortness of breath, blood in stool, black tarry stool, urinary pain, urinary burning, urinary frequency, hematuria.   PHYSICAL EXAMINATION  ECOG PERFORMANCE STATUS: 1 - Symptomatic but completely ambulatory  There were no vitals filed for this visit.  Patient not seen today.  LABORATORY DATA: CBC    Component Value Date/Time   WBC 20.2* 07/28/2015 1640   WBC 14.2* 06/09/2015 1043   RBC 3.21* 07/28/2015 1640   RBC 3.37* 06/09/2015 1043   RBC 3.90 02/15/2014 1000   HGB 8.6* 08/01/2015 0833   HCT 27.1* 08/01/2015 0833   HCT 29.7* 07/28/2015 1640   PLT 353 07/28/2015 1640   PLT 209 06/09/2015 1043   MCV 93 07/28/2015 1640   MCV 93.2 06/09/2015 1043   MCH 28.3 07/28/2015 1640   MCH 30.9 06/09/2015 1043   MCHC 30.6* 07/28/2015 1640   MCHC 33.1 06/09/2015 1043   RDW 15.7* 07/28/2015 1640   RDW 14.6 06/09/2015 1043   LYMPHSABS 14.8* 07/28/2015 1640   LYMPHSABS 11.1* 06/09/2015 1043   MONOABS 0.3 06/09/2015 1043   EOSABS 0.1 07/28/2015 1640   EOSABS 0.1 06/09/2015 1043   BASOSABS 0.1 07/28/2015 1640   BASOSABS 0.0  06/09/2015 1043      Chemistry      Component Value Date/Time   NA 135 07/28/2015 1640   NA 136 07/26/2015 0838   K 4.6 07/28/2015 1640   CL 99 07/28/2015 1640   CO2 17* 07/28/2015 1640   BUN 31* 07/28/2015 1640   BUN 30* 04/01/2015 1001   CREATININE 1.65* 07/28/2015 1640   CREATININE 1.07 04/10/2011 1027      Component Value Date/Time   CALCIUM 9.4 07/28/2015 1640   ALKPHOS 61 07/25/2015 1055   AST 27 07/25/2015 1055   ALT 7 07/25/2015 1055   BILITOT 0.5 07/25/2015 1055   BILITOT 0.7 04/01/2015 1001     Lab Results  Component Value Date   FERRITIN 295 07/07/2015     RADIOGRAPHIC STUDIES:  No results found.    ASSESSMENT AND PLAN:  CLL (chronic lymphocytic leukemia) ([place]) 82 year old female with long-standing CLL, and multifactorial anemia (CLL and renal disease). She had a bone marrow biopsy many years back. She has had a long-standing anemia that is felt to be secondary to her chronic kidney disease. She is on Procrit 40,000 units monthly with stability  of her anemia. Her daughter states they do not want to discontinue that, as her blood counts have been stable for some time. Additionally, she is not interested in additional evaluation of anemia. We will therefore continue with the plan as scheduled.  Patient/daughter called canceling all appointments. The patient is no longer interested in pursuing Procrit intervention. Progress been used for her anemia which is suspected be secondary to chronic renal disease. There is no doubt that her CLL is also playing a part in her anemia. We've canceled all points. She did not come the clinic today. Patient was not seen today.  I discussed the patient's case with Dr. Nevada Crane, her primary care physician. From a hematology standpoint, there is a high likelihood that she will live past 6 months. However she has other comorbidities.  From a hematology standpoint, I suspect she will live past 6 months with regards to her CLL.    I think  the patient will need to be evaluated on a monthly basis and if progression of her performance status is noted or she begins to fail to thrive, she would be a candidate for hospice intervention.  She is not a candidate for CLL treatment and historically, has refused further interventions.    THERAPY PLAN:  She is to follow-up with her primary care physician, Dr. Nevada Crane, as directed.  All questions were answered. The patient knows to call the clinic with any problems, questions or concerns. We can certainly see the patient much sooner if necessary.  Patient and plan discussed with Dr. Ancil Linsey and she is in agreement with the aforementioned.   This note is electronically signed by: Robynn Pane 08/05/2015 3:44 PM

## 2015-08-03 NOTE — Assessment & Plan Note (Addendum)
82 year old female with long-standing CLL, and multifactorial anemia (CLL and renal disease). She had a bone marrow biopsy many years back. She has had a long-standing anemia that is felt to be secondary to her chronic kidney disease. She is on Procrit 40,000 units monthly with stability of her anemia. Her daughter states they do not want to discontinue that, as her blood counts have been stable for some time. Additionally, she is not interested in additional evaluation of anemia. We will therefore continue with the plan as scheduled.  Patient/daughter called canceling all appointments. The patient is no longer interested in pursuing Procrit intervention. Progress been used for her anemia which is suspected be secondary to chronic renal disease. There is no doubt that her CLL is also playing a part in her anemia. We've canceled all points. She did not come the clinic today. Patient was not seen today.  I discussed the patient's case with Dr. Nevada Crane, her primary care physician. From a hematology standpoint, there is a high likelihood that she will live past 6 months. However she has other comorbidities.  From a hematology standpoint, I suspect she will live past 6 months with regards to her CLL.    I think the patient will need to be evaluated on a monthly basis and if progression of her performance status is noted or she begins to fail to thrive, she would be a candidate for hospice intervention.  She is not a candidate for CLL treatment and historically, has refused further interventions.

## 2015-08-04 ENCOUNTER — Encounter: Payer: Self-pay | Admitting: Internal Medicine

## 2015-08-04 ENCOUNTER — Encounter (HOSPITAL_COMMUNITY): Payer: Medicare Other

## 2015-08-04 ENCOUNTER — Telehealth: Payer: Self-pay

## 2015-08-04 ENCOUNTER — Other Ambulatory Visit (HOSPITAL_COMMUNITY): Payer: Medicare Other

## 2015-08-04 ENCOUNTER — Ambulatory Visit (HOSPITAL_COMMUNITY): Payer: Medicare Other | Admitting: Oncology

## 2015-08-04 NOTE — Telephone Encounter (Signed)
Pt's daughter([phone])called to inform us that she will not be coming back to the doctor anymore. She also wanted to let LSL know that she is thankful for her help.

## 2015-08-05 ENCOUNTER — Telehealth: Payer: Self-pay

## 2015-08-05 ENCOUNTER — Other Ambulatory Visit (HOSPITAL_COMMUNITY): Payer: Self-pay | Admitting: Oncology

## 2015-08-05 NOTE — Telephone Encounter (Signed)
Letter mailed to the pt. 

## 2015-08-05 NOTE — Telephone Encounter (Signed)
Per RMR- Send letter to patient.  Send copy of letter with path to referring provider and PCP. Pt needs ov in 4 weeks.

## 2015-08-08 ENCOUNTER — Encounter: Payer: Medicare Other | Admitting: Surgery

## 2015-08-08 ENCOUNTER — Encounter (HOSPITAL_COMMUNITY): Payer: Medicare Other

## 2015-08-08 ENCOUNTER — Encounter: Payer: Self-pay | Admitting: Internal Medicine

## 2015-08-08 NOTE — Telephone Encounter (Signed)
Noted. FYI Dr. Gala Romney.

## 2015-08-08 NOTE — Telephone Encounter (Signed)
APPT MADE AND LETTER SENT  °

## 2015-08-09 NOTE — Progress Notes (Signed)
Quick Note:  See telephone encounter for 08/04/15 ______

## 2015-08-12 ENCOUNTER — Telehealth: Payer: Self-pay | Admitting: Internal Medicine

## 2015-08-12 NOTE — Telephone Encounter (Signed)
Spoke with pt's daughter- patient is in hospice care with <2 weeks expected. Remote monitoring device unplugged per family request. No further office visits needed. I will make a note in [employer] so the family doesn't receive any follow-up requests.

## 2015-08-12 NOTE — Telephone Encounter (Signed)
New Message  Pt dtr calling to speak w/ Device- pt has entered hospice care. Please call back and discuss.

## 2015-08-12 NOTE — Telephone Encounter (Signed)
LMOM to return call.

## 2015-08-15 NOTE — Telephone Encounter (Signed)
Communication noted.  

## 2015-08-27 DEATH — deceased

## 2015-09-12 ENCOUNTER — Ambulatory Visit: Payer: Medicare Other | Admitting: Gastroenterology

## 2015-10-07 ENCOUNTER — Encounter: Payer: Self-pay | Admitting: Gastroenterology

## 2018-09-04 ENCOUNTER — Encounter (HOSPITAL_COMMUNITY): Payer: Self-pay | Admitting: Oncology
